# Patient Record
Sex: Male | Born: 1940 | Race: White | Hispanic: No | Marital: Married | Smoking: Former smoker
Health system: Southern US, Community
[De-identification: ages and names within clinical notes are randomized; demographics above are authoritative.]

## PROBLEM LIST (undated history)

## (undated) DIAGNOSIS — E785 Hyperlipidemia, unspecified: Secondary | ICD-10-CM

## (undated) DIAGNOSIS — I10 Essential (primary) hypertension: Secondary | ICD-10-CM

## (undated) DIAGNOSIS — I2510 Atherosclerotic heart disease of native coronary artery without angina pectoris: Secondary | ICD-10-CM

## (undated) DIAGNOSIS — M549 Dorsalgia, unspecified: Secondary | ICD-10-CM

## (undated) DIAGNOSIS — Z8719 Personal history of other diseases of the digestive system: Secondary | ICD-10-CM

## (undated) DIAGNOSIS — E119 Type 2 diabetes mellitus without complications: Secondary | ICD-10-CM

## (undated) DIAGNOSIS — K219 Gastro-esophageal reflux disease without esophagitis: Secondary | ICD-10-CM

## (undated) DIAGNOSIS — M1990 Unspecified osteoarthritis, unspecified site: Secondary | ICD-10-CM

## (undated) DIAGNOSIS — G4730 Sleep apnea, unspecified: Secondary | ICD-10-CM

## (undated) HISTORY — PX: COLONOSCOPY WITH ESOPHAGOGASTRODUODENOSCOPY (EGD): SHX5779

## (undated) HISTORY — PX: TOTAL KNEE ARTHROPLASTY: SHX125

## (undated) HISTORY — PX: CARDIAC CATHETERIZATION: SHX172

## (undated) HISTORY — PX: OTHER SURGICAL HISTORY: SHX169

## (undated) HISTORY — PX: TONSILLECTOMY: SUR1361

## (undated) HISTORY — PX: EYE SURGERY: SHX253

## (undated) HISTORY — PX: APPENDECTOMY: SHX54

## (undated) HISTORY — PX: NISSEN FUNDOPLICATION: SHX2091

## (undated) HISTORY — PX: CHOLECYSTECTOMY: SHX55

## (undated) HISTORY — PX: HERNIA REPAIR: SHX51

---

## 2005-10-06 ENCOUNTER — Ambulatory Visit: Payer: Self-pay | Admitting: Gastroenterology

## 2007-01-07 ENCOUNTER — Ambulatory Visit: Payer: Self-pay | Admitting: Internal Medicine

## 2007-01-17 ENCOUNTER — Inpatient Hospital Stay: Payer: Self-pay | Admitting: Cardiology

## 2007-01-17 ENCOUNTER — Other Ambulatory Visit: Payer: Self-pay

## 2007-02-16 ENCOUNTER — Ambulatory Visit: Payer: Self-pay | Admitting: Internal Medicine

## 2007-03-02 ENCOUNTER — Encounter: Payer: Self-pay | Admitting: Cardiology

## 2007-03-10 ENCOUNTER — Encounter: Payer: Self-pay | Admitting: Cardiology

## 2007-03-18 ENCOUNTER — Ambulatory Visit: Payer: Self-pay | Admitting: Gastroenterology

## 2007-04-10 ENCOUNTER — Encounter: Payer: Self-pay | Admitting: Cardiology

## 2007-05-10 ENCOUNTER — Encounter: Payer: Self-pay | Admitting: Cardiology

## 2007-06-10 ENCOUNTER — Encounter: Payer: Self-pay | Admitting: Cardiology

## 2008-08-29 ENCOUNTER — Ambulatory Visit: Payer: Self-pay | Admitting: Cardiology

## 2009-05-09 DEATH — deceased

## 2009-07-19 ENCOUNTER — Encounter: Payer: Self-pay | Admitting: Orthopedic Surgery

## 2009-08-09 ENCOUNTER — Encounter: Payer: Self-pay | Admitting: Orthopedic Surgery

## 2009-09-09 ENCOUNTER — Encounter: Payer: Self-pay | Admitting: Orthopedic Surgery

## 2010-09-08 ENCOUNTER — Encounter: Payer: Self-pay | Admitting: Orthopedic Surgery

## 2010-09-09 ENCOUNTER — Encounter: Payer: Self-pay | Admitting: Orthopedic Surgery

## 2010-10-09 ENCOUNTER — Encounter: Payer: Self-pay | Admitting: Orthopedic Surgery

## 2011-05-11 ENCOUNTER — Ambulatory Visit: Payer: Self-pay | Admitting: Gastroenterology

## 2012-05-30 ENCOUNTER — Ambulatory Visit: Payer: Self-pay | Admitting: Internal Medicine

## 2012-06-08 ENCOUNTER — Ambulatory Visit: Payer: Self-pay | Admitting: Surgery

## 2012-06-08 LAB — BASIC METABOLIC PANEL
Anion Gap: 3 — ABNORMAL LOW (ref 7–16)
Calcium, Total: 9.3 mg/dL (ref 8.5–10.1)
Co2: 35 mmol/L — ABNORMAL HIGH (ref 21–32)
Creatinine: 1.14 mg/dL (ref 0.60–1.30)
EGFR (African American): >60
EGFR (Non-African Amer.): >60
Sodium: 137 mmol/L (ref 136–145)

## 2012-06-09 ENCOUNTER — Ambulatory Visit: Payer: Self-pay | Admitting: Surgery

## 2012-06-20 ENCOUNTER — Emergency Department: Payer: Self-pay | Admitting: Emergency Medicine

## 2012-06-20 LAB — COMPREHENSIVE METABOLIC PANEL
Anion Gap: 9 (ref 7–16)
Bilirubin,Total: 1.5 mg/dL — ABNORMAL HIGH (ref 0.2–1.0)
Calcium, Total: 8.6 mg/dL (ref 8.5–10.1)
Chloride: 105 mmol/L (ref 98–107)
Co2: 27 mmol/L (ref 21–32)
EGFR (African American): >60
EGFR (Non-African Amer.): >60
Glucose: 215 mg/dL — ABNORMAL HIGH (ref 65–99)
Osmolality: 288 (ref 275–301)
Potassium: 3.8 mmol/L (ref 3.5–5.1)
SGOT(AST): 491 U/L — ABNORMAL HIGH (ref 15–37)
SGPT (ALT): 269 U/L — ABNORMAL HIGH (ref 12–78)
Sodium: 141 mmol/L (ref 136–145)

## 2012-06-20 LAB — CBC WITH DIFFERENTIAL/PLATELET
Basophil %: 1.4 %
Eosinophil %: 0.1 %
HCT: 38.8 % — ABNORMAL LOW (ref 40.0–52.0)
HGB: 13.4 g/dL (ref 13.0–18.0)
Lymphocyte #: 0.5 10*3/uL — ABNORMAL LOW (ref 1.0–3.6)
Lymphocyte %: 6.6 %
MCH: 34.8 pg — ABNORMAL HIGH (ref 26.0–34.0)
MCV: 101 fL — ABNORMAL HIGH (ref 80–100)
Monocyte #: 0.5 x10 3/mm (ref 0.2–1.0)
Neutrophil #: 5.9 10*3/uL (ref 1.4–6.5)
Platelet: 112 10*3/uL — ABNORMAL LOW (ref 150–440)

## 2012-06-20 LAB — LIPASE, BLOOD: Lipase: 277 U/L (ref 73–393)

## 2012-06-20 LAB — CBC
HCT: 40.2 % (ref 40.0–52.0)
HGB: 13.6 g/dL (ref 13.0–18.0)
MCH: 34.2 pg — ABNORMAL HIGH (ref 26.0–34.0)
MCHC: 33.9 g/dL (ref 32.0–36.0)
MCV: 101 fL — ABNORMAL HIGH (ref 80–100)
Platelet: 137 10*3/uL — ABNORMAL LOW (ref 150–440)
RBC: 3.99 10*6/uL — ABNORMAL LOW (ref 4.40–5.90)

## 2012-06-20 LAB — URINALYSIS, COMPLETE
Bilirubin,UR: NEGATIVE
Blood: NEGATIVE
Glucose,UR: 50 mg/dL (ref 0–75)
Nitrite: NEGATIVE
Ph: 5 (ref 4.5–8.0)
Specific Gravity: 1.023 (ref 1.003–1.030)
Squamous Epithelial: NONE SEEN

## 2012-06-20 LAB — TROPONIN I: Troponin-I: <0.02 ng/mL

## 2012-06-21 LAB — CK TOTAL AND CKMB (NOT AT ARMC): CK-MB: <0.5 ng/mL — ABNORMAL LOW (ref 0.5–3.6)

## 2012-06-21 LAB — TROPONIN I: Troponin-I: <0.02 ng/mL

## 2012-06-25 LAB — CULTURE, BLOOD (SINGLE)

## 2012-08-30 DIAGNOSIS — Z961 Presence of intraocular lens: Secondary | ICD-10-CM | POA: Insufficient documentation

## 2012-08-30 DIAGNOSIS — H04129 Dry eye syndrome of unspecified lacrimal gland: Secondary | ICD-10-CM | POA: Insufficient documentation

## 2013-01-27 DIAGNOSIS — Z96659 Presence of unspecified artificial knee joint: Secondary | ICD-10-CM | POA: Insufficient documentation

## 2013-04-18 ENCOUNTER — Emergency Department: Payer: Self-pay | Admitting: Internal Medicine

## 2013-04-18 LAB — PROTIME-INR
INR: 1
Prothrombin Time: 13 secs (ref 11.5–14.7)

## 2013-04-18 LAB — COMPREHENSIVE METABOLIC PANEL
Anion Gap: 6 — ABNORMAL LOW (ref 7–16)
BUN: 8 mg/dL (ref 7–18)
Calcium, Total: 8.6 mg/dL (ref 8.5–10.1)
Co2: 30 mmol/L (ref 21–32)
EGFR (African American): >60
EGFR (Non-African Amer.): >60
Osmolality: 283 (ref 275–301)
Potassium: 3.7 mmol/L (ref 3.5–5.1)
SGOT(AST): 24 U/L (ref 15–37)
SGPT (ALT): 29 U/L (ref 12–78)
Sodium: 141 mmol/L (ref 136–145)

## 2013-04-18 LAB — TROPONIN I: Troponin-I: <0.02 ng/mL

## 2013-04-18 LAB — CBC
MCH: 34.7 pg — ABNORMAL HIGH (ref 26.0–34.0)
MCHC: 34.7 g/dL (ref 32.0–36.0)
MCV: 100 fL (ref 80–100)
Platelet: 162 10*3/uL (ref 150–440)
RBC: 3.85 10*6/uL — ABNORMAL LOW (ref 4.40–5.90)
RDW: 14.2 % (ref 11.5–14.5)
WBC: 4.2 10*3/uL (ref 3.8–10.6)

## 2013-04-18 LAB — CK TOTAL AND CKMB (NOT AT ARMC): CK, Total: 118 U/L (ref 35–232)

## 2013-06-12 ENCOUNTER — Ambulatory Visit: Payer: Self-pay | Admitting: Internal Medicine

## 2013-07-10 ENCOUNTER — Ambulatory Visit: Payer: Self-pay | Admitting: Internal Medicine

## 2013-08-09 ENCOUNTER — Ambulatory Visit: Payer: Self-pay | Admitting: Internal Medicine

## 2014-03-06 DIAGNOSIS — H40003 Preglaucoma, unspecified, bilateral: Secondary | ICD-10-CM | POA: Insufficient documentation

## 2014-04-13 DIAGNOSIS — M545 Low back pain, unspecified: Secondary | ICD-10-CM | POA: Insufficient documentation

## 2014-04-20 DIAGNOSIS — I2510 Atherosclerotic heart disease of native coronary artery without angina pectoris: Secondary | ICD-10-CM | POA: Insufficient documentation

## 2014-05-10 DIAGNOSIS — H43399 Other vitreous opacities, unspecified eye: Secondary | ICD-10-CM | POA: Insufficient documentation

## 2014-05-12 DIAGNOSIS — I10 Essential (primary) hypertension: Secondary | ICD-10-CM | POA: Insufficient documentation

## 2014-05-12 DIAGNOSIS — K219 Gastro-esophageal reflux disease without esophagitis: Secondary | ICD-10-CM | POA: Insufficient documentation

## 2014-05-12 DIAGNOSIS — E1122 Type 2 diabetes mellitus with diabetic chronic kidney disease: Secondary | ICD-10-CM | POA: Insufficient documentation

## 2014-05-23 DIAGNOSIS — D510 Vitamin B12 deficiency anemia due to intrinsic factor deficiency: Secondary | ICD-10-CM | POA: Insufficient documentation

## 2014-09-09 DIAGNOSIS — I739 Peripheral vascular disease, unspecified: Secondary | ICD-10-CM | POA: Insufficient documentation

## 2014-09-09 DIAGNOSIS — I700 Atherosclerosis of aorta: Secondary | ICD-10-CM | POA: Insufficient documentation

## 2015-09-25 ENCOUNTER — Other Ambulatory Visit: Payer: Self-pay | Admitting: Internal Medicine

## 2015-09-25 DIAGNOSIS — R0989 Other specified symptoms and signs involving the circulatory and respiratory systems: Secondary | ICD-10-CM

## 2015-09-30 ENCOUNTER — Ambulatory Visit
Admission: RE | Admit: 2015-09-30 | Discharge: 2015-09-30 | Disposition: A | Discharge status: Home / Self Care | Payer: Medicare Other | Source: Ambulatory Visit | Attending: Internal Medicine | Admitting: Internal Medicine

## 2015-09-30 DIAGNOSIS — R0989 Other specified symptoms and signs involving the circulatory and respiratory systems: Secondary | ICD-10-CM | POA: Diagnosis present

## 2015-09-30 DIAGNOSIS — I6523 Occlusion and stenosis of bilateral carotid arteries: Secondary | ICD-10-CM | POA: Insufficient documentation

## 2016-01-23 DIAGNOSIS — Z0000 Encounter for general adult medical examination without abnormal findings: Secondary | ICD-10-CM | POA: Insufficient documentation

## 2016-03-13 ENCOUNTER — Encounter: Payer: Self-pay | Admitting: *Deleted

## 2016-03-16 ENCOUNTER — Encounter: Payer: Self-pay | Admitting: *Deleted

## 2016-03-16 ENCOUNTER — Encounter
Admission: RE | Disposition: A | Discharge status: Home / Self Care | Payer: Self-pay | Source: Ambulatory Visit | Attending: Gastroenterology

## 2016-03-16 ENCOUNTER — Ambulatory Visit
Admission: RE | Admit: 2016-03-16 | Discharge: 2016-03-16 | Disposition: A | Discharge status: Home / Self Care | Payer: Medicare Other | Source: Ambulatory Visit | Attending: Gastroenterology | Admitting: Gastroenterology

## 2016-03-16 ENCOUNTER — Ambulatory Visit: Payer: Medicare Other | Admitting: Anesthesiology

## 2016-03-16 DIAGNOSIS — I2510 Atherosclerotic heart disease of native coronary artery without angina pectoris: Secondary | ICD-10-CM | POA: Insufficient documentation

## 2016-03-16 DIAGNOSIS — Z8371 Family history of colonic polyps: Secondary | ICD-10-CM | POA: Diagnosis present

## 2016-03-16 DIAGNOSIS — Z7902 Long term (current) use of antithrombotics/antiplatelets: Secondary | ICD-10-CM | POA: Insufficient documentation

## 2016-03-16 DIAGNOSIS — E119 Type 2 diabetes mellitus without complications: Secondary | ICD-10-CM | POA: Diagnosis not present

## 2016-03-16 DIAGNOSIS — Z7984 Long term (current) use of oral hypoglycemic drugs: Secondary | ICD-10-CM | POA: Diagnosis not present

## 2016-03-16 DIAGNOSIS — K621 Rectal polyp: Secondary | ICD-10-CM | POA: Insufficient documentation

## 2016-03-16 DIAGNOSIS — Z1211 Encounter for screening for malignant neoplasm of colon: Secondary | ICD-10-CM | POA: Insufficient documentation

## 2016-03-16 DIAGNOSIS — E785 Hyperlipidemia, unspecified: Secondary | ICD-10-CM | POA: Diagnosis not present

## 2016-03-16 DIAGNOSIS — G4730 Sleep apnea, unspecified: Secondary | ICD-10-CM | POA: Insufficient documentation

## 2016-03-16 DIAGNOSIS — Z7982 Long term (current) use of aspirin: Secondary | ICD-10-CM | POA: Diagnosis not present

## 2016-03-16 DIAGNOSIS — K5730 Diverticulosis of large intestine without perforation or abscess without bleeding: Secondary | ICD-10-CM | POA: Insufficient documentation

## 2016-03-16 DIAGNOSIS — M1990 Unspecified osteoarthritis, unspecified site: Secondary | ICD-10-CM | POA: Diagnosis not present

## 2016-03-16 DIAGNOSIS — Z79899 Other long term (current) drug therapy: Secondary | ICD-10-CM | POA: Diagnosis not present

## 2016-03-16 DIAGNOSIS — K640 First degree hemorrhoids: Secondary | ICD-10-CM | POA: Diagnosis not present

## 2016-03-16 DIAGNOSIS — K449 Diaphragmatic hernia without obstruction or gangrene: Secondary | ICD-10-CM | POA: Insufficient documentation

## 2016-03-16 DIAGNOSIS — Z79891 Long term (current) use of opiate analgesic: Secondary | ICD-10-CM | POA: Insufficient documentation

## 2016-03-16 DIAGNOSIS — K219 Gastro-esophageal reflux disease without esophagitis: Secondary | ICD-10-CM | POA: Diagnosis not present

## 2016-03-16 DIAGNOSIS — I10 Essential (primary) hypertension: Secondary | ICD-10-CM | POA: Diagnosis not present

## 2016-03-16 HISTORY — DX: Dorsalgia, unspecified: M54.9

## 2016-03-16 HISTORY — PX: COLONOSCOPY WITH PROPOFOL: SHX5780

## 2016-03-16 HISTORY — DX: Hyperlipidemia, unspecified: E78.5

## 2016-03-16 HISTORY — DX: Essential (primary) hypertension: I10

## 2016-03-16 HISTORY — DX: Sleep apnea, unspecified: G47.30

## 2016-03-16 HISTORY — DX: Unspecified osteoarthritis, unspecified site: M19.90

## 2016-03-16 HISTORY — DX: Personal history of other diseases of the digestive system: Z87.19

## 2016-03-16 HISTORY — DX: Gastro-esophageal reflux disease without esophagitis: K21.9

## 2016-03-16 HISTORY — DX: Type 2 diabetes mellitus without complications: E11.9

## 2016-03-16 HISTORY — DX: Atherosclerotic heart disease of native coronary artery without angina pectoris: I25.10

## 2016-03-16 LAB — GLUCOSE, CAPILLARY: Glucose-Capillary: 98 mg/dL (ref 65–99)

## 2016-03-16 SURGERY — COLONOSCOPY WITH PROPOFOL
Anesthesia: General

## 2016-03-16 MED ORDER — SODIUM CHLORIDE 0.9 % IV SOLN
INTRAVENOUS | Status: DC
  Administered 2016-03-16: 14:00:00 via INTRAVENOUS

## 2016-03-16 MED ORDER — SODIUM CHLORIDE 0.9 % IV SOLN
INTRAVENOUS | Status: DC
  Administered 2016-03-16: 13:00:00 via INTRAVENOUS

## 2016-03-16 MED ORDER — PROPOFOL 10 MG/ML IV BOLUS
INTRAVENOUS | Status: DC | PRN
  Administered 2016-03-16: 60 mg via INTRAVENOUS

## 2016-03-16 MED ORDER — PROPOFOL 500 MG/50ML IV EMUL
INTRAVENOUS | Status: DC | PRN
  Administered 2016-03-16: 140 ug/kg/min via INTRAVENOUS

## 2016-03-16 MED ORDER — EPHEDRINE SULFATE 50 MG/ML IJ SOLN
INTRAMUSCULAR | Status: DC | PRN
  Administered 2016-03-16 (×2): 10 mg via INTRAVENOUS

## 2016-03-16 MED ORDER — SODIUM CHLORIDE 0.9 % IV SOLN: INTRAVENOUS | Status: DC

## 2016-03-16 MED ORDER — ONDANSETRON HCL 4 MG/2ML IJ SOLN
INTRAMUSCULAR | Status: DC | PRN
  Administered 2016-03-16: 4 mg via INTRAVENOUS

## 2016-03-16 MED ORDER — LIDOCAINE HCL (CARDIAC) 20 MG/ML IV SOLN
INTRAVENOUS | Status: DC | PRN
  Administered 2016-03-16: 40 mg via INTRAVENOUS

## 2016-03-16 NOTE — Anesthesia Procedure Notes (Signed)
Date/Time: 03/16/2016 1:50 PM Performed by: Henrietta HooverPOPE, Lemoyne Scarpati Pre-anesthesia Checklist: Patient identified, Emergency Drugs available, Suction available, Patient being monitored and Timeout performed Oxygen Delivery Method: Nasal cannula

## 2016-03-16 NOTE — Anesthesia Preprocedure Evaluation (Signed)
Anesthesia Evaluation  Patient identified by MRN, date of birth, ID band Patient awake    Reviewed: Allergy & Precautions, H&P , NPO status , Patient's Chart, lab work & pertinent test results  History of Anesthesia Complications Negative for: history of anesthetic complications  Airway Mallampati: III  TM Distance: >3 FB Neck ROM: limited    Dental  (+) Poor Dentition, Missing, Edentulous Upper, Edentulous Lower   Pulmonary neg shortness of breath, sleep apnea , former smoker,    Pulmonary exam normal breath sounds clear to auscultation       Cardiovascular Exercise Tolerance: Good hypertension, (-) angina+ CAD and + Cardiac Stents  (-) PND Normal cardiovascular exam Rhythm:regular Rate:Normal     Neuro/Psych negative neurological ROS  negative psych ROS   GI/Hepatic Neg liver ROS, hiatal hernia, GERD  Controlled,  Endo/Other  diabetes, Type 2  Renal/GU negative Renal ROS  negative genitourinary   Musculoskeletal  (+) Arthritis ,   Abdominal   Peds  Hematology negative hematology ROS (+)   Anesthesia Other Findings Past Medical History:   Arthritis                                                    Back pain                                                    Coronary artery disease                                      Diabetes mellitus without complication (HCC)                 GERD (gastroesophageal reflux disease)                       History of hiatal hernia                                     Hyperlipidemia                                               Hypertension                                                 Sleep apnea                                                 Past Surgical History:   EYE SURGERY  TOTAL KNEE ARTHROPLASTY                                       TONSILLECTOMY                                                 APPENDECTOMY                                                   CARDIAC CATHETERIZATION                                       HERNIA REPAIR                                                 testicular vein surgery                                       CHOLECYSTECTOMY                                               NISSEN FUNDOPLICATION                                         COLONOSCOPY WITH ESOPHAGOGASTRODUODENOSCOPY (E*                 Reproductive/Obstetrics negative OB ROS                             Anesthesia Physical Anesthesia Plan  ASA: III  Anesthesia Plan: General   Post-op Pain Management:    Induction:   Airway Management Planned:   Additional Equipment:   Intra-op Plan:   Post-operative Plan:   Informed Consent: I have reviewed the patients History and Physical, chart, labs and discussed the procedure including the risks, benefits and alternatives for the proposed anesthesia with the patient or authorized representative who has indicated his/her understanding and acceptance.   Dental Advisory Given  Plan Discussed with: Anesthesiologist, CRNA and Surgeon  Anesthesia Plan Comments:         Anesthesia Quick Evaluation

## 2016-03-16 NOTE — H&P (Signed)
Outpatient short stay form Pre-procedure 03/16/2016 1:32 PM Bobby Deem MD  Primary Physician: Dr. Einar Crow  Reason for visit:  Colonoscopy  History of present illness:  Patient is a 75 year old male presenting today for colonoscopy. There is a family history of colon polyps and a primary relative. He tolerated his prep well. He does take aspirin and Plavix both of which she has held for 5 days or more.    Current facility-administered medications:  .  0.9 %  sodium chloride infusion, , Intravenous, Continuous, Bobby Deem, MD .  0.9 %  sodium chloride infusion, , Intravenous, Continuous, Bobby Deem, MD, Last Rate: 20 mL/hr at 03/16/16 1309 .  0.9 %  sodium chloride infusion, , Intravenous, Continuous, Bobby Deem, MD  Prescriptions prior to admission  Medication Sig Dispense Refill Last Dose  . acetaminophen (TYLENOL) 500 MG tablet Take 1,000 mg by mouth every 6 (six) hours as needed.   03/15/2016 at Unknown time  . aspirin 81 MG tablet Take 81 mg by mouth daily.   03/15/2016 at Unknown time  . b complex vitamins capsule Take 1 capsule by mouth daily.   03/15/2016 at Unknown time  . Calcium Carbonate-Vitamin D 500-125 MG-UNIT TABS Take by mouth.   03/15/2016 at Unknown time  . clopidogrel (PLAVIX) 75 MG tablet Take 75 mg by mouth daily.   Past Week at Unknown time  . HYDROcodone-acetaminophen (NORCO) 7.5-325 MG tablet Take 1 tablet by mouth every 6 (six) hours as needed for moderate pain.   Past Week at Unknown time  . loratadine (CLARITIN) 10 MG tablet Take 10 mg by mouth daily.   03/15/2016 at Unknown time  . metaxalone (SKELAXIN) 800 MG tablet Take 800 mg by mouth 3 (three) times daily.   03/15/2016 at Unknown time  . metFORMIN (GLUCOPHAGE XR) 500 MG 24 hr tablet Take 500 mg by mouth daily with breakfast.   03/15/2016 at Unknown time  . methocarbamol (ROBAXIN) 500 MG tablet Take 500 mg by mouth 4 (four) times daily.   Past Month at Unknown time  . metoprolol  succinate (TOPROL-XL) 100 MG 24 hr tablet Take 100 mg by mouth daily. Take with or immediately following a meal.   03/16/2016 at 0730  . omeprazole (PRILOSEC) 20 MG capsule Take 20 mg by mouth daily.   03/15/2016 at Unknown time  . rosuvastatin (CRESTOR) 10 MG tablet Take 10 mg by mouth daily.   03/15/2016 at Unknown time  . vitamin B-12 (CYANOCOBALAMIN) 1000 MCG tablet Take 1,000 mcg by mouth daily.   Past Month at Unknown time     Allergies  Allergen Reactions  . Nsaids      Past Medical History  Diagnosis Date  . Arthritis   . Back pain   . Coronary artery disease   . Diabetes mellitus without complication (HCC)   . GERD (gastroesophageal reflux disease)   . History of hiatal hernia   . Hyperlipidemia   . Hypertension   . Sleep apnea     Review of systems:      Physical Exam    Heart and lungs: Regular rate and rhythm without rub or gallop, lungs are bilaterally clear.    HEENT: Normocephalic atraumatic eyes are anicteric    Other:     Pertinant exam for procedure: Soft nontender nondistended bowel sounds positive normoactive.    Planned proceedures: Colonoscopy and indicated procedures. I have discussed the risks benefits and complications of procedures to include not limited to  bleeding, infection, perforation and the risk of sedation and the patient wishes to proceed.    Bobby DeemMartin U Donaven Criswell, MD Gastroenterology 03/16/2016  1:32 PM

## 2016-03-16 NOTE — Transfer of Care (Signed)
Immediate Anesthesia Transfer of Care Note  Patient: Bobby GuestWilliam L Penalver [name]  Procedure(s) Performed: Procedure(s): COLONOSCOPY WITH PROPOFOL (N/A)  Patient Location: PACU  Anesthesia Type:General  Level of Consciousness: sedated  Airway & Oxygen Therapy: Patient Spontanous Breathing and Patient connected to nasal cannula oxygen  Post-op Assessment: Report given to RN and Post -op Vital signs reviewed and stable  Post vital signs: Reviewed and stable  Last Vitals:  Filed Vitals:   03/16/16 1251  BP: 135/78  Pulse: 57  Temp: 36 C  Resp: 20    Last Pain: There were no vitals filed for this visit.       Complications: No apparent anesthesia complications

## 2016-03-16 NOTE — Op Note (Signed)
[facility] Patient Name: Bobby Warner Procedure Date: 03/16/2016 1:46 PM MRN: [id number] Account #: [id number] Date of Birth: 09-26-1941 Admit Type: Outpatient Age: 75 Room: [facility] ENDO ROOM 3 Gender: Male Note Status: Finalized Procedure:            Colonoscopy Indications:          Family history of colonic polyps in a first-degree                        relative Providers:            [name], MD Referring MD:         Marya Amsler. Dareen Piano, MD (Referring MD) Medicines:            Monitored Anesthesia Care Complications:        No immediate complications. Procedure:            Pre-Anesthesia Assessment:                       - ASA Grade Assessment: III - A patient with severe                        systemic disease.                       After obtaining informed consent, the colonoscope was                        passed under direct vision. Throughout the procedure,                        the patient's blood pressure, pulse, and oxygen                        saturations were monitored continuously. The                        Colonoscope was introduced through the anus and                        advanced to the the cecum, identified by appendiceal                        orifice and ileocecal valve. The colonoscopy was                        performed without difficulty. The patient tolerated the                        procedure well. The quality of the bowel preparation                        was good. Findings:      A few small-mouthed diverticula were found in the sigmoid colon and       descending colon.      A less than 1 mm polyp was found in the rectum. The polyp was sessile.       The polyp was removed with a cold biopsy forceps. Resection and       retrieval were complete.      The digital rectal exam was normal.  Non-bleeding internal hemorrhoids were found during retroflexion. The       hemorrhoids were small and Grade  I (internal hemorrhoids that do not       prolapse).      No additional abnormalities were found on retroflexion. Impression:           - Diverticulosis in the sigmoid colon and in the                        descending colon.                       - One less than 1 mm polyp in the rectum, removed with                        a cold biopsy forceps. Resected and retrieved.                       - Non-bleeding internal hemorrhoids. Recommendation:       - Discharge patient to home. Procedure Code(s):    --- Professional ---                       [phone], Colonoscopy, flexible; with biopsy, single or                        multiple Diagnosis Code(s):    --- Professional ---                       K64.0, First degree hemorrhoids                       K62.1, Rectal polyp                       Z83.71, Family history of colonic polyps                       K57.30, Diverticulosis of large intestine without                        perforation or abscess without bleeding CPT copyright 2016 [employer]. All rights reserved. The codes documented in this report are preliminary and upon coder review may  be revised to meet current compliance requirements. Christena DeemMartin U Mardel Grudzien, MD 03/16/2016 2:20:34 PM This report has been signed electronically. Number of Addenda: 0 Note Initiated On: 03/16/2016 1:46 PM Scope Withdrawal Time: 0 hours 8 minutes 2 seconds  Total Procedure Duration: 0 hours 21 minutes 41 seconds       [facility]

## 2016-03-17 ENCOUNTER — Encounter: Payer: Self-pay | Admitting: Gastroenterology

## 2016-03-17 NOTE — Anesthesia Postprocedure Evaluation (Signed)
Anesthesia Post Note  Patient: Bobby GuestWilliam L Stelzer [name]  Procedure(s) Performed: Procedure(s) (LRB): COLONOSCOPY WITH PROPOFOL (N/A)  Patient location during evaluation: Endoscopy Anesthesia Type: General Level of consciousness: awake and alert Pain management: pain level controlled Vital Signs Assessment: post-procedure vital signs reviewed and stable Respiratory status: spontaneous breathing, nonlabored ventilation, respiratory function stable and patient connected to nasal cannula oxygen Cardiovascular status: blood pressure returned to baseline and stable Postop Assessment: no signs of nausea or vomiting Anesthetic complications: no    Last Vitals:  Filed Vitals:   03/16/16 1450 03/16/16 1500  BP: 137/69 140/70  Pulse: 68 63  Temp:    Resp: 14 17    Last Pain: There were no vitals filed for this visit.               Cleda MccreedyJoseph K Piscitello

## 2016-03-18 LAB — SURGICAL PATHOLOGY

## 2016-08-09 ENCOUNTER — Encounter: Payer: Self-pay | Admitting: Emergency Medicine

## 2016-08-09 ENCOUNTER — Emergency Department
Admission: EM | Admit: 2016-08-09 | Discharge: 2016-08-09 | Disposition: A | Discharge status: Home / Self Care | Payer: Medicare Other | Attending: Student | Admitting: Student

## 2016-08-09 DIAGNOSIS — Z7984 Long term (current) use of oral hypoglycemic drugs: Secondary | ICD-10-CM | POA: Diagnosis not present

## 2016-08-09 DIAGNOSIS — X58XXXA Exposure to other specified factors, initial encounter: Secondary | ICD-10-CM | POA: Insufficient documentation

## 2016-08-09 DIAGNOSIS — S0083XA Contusion of other part of head, initial encounter: Secondary | ICD-10-CM | POA: Diagnosis not present

## 2016-08-09 DIAGNOSIS — I10 Essential (primary) hypertension: Secondary | ICD-10-CM | POA: Diagnosis not present

## 2016-08-09 DIAGNOSIS — Y929 Unspecified place or not applicable: Secondary | ICD-10-CM | POA: Diagnosis not present

## 2016-08-09 DIAGNOSIS — Y999 Unspecified external cause status: Secondary | ICD-10-CM | POA: Insufficient documentation

## 2016-08-09 DIAGNOSIS — S0011XA Contusion of right eyelid and periocular area, initial encounter: Secondary | ICD-10-CM | POA: Diagnosis present

## 2016-08-09 DIAGNOSIS — Z87891 Personal history of nicotine dependence: Secondary | ICD-10-CM | POA: Insufficient documentation

## 2016-08-09 DIAGNOSIS — Z79899 Other long term (current) drug therapy: Secondary | ICD-10-CM | POA: Diagnosis not present

## 2016-08-09 DIAGNOSIS — Y939 Activity, unspecified: Secondary | ICD-10-CM | POA: Diagnosis not present

## 2016-08-09 DIAGNOSIS — E119 Type 2 diabetes mellitus without complications: Secondary | ICD-10-CM | POA: Diagnosis not present

## 2016-08-09 DIAGNOSIS — Z7982 Long term (current) use of aspirin: Secondary | ICD-10-CM | POA: Diagnosis not present

## 2016-08-09 DIAGNOSIS — I2510 Atherosclerotic heart disease of native coronary artery without angina pectoris: Secondary | ICD-10-CM | POA: Diagnosis not present

## 2016-08-09 NOTE — ED Triage Notes (Signed)
Pt woke up this am with what looks like a vessel rupture corner of right eye. Pt takes Plavix.

## 2016-08-09 NOTE — ED Provider Notes (Signed)
[facility] Emergency Department Provider Note  ____________________________________________  Time seen: Approximately 2:04 PM  I have reviewed the triage vital signs and the nursing notes.   HISTORY  Chief Complaint Eye Problem    HPI Bobby HarpsWilliam L Wanner [name] is a 75 y.o. male , NAD, presents to the emergency department, and by his wife who assists with history. States he woke this morning with a bruise below the right eye. Denies any injury, trauma, fall. Has not had any changes in vision. Denies any bleeding, discharge or redness of the globe of the right eye. States he takes daily aspirin and Plavix as he has a cardiac stent but has no history of stroke or heart attack. Denies any chest pain, shortness breath, visual changes, loss of vision. Denies numbness, weakness, tingling. No pain about the bruised area unless he applies pressure. No recent surgeries or procedures.   Past Medical History:  Diagnosis Date  . Arthritis   . Back pain   . Coronary artery disease   . Diabetes mellitus without complication (HCC)   . GERD (gastroesophageal reflux disease)   . History of hiatal hernia   . Hyperlipidemia   . Hypertension   . Sleep apnea     There are no active problems to display for this patient.   Past Surgical History:  Procedure Laterality Date  . APPENDECTOMY    . CARDIAC CATHETERIZATION    . CHOLECYSTECTOMY    . COLONOSCOPY WITH ESOPHAGOGASTRODUODENOSCOPY (EGD)    . COLONOSCOPY WITH PROPOFOL N/A 03/16/2016   Procedure: COLONOSCOPY WITH PROPOFOL;  Surgeon: Christena DeemMartin U Skulskie, MD;  [facility] END[facility];  Service: Endoscopy;  Laterality: N/A;  . EYE SURGERY    . HERNIA REPAIR    . NISSEN FUNDOPLICATION    . testicular vein surgery    . TONSILLECTOMY    . TOTAL KNEE ARTHROPLASTY      Prior to Admission medications   Medication Sig Start Date End Date Taking? Authorizing Provider  acetaminophen (TYLENOL) 500 MG tablet Take 1,000 mg by mouth  every 6 (six) hours as needed.    Historical Provider, MD  aspirin 81 MG tablet Take 81 mg by mouth daily.    Historical Provider, MD  b complex vitamins capsule Take 1 capsule by mouth daily.    Historical Provider, MD  Calcium Carbonate-Vitamin D 500-125 MG-UNIT TABS Take by mouth.    Historical Provider, MD  clopidogrel (PLAVIX) 75 MG tablet Take 75 mg by mouth daily.    Historical Provider, MD  HYDROcodone-acetaminophen (NORCO) 7.5-325 MG tablet Take 1 tablet by mouth every 6 (six) hours as needed for moderate pain.    Historical Provider, MD  loratadine (CLARITIN) 10 MG tablet Take 10 mg by mouth daily.    Historical Provider, MD  metaxalone (SKELAXIN) 800 MG tablet Take 800 mg by mouth 3 (three) times daily.    Historical Provider, MD  metFORMIN (GLUCOPHAGE XR) 500 MG 24 hr tablet Take 500 mg by mouth daily with breakfast.    Historical Provider, MD  methocarbamol (ROBAXIN) 500 MG tablet Take 500 mg by mouth 4 (four) times daily.    Historical Provider, MD  metoprolol succinate (TOPROL-XL) 100 MG 24 hr tablet Take 100 mg by mouth daily. Take with or immediately following a meal.    Historical Provider, MD  omeprazole (PRILOSEC) 20 MG capsule Take 20 mg by mouth daily.    Historical Provider, MD  rosuvastatin (CRESTOR) 10 MG tablet Take 10 mg by mouth daily.  Historical Provider, MD  vitamin B-12 (CYANOCOBALAMIN) 1000 MCG tablet Take 1,000 mcg by mouth daily.    Historical Provider, MD    Allergies Nsaids  History reviewed. No pertinent family history.  Social History Social History  Substance Use Topics  . Smoking status: Former Games developer  . Smokeless tobacco: Never Used  . Alcohol use 0.6 oz/week    1 Cans of beer per week     Review of Systems Constitutional: No fever/chills Eyes: No visual changes.No discharge, redness or pain Cardiovascular: No chest pain. Respiratory: No shortness of breath.  Musculoskeletal: Negative for facial pain.  Skin: Positive bruising under  right eye. Negative for rash, redness, swelling, skin sores. Neurological: Negative for numbness, weakness, tingling. 10-point ROS otherwise negative.  ____________________________________________   PHYSICAL EXAM:  VITAL SIGNS: ED Triage Vitals [08/09/16 1338]  Enc Vitals Group     BP 129/67     Pulse Rate 60     Resp      Temp 98 F (36.7 C)     Temp Source Oral     SpO2 98 %     Weight 183 lb (83 kg)     Height 5\' 9"  (1.753 m)     Head Circumference      Peak Flow      Pain Score      Pain Loc      Pain Edu?      Excl. in GC?      Constitutional: Alert and oriented. Well appearing and in no acute distress. Eyes: Conjunctivae are normal without icterus, injection or hemorrhage. PERRLA. EOMI without pain.  Head: Normocephalic. Neck: Supple with full range of motion. Hematological/Lymphatic/Immunilogical: No cervical lymphadenopathy. Cardiovascular:  Good peripheral circulation. Respiratory: Normal respiratory effort without tachypnea or retractions.  Neurologic:  Normal speech and language. No gross focal neurologic deficits are appreciated.  Skin:  Purple ecchymosis noted about the medial portion of the lower eyelid and right upper cheek. No active bleeding, oozing or weeping. No skin sores or lacerations. Skin is warm, dry and intact. No rash noted. Psychiatric: Mood and affect are normal. Speech and behavior are normal. Patient exhibits appropriate insight and judgement.   ____________________________________________   LABS  None ____________________________________________  EKG  None ____________________________________________  RADIOLOGY  None ____________________________________________    PROCEDURES  Procedure(s) performed: None   Procedures   Medications - No data to display   ____________________________________________   INITIAL IMPRESSION / ASSESSMENT AND PLAN / ED COURSE  Pertinent labs & imaging results that were available  during my care of the patient were reviewed by me and considered in my medical decision making (see chart for details).  Clinical Course    Patient's diagnosis is consistent with Contusion of face. Patient will be discharged home with instructions to apply ice to the affected area 20 minutes 3-4 times daily as needed. Patient is to follow up with his primary care provider or ophthalmologist if symptoms persist past this treatment course. Patient is given ED precautions to return to the ED for any worsening or new symptoms.    ____________________________________________  FINAL CLINICAL IMPRESSION(S) / ED DIAGNOSES  Final diagnoses:  Contusion of face, initial encounter      NEW MEDICATIONS STARTED DURING THIS VISIT:  Discharge Medication List as of 08/09/2016  2:11 PM           Hope Pigeon, PA-C 08/09/16 1513    Gayla Doss, MD 08/09/16 1552

## 2017-08-31 DIAGNOSIS — N183 Chronic kidney disease, stage 3 unspecified: Secondary | ICD-10-CM | POA: Insufficient documentation

## 2017-08-31 DIAGNOSIS — N138 Other obstructive and reflux uropathy: Secondary | ICD-10-CM | POA: Insufficient documentation

## 2017-08-31 DIAGNOSIS — R339 Retention of urine, unspecified: Secondary | ICD-10-CM | POA: Insufficient documentation

## 2017-11-13 ENCOUNTER — Other Ambulatory Visit: Payer: Self-pay

## 2017-11-13 ENCOUNTER — Emergency Department
Admission: EM | Admit: 2017-11-13 | Discharge: 2017-11-13 | Disposition: A | Discharge status: Home / Self Care | Payer: Medicare Other | Attending: Emergency Medicine | Admitting: Emergency Medicine

## 2017-11-13 DIAGNOSIS — I2510 Atherosclerotic heart disease of native coronary artery without angina pectoris: Secondary | ICD-10-CM | POA: Insufficient documentation

## 2017-11-13 DIAGNOSIS — Z79899 Other long term (current) drug therapy: Secondary | ICD-10-CM | POA: Insufficient documentation

## 2017-11-13 DIAGNOSIS — Z7901 Long term (current) use of anticoagulants: Secondary | ICD-10-CM | POA: Insufficient documentation

## 2017-11-13 DIAGNOSIS — I10 Essential (primary) hypertension: Secondary | ICD-10-CM | POA: Diagnosis not present

## 2017-11-13 DIAGNOSIS — Z96659 Presence of unspecified artificial knee joint: Secondary | ICD-10-CM | POA: Insufficient documentation

## 2017-11-13 DIAGNOSIS — Z7982 Long term (current) use of aspirin: Secondary | ICD-10-CM | POA: Diagnosis not present

## 2017-11-13 DIAGNOSIS — E119 Type 2 diabetes mellitus without complications: Secondary | ICD-10-CM | POA: Diagnosis not present

## 2017-11-13 DIAGNOSIS — Z7984 Long term (current) use of oral hypoglycemic drugs: Secondary | ICD-10-CM | POA: Diagnosis not present

## 2017-11-13 DIAGNOSIS — K5900 Constipation, unspecified: Secondary | ICD-10-CM

## 2017-11-13 DIAGNOSIS — Z87891 Personal history of nicotine dependence: Secondary | ICD-10-CM | POA: Diagnosis not present

## 2017-11-13 MED ORDER — MAGNESIUM CITRATE PO SOLN
0.5000 | Freq: Once | ORAL | Status: AC
Start: 2017-11-13 — End: 2017-11-13
  Administered 2017-11-13: 0.5
  Filled 2017-11-13: qty 296

## 2017-11-13 MED ORDER — FENTANYL CITRATE (PF) 100 MCG/2ML IJ SOLN: 100.0000 ug | Freq: Once | INTRAMUSCULAR | Status: DC

## 2017-11-13 MED ORDER — LACTULOSE 10 GM/15ML PO SOLN
10.0000 g | Freq: Once | ORAL | Status: AC
  Administered 2017-11-13: 10 g via ORAL
  Filled 2017-11-13: qty 30

## 2017-11-13 MED ORDER — KETOROLAC TROMETHAMINE 30 MG/ML IJ SOLN: 30.0000 mg | Freq: Once | INTRAMUSCULAR | Status: DC

## 2017-11-13 MED ORDER — DOCUSATE SODIUM 50 MG/5ML PO LIQD
100.0000 mg | Freq: Once | ORAL | Status: AC
  Administered 2017-11-13: 100 mg via ORAL
  Filled 2017-11-13: qty 10

## 2017-11-13 NOTE — ED Notes (Signed)
This tech got pt up on bedside commode after rn administer 1500 ml enema. Pt got up with little distress.

## 2017-11-13 NOTE — ED Triage Notes (Signed)
Pt c/o a lot of rectal pressure and pain unable to have a BM for several days, states he has been taking a stool softener with no relief.

## 2017-11-13 NOTE — ED Provider Notes (Signed)
[facility] Emergency Department Provider Note  ____________________________________________   First MD Initiated Contact with Patient 11/13/17 1059     (approximate)  I have reviewed the triage vital signs and the nursing notes.   HISTORY  Chief Complaint Constipation   HPI Bobby Warner. is a 77 y.o. male with a history of coronary artery disease as well as diabetes who is presenting to the emergency department constipation.  He says he has not moved his bowels in 4 days and usually moves his bowels every day.  He says that he does not drink as much water as he would like.  Says that he has been trying a stool softener that he was using over-the-counter over the past several days without results.  Says that he is still able to pass gas.  Denies any abdominal pain but says that he is starting to feels uncomfortable.  Past Medical History:  Diagnosis Date  . Arthritis   . Back pain   . Coronary artery disease   . Diabetes mellitus without complication (HCC)   . GERD (gastroesophageal reflux disease)   . History of hiatal hernia   . Hyperlipidemia   . Hypertension   . Sleep apnea     There are no active problems to display for this patient.   Past Surgical History:  Procedure Laterality Date  . APPENDECTOMY    . CARDIAC CATHETERIZATION    . CHOLECYSTECTOMY    . COLONOSCOPY WITH ESOPHAGOGASTRODUODENOSCOPY (EGD)    . COLONOSCOPY WITH PROPOFOL N/A 03/16/2016   Procedure: COLONOSCOPY WITH PROPOFOL;  Surgeon: [name], MD;  Location: [facility] ENDOSCOPY;  Service: Endoscopy;  Laterality: N/A;  . EYE SURGERY    . HERNIA REPAIR    . NISSEN FUNDOPLICATION    . testicular vein surgery    . TONSILLECTOMY    . TOTAL KNEE ARTHROPLASTY      Prior to Admission medications   Medication Sig Start Date End Date Taking? Authorizing Provider  acetaminophen (TYLENOL) 500 MG tablet Take 1,000 mg by mouth every 6 (six) hours as needed.   Yes [provider]  aspirin 81 MG tablet Take 81 mg by mouth daily.   Yes [provider]  b complex vitamins capsule Take 1 capsule by mouth daily.   Yes [provider]  Calcium Carbonate-Vitamin D 500-125 MG-UNIT TABS Take 1 tablet by mouth daily.    Yes [provider]  clopidogrel (PLAVIX) 75 MG tablet Take 75 mg by mouth daily.   Yes [provider]  loratadine (CLARITIN) 10 MG tablet Take 10 mg by mouth daily.   Yes [provider]  metaxalone (SKELAXIN) 800 MG tablet Take 800 mg by mouth daily.    Yes [provider]  metFORMIN (GLUCOPHAGE XR) 500 MG 24 hr tablet Take 500 mg by mouth daily with breakfast.   Yes [provider]  methocarbamol (ROBAXIN) 500 MG tablet Take 500 mg by mouth daily.    Yes [provider]  metoprolol succinate (TOPROL-XL) 100 MG 24 hr tablet Take 100 mg by mouth daily. Take with or immediately following a meal.   Yes [provider]  omeprazole (PRILOSEC) 20 MG capsule Take 20 mg by mouth daily.   Yes [provider]  rosuvastatin (CRESTOR) 10 MG tablet Take 10 mg by mouth daily.   Yes [provider]  vitamin B-12 (CYANOCOBALAMIN) 1000 MCG tablet Take 1,000 mcg by mouth daily.   Yes [provider]    Allergies Nsaids  No family history on file.  Social History Social History   Tobacco Use  . Smoking status: Former Games developer  . Smokeless tobacco: Never Used  Substance Use Topics  . Alcohol use: Yes    Alcohol/week: 0.6 oz    Types: 1 Cans of beer per week  . Drug use: No    Review of Systems  Constitutional: No fever/chills Eyes: No visual changes. ENT: No sore throat. Cardiovascular: Denies chest pain. Respiratory: Denies shortness of breath. Gastrointestinal: No abdominal pain.  No nausea, no vomiting.  No diarrhea.  Genitourinary: Negative for dysuria. Musculoskeletal: Negative for back pain. Skin: Negative for rash. Neurological:  Negative for headaches, focal weakness or numbness.   ____________________________________________   PHYSICAL EXAM:  VITAL SIGNS: ED Triage Vitals  Enc Vitals Group     BP 11/13/17 0925 138/67     Pulse Rate 11/13/17 0925 (!) 59     Resp 11/13/17 0925 15     Temp 11/13/17 0925 98.2 F (36.8 C)     Temp Source 11/13/17 0925 Oral     SpO2 11/13/17 0925 98 %     Weight 11/13/17 0926 218 lb (98.9 kg)     Height 11/13/17 0926 5\' 6"  (1.676 m)     Head Circumference --      Peak Flow --      Pain Score 11/13/17 0918 7     Pain Loc --      Pain Edu? --      Excl. in GC? --     Constitutional: Alert and oriented. Well appearing and in no acute distress. Eyes: Conjunctivae are normal.  Head: Atraumatic. Nose: No congestion/rhinnorhea. Mouth/Throat: Mucous membranes are moist.  Neck: No stridor.   Cardiovascular: Normal rate, regular rhythm. Grossly normal heart sounds.   Respiratory: Normal respiratory effort.  No retractions. Lungs CTAB. Gastrointestinal: Soft and nontender. No distention.  Digital rectal exam with brown stool but without fecal impaction.  I am able to palpate stool that is high up in the rectum but there is nothing low down to disimpact.  Musculoskeletal: No lower extremity tenderness nor edema.  No joint effusions. Neurologic:  Normal speech and language. No gross focal neurologic deficits are appreciated. Skin:  Skin is warm, dry and intact. No rash noted. Psychiatric: Mood and affect are normal. Speech and behavior are normal.  ____________________________________________   LABS (all labs ordered are listed, but only abnormal results are displayed)  Labs Reviewed - No data to display ____________________________________________  EKG   ____________________________________________  RADIOLOGY   ____________________________________________   PROCEDURES  Procedure(s) performed:   Procedures  Critical Care performed:    ____________________________________________   INITIAL IMPRESSION / ASSESSMENT AND PLAN / ED COURSE  Pertinent labs & imaging results that were available during my care of the patient were reviewed by me and considered in my medical decision making (see chart for details).  DDX: Constipation, fecal impaction, bowel obstruction, abdominal pain As part of my medical decision making, I reviewed the following data within the electronic MEDICAL RECORD NUMBER Notes from prior office visits  We will proceed with enema after finding no fecal impaction on rectal exam.   ----------------------------------------- 1:24 PM on 11/13/2017 -----------------------------------------  Patient had a large bowel movement and now feels relieved of his symptoms.  We discussed continuing his laxative as needed as well as increasing water intake as well as fiber intake.  We discussed ways to increase fiber intake such as  with a fiber cereal or fiber bars.  ____________________________________________   FINAL CLINICAL IMPRESSION(S) / ED DIAGNOSES  Constipation.    NEW MEDICATIONS STARTED DURING THIS VISIT:  This SmartLink is deprecated. Use AVSMEDLIST instead to display the medication list for a patient.   Note:  This document was prepared using Dragon voice recognition software and may include unintentional dictation errors.     Myrna BlazerSchaevitz, Allysha Tryon Matthew, MD 11/13/17 1324

## 2018-04-28 ENCOUNTER — Ambulatory Visit: Payer: Medicare Other | Attending: Specialist

## 2018-04-28 DIAGNOSIS — G4733 Obstructive sleep apnea (adult) (pediatric): Secondary | ICD-10-CM | POA: Diagnosis not present

## 2018-04-28 DIAGNOSIS — R0681 Apnea, not elsewhere classified: Secondary | ICD-10-CM | POA: Diagnosis present

## 2018-05-05 ENCOUNTER — Ambulatory Visit: Payer: Medicare Other | Attending: Internal Medicine

## 2018-05-05 DIAGNOSIS — G4733 Obstructive sleep apnea (adult) (pediatric): Secondary | ICD-10-CM | POA: Diagnosis not present

## 2018-05-30 DIAGNOSIS — G4733 Obstructive sleep apnea (adult) (pediatric): Secondary | ICD-10-CM | POA: Insufficient documentation

## 2018-07-12 ENCOUNTER — Encounter: Payer: Self-pay | Admitting: Pulmonary Disease

## 2018-07-12 ENCOUNTER — Ambulatory Visit (INDEPENDENT_AMBULATORY_CARE_PROVIDER_SITE_OTHER): Payer: Medicare Other | Admitting: Pulmonary Disease

## 2018-07-12 VITALS — BP 128/82 | HR 59 | Ht 66.0 in

## 2018-07-12 DIAGNOSIS — G4733 Obstructive sleep apnea (adult) (pediatric): Secondary | ICD-10-CM

## 2018-07-12 DIAGNOSIS — J310 Chronic rhinitis: Secondary | ICD-10-CM | POA: Diagnosis not present

## 2018-07-12 MED ORDER — FLUTICASONE PROPIONATE 50 MCG/ACT NA SUSP: 2.0000 | Freq: Every day | NASAL | 10 refills | Status: DC

## 2018-07-12 NOTE — Progress Notes (Signed)
PULMONARY/SLEEP CONSULT NOTE  Requesting MD/Service: Anderson Date of initial consultation: 07/12/18  Reason for consultation: OSA  PT PROFILE: 77 y.o. male remote smoker with recent diagnosis of OSA  DATA: 04/28/18 PSG: AHI 28/hr 05/09/18 Titration study: optimal CPAP 13 cm H2O   INTERVAL:  HPI:  As above. He has suffered with daytime hypersomnolence for decades. He wife notes that he is a heavy snorer and she has witnessed apneas while he sleeps. He has frequent morning HAs and is treated for hypertension. With this history a PSG was performed with finding of moderate OSA. CPAP has been initiated (approx 1 month ago) and he is wearing compliantly. However, he is struggling to fall asleep with the CPAP (setting of 13 cm H2O) whether he uses a full facemask or nasal pillows. He also reports intermittent nasal congestion. His wife reports that he still snores, even with the BiPAP. He has yet to discern much benefit from CPAP.   Past Medical History:  Diagnosis Date  . Arthritis   . Back pain   . Coronary artery disease   . Diabetes mellitus without complication (HCC)   . GERD (gastroesophageal reflux disease)   . History of hiatal hernia   . Hyperlipidemia   . Hypertension   . Sleep apnea     Past Surgical History:  Procedure Laterality Date  . APPENDECTOMY    . CARDIAC CATHETERIZATION    . CHOLECYSTECTOMY    . COLONOSCOPY WITH ESOPHAGOGASTRODUODENOSCOPY (EGD)    . COLONOSCOPY WITH PROPOFOL N/A 03/16/2016   Procedure: COLONOSCOPY WITH PROPOFOL;  Surgeon: [name], MD;  Location: [facility] ENDOSCOPY;  Service: Endoscopy;  Laterality: N/A;  . EYE SURGERY    . HERNIA REPAIR    . NISSEN FUNDOPLICATION    . testicular vein surgery    . TONSILLECTOMY    . TOTAL KNEE ARTHROPLASTY      MEDICATIONS: I have reviewed all medications and confirmed regimen as documented  Social History   Socioeconomic History  . Marital status: Married    Spouse name: Not on file  . Number  of children: Not on file  . Years of education: Not on file  . Highest education level: Not on file  Occupational History  . Not on file  Social Needs  . Financial resource strain: Not on file  . Food insecurity:    Worry: Not on file    Inability: Not on file  . Transportation needs:    Medical: Not on file    Non-medical: Not on file  Tobacco Use  . Smoking status: Former Games developer  . Smokeless tobacco: Never Used  Substance and Sexual Activity  . Alcohol use: Yes    Alcohol/week: 1.0 standard drinks    Types: 1 Cans of beer per week  . Drug use: No  . Sexual activity: Not on file  Lifestyle  . Physical activity:    Days per week: Not on file    Minutes per session: Not on file  . Stress: Not on file  Relationships  . Social connections:    Talks on phone: Not on file    Gets together: Not on file    Attends religious service: Not on file    Active member of club or organization: Not on file    Attends meetings of clubs or organizations: Not on file    Relationship status: Not on file  . Intimate partner violence:    Fear of current or ex partner: Not on file  Emotionally abused: Not on file    Physically abused: Not on file    Forced sexual activity: Not on file  Other Topics Concern  . Not on file  Social History Narrative  . Not on file    History reviewed. No pertinent family history.  ROS: No fever, myalgias/arthralgias, unexplained weight loss or weight gain No new focal weakness or sensory deficits No otalgia, hearing loss, visual changes, nasal and sinus symptoms, mouth and throat problems No neck pain or adenopathy No abdominal pain, N/V/D, diarrhea, change in bowel pattern No dysuria, change in urinary pattern   Vitals:   07/12/18 1342 07/12/18 1349  BP:  128/82  Pulse:  (!) 59  SpO2:  98%  Height: 5\' 6"  (1.676 m)   RA  EXAM:  Gen: WDWN, No overt respiratory distress HEENT: NCAT, sclera white, edentulous, turbinates edematous  bilaterally Neck: Supple without LAN, thyromegaly, JVD Lungs: breath sounds full, percussion normal, adventitious sounds: none Cardiovascular: RRR, no murmurs noted Abdomen: Soft, nontender, normal BS Ext: without clubbing, cyanosis, edema Neuro: CNs grossly intact, motor and sensory intact Skin: Limited exam, no lesions noted  DATA:   BMP Latest Ref Rng & Units 04/18/2013 06/20/2012 06/08/2012  Glucose 65 - 99 mg/dL 767(H) 419(F) 790(W)  BUN 7 - 18 mg/dL 8 13 14   Creatinine 0.60 - 1.30 mg/dL 4.09 7.35 3.29  Sodium 136 - 145 mmol/L 141 141 137  Potassium 3.5 - 5.1 mmol/L 3.7 3.8 5.0  Chloride 98 - 107 mmol/L 105 105 99  CO2 21 - 32 mmol/L 30 27 35(H)  Calcium 8.5 - 10.1 mg/dL 8.6 8.6 9.3    CBC Latest Ref Rng & Units 04/18/2013 06/20/2012 06/20/2012  WBC 3.8 - 10.6 x10 3/mm 3 4.2 7.0 6.7  Hemoglobin 13.0 - 18.0 g/dL 92.4 26.8 34.1  Hematocrit 40.0 - 52.0 % 38.5(L) 38.8(L) 40.2  Platelets 150 - 440 x10 3/mm 3 162 112(L) 137(L)    CXR:  No recent film  I have personally reviewed all chest radiographs reported above including CXRs and CT chest unless otherwise indicated  IMPRESSION:     ICD-10-CM   1. OSA (obstructive sleep apnea) G47.33 Desensitization mask fit    Ambulatory Referral for DME  2. Chronic rhinitis J31.0      PLAN:  Flonase nasal spray, 2 sprays per nostril at bedtime Use the full facemask (over nose and mouth) with CPAP for now Referral made to mask fit clinic in [place] We will change CPAP to auto set 5-20 cm H2O Follow-up in 4 to 6 weeks   Billy Fischer, MD PCCM service Mobile [phone] Pager [phone] 07/14/2018 4:59 PM

## 2018-07-12 NOTE — Patient Instructions (Signed)
Flonase nasal spray, 2 sprays per nostril at bedtime Use the full facemask (over nose and mouth) with CPAP Referral made to mask fit clinic in [place] We will change CPAP to auto set 5-20 cm H2O Follow-up in 4 to 6 weeks

## 2018-08-01 ENCOUNTER — Other Ambulatory Visit (HOSPITAL_BASED_OUTPATIENT_CLINIC_OR_DEPARTMENT_OTHER): Payer: PRIVATE HEALTH INSURANCE

## 2018-08-01 ENCOUNTER — Ambulatory Visit (HOSPITAL_BASED_OUTPATIENT_CLINIC_OR_DEPARTMENT_OTHER): Payer: Medicare Other | Attending: Pulmonary Disease | Admitting: Radiology

## 2018-08-01 DIAGNOSIS — G4733 Obstructive sleep apnea (adult) (pediatric): Secondary | ICD-10-CM

## 2018-08-12 ENCOUNTER — Ambulatory Visit (INDEPENDENT_AMBULATORY_CARE_PROVIDER_SITE_OTHER): Payer: Medicare Other | Admitting: Pulmonary Disease

## 2018-08-12 ENCOUNTER — Encounter: Payer: Self-pay | Admitting: Pulmonary Disease

## 2018-08-12 VITALS — BP 130/70 | HR 62 | Ht 68.5 in | Wt 183.0 lb

## 2018-08-12 DIAGNOSIS — G4733 Obstructive sleep apnea (adult) (pediatric): Secondary | ICD-10-CM

## 2018-08-12 NOTE — Patient Instructions (Addendum)
Continue to work with CPAP wearing it as much of the time with sleep as possible  We will contact the company to make a change in your CPAP machine to Autoset 5-15 cm H2O  Follow-up in 6 to 8 weeks.  Call sooner if needed

## 2018-08-15 ENCOUNTER — Encounter: Payer: Self-pay | Admitting: Pulmonary Disease

## 2018-08-15 NOTE — Progress Notes (Signed)
PULMONARY/SLEEP OFFICE FOLLOW-UP NOTE  Requesting MD/Service: Anderson Date of initial consultation: 07/12/18  Reason for consultation: OSA  PT PROFILE: 77 y.o. male remote smoker with recent diagnosis of OSA  DATA: 04/28/18 PSG: AHI 28/hr 05/09/18 Titration study: optimal CPAP 13 cm H2O   INTERVAL:  SUBJ: This is a scheduled office visit.  Since last visit, he has been to the mask fit clinic in [place] and is presently wearing a full facemask.  He still feels that he has having difficulty with proper mask fit and with mask leak.  He complains of dry mouth in the morning.  Previously, I will order to change him to the AutoSet settings from CPAP of 13.  This change was never made.  He has yet to reap the benefits of CPAP therapy.  He is somewhat frustrated but still willing to continue to work to get this right.  Vitals:   08/12/18 1017  BP: 130/70  Pulse: 62  SpO2: 96%  Weight: 183 lb (83 kg)  Height: 5' 8.5" (1.74 m)  RA  EXAM:  Gen: NAD HEENT: NCAT, sclera white Neck: No JVD Lungs: breath sounds full, no wheezes or other adventitious sounds Cardiovascular: RRR, no murmurs Abdomen: Soft, nontender, normal BS Ext: without clubbing, cyanosis, edema Neuro: grossly intact Skin: Limited exam, no lesions noted   DATA:   BMP Latest Ref Rng & Units 04/18/2013 06/20/2012 06/08/2012  Glucose 65 - 99 mg/dL 409(W) 119(J) 478(G)  BUN 7 - 18 mg/dL 8 13 14   Creatinine 0.60 - 1.30 mg/dL 9.56 2.13 0.86  Sodium 136 - 145 mmol/L 141 141 137  Potassium 3.5 - 5.1 mmol/L 3.7 3.8 5.0  Chloride 98 - 107 mmol/L 105 105 99  CO2 21 - 32 mmol/L 30 27 35(H)  Calcium 8.5 - 10.1 mg/dL 8.6 8.6 9.3    CBC Latest Ref Rng & Units 04/18/2013 06/20/2012 06/20/2012  WBC 3.8 - 10.6 x10 3/mm 3 4.2 7.0 6.7  Hemoglobin 13.0 - 18.0 g/dL 57.8 46.9 62.9  Hematocrit 40.0 - 52.0 % 38.5(L) 38.8(L) 40.2  Platelets 150 - 440 x10 3/mm 3 162 112(L) 137(L)    CXR:  No recent film  I have personally reviewed  all chest radiographs reported above including CXRs and CT chest unless otherwise indicated  IMPRESSION:     ICD-10-CM   1. OSA (obstructive sleep apnea) G47.33 Ambulatory Referral for DME   He has problems with compliance due to poor tolerance of mask and constant CPAP pressure.  PLAN:  I have encouraged him to continue to work with CPAP wearing it as much of the time with sleep as possible  We will contact his provider company to make a change in his CPAP to auto set 5-15 cm H2O  Follow-up in 6 to 8 weeks.  Call sooner if needed   Billy Fischer, MD PCCM service Mobile [phone] Pager [phone] 08/15/2018 2:50 PM

## 2018-08-16 ENCOUNTER — Ambulatory Visit: Payer: PRIVATE HEALTH INSURANCE | Admitting: Pulmonary Disease

## 2018-09-05 ENCOUNTER — Telehealth: Payer: Self-pay | Admitting: *Deleted

## 2018-09-05 DIAGNOSIS — G4733 Obstructive sleep apnea (adult) (pediatric): Secondary | ICD-10-CM

## 2018-09-05 NOTE — Telephone Encounter (Signed)
Back to mask fit clinic... Ask them to try to use a nasal mask if possible, with chin strap if necessary  Thanks  Theodoro Grist

## 2018-09-05 NOTE — Telephone Encounter (Signed)
Mask fit entered.

## 2018-09-05 NOTE — Telephone Encounter (Signed)
[employer] [facility] sent over request for updated visit note on Bobby Warner in regards to cpap therapy. Patient is still not compliant. His wife states that they were told he may need to go back to mask fit again. Please advise.

## 2018-09-30 ENCOUNTER — Encounter: Payer: Self-pay | Admitting: Pulmonary Disease

## 2018-09-30 ENCOUNTER — Ambulatory Visit (INDEPENDENT_AMBULATORY_CARE_PROVIDER_SITE_OTHER): Payer: Medicare Other | Admitting: Pulmonary Disease

## 2018-09-30 VITALS — BP 124/62 | HR 64 | Ht 68.5 in | Wt 182.0 lb

## 2018-09-30 DIAGNOSIS — G4733 Obstructive sleep apnea (adult) (pediatric): Secondary | ICD-10-CM

## 2018-09-30 NOTE — Patient Instructions (Signed)
Continue CPAP with sleep (AutoSet 5-15 cm H2O) Follow-up in 6 months Call us sooner for any problems or difficulties with CPAP

## 2018-10-02 NOTE — Progress Notes (Signed)
PULMONARY/SLEEP OFFICE FOLLOW-UP NOTE  Requesting MD/Service: Anderson Date of initial consultation: 07/12/18  Reason for consultation: OSA  PT PROFILE: 77 y.o. male remote smoker with recent diagnosis of OSA  DATA: 04/28/18 PSG: AHI 28/hr 05/09/18 Titration study: optimal CPAP 13 cm H2O  INTERVAL:  SUBJ: This is a scheduled follow up. Last visit, we changed CPAP to autoset CPAP 5-15 which he is tolerating better with improved sleep quality and minimal daytime hypersomnolence. He is still concerned about mask leak but notes that it is tolerable. He has no new complaints.   Vitals:   09/30/18 0822 09/30/18 0826  BP:  124/62  Pulse:  64  SpO2:  98%  Weight: 182 lb (82.6 kg)   Height: 5' 8.5" (1.74 m)   RA  EXAM:  Gen: NAD HEENT: NCAT, sclera white Neck: No JVD Lungs: breath sounds full, no wheezes or other adventitious sounds Cardiovascular: RRR, no murmurs Abdomen: Soft, nontender, normal BS Ext: without clubbing, cyanosis, edema Neuro: grossly intact Skin: Limited exam, no lesions noted    DATA:   BMP Latest Ref Rng & Units 04/18/2013 06/20/2012 06/08/2012  Glucose 65 - 99 mg/dL 409(W159(H) 119(J215(H) 478(G158(H)  BUN 7 - 18 mg/dL 8 13 14   Creatinine 0.60 - 1.30 mg/dL 9.561.08 2.131.13 0.861.14  Sodium 136 - 145 mmol/L 141 141 137  Potassium 3.5 - 5.1 mmol/L 3.7 3.8 5.0  Chloride 98 - 107 mmol/L 105 105 99  CO2 21 - 32 mmol/L 30 27 35(H)  Calcium 8.5 - 10.1 mg/dL 8.6 8.6 9.3    CBC Latest Ref Rng & Units 04/18/2013 06/20/2012 06/20/2012  WBC 3.8 - 10.6 x10 3/mm 3 4.2 7.0 6.7  Hemoglobin 13.0 - 18.0 g/dL 57.813.4 46.913.4 62.913.6  Hematocrit 40.0 - 52.0 % 38.5(L) 38.8(L) 40.2  Platelets 150 - 440 x10 3/mm 3 162 112(L) 137(L)    CXR:  No recent film  I have personally reviewed all chest radiographs reported above including CXRs and CT chest unless otherwise indicated  IMPRESSION:     ICD-10-CM   1. OSA (obstructive sleep apnea) G47.33      PLAN: Continue CPAP with sleep (AutoSet 5-15 cm  H2O) Follow-up in 6 months Call us sooner for any problems or difficulties with CPAP   Billy Fischeravid Simonds, MD PCCM service Mobile [phone] Pager [phone] 10/02/2018 3:06 PM

## 2018-12-27 DIAGNOSIS — M79601 Pain in right arm: Secondary | ICD-10-CM | POA: Insufficient documentation

## 2019-05-09 ENCOUNTER — Encounter: Payer: Self-pay | Admitting: Physical Therapy

## 2019-05-09 ENCOUNTER — Other Ambulatory Visit: Payer: Self-pay

## 2019-05-09 ENCOUNTER — Ambulatory Visit: Payer: Medicare Other | Attending: Orthopedic Surgery | Admitting: Physical Therapy

## 2019-05-09 DIAGNOSIS — R202 Paresthesia of skin: Secondary | ICD-10-CM | POA: Insufficient documentation

## 2019-05-09 DIAGNOSIS — M542 Cervicalgia: Secondary | ICD-10-CM

## 2019-05-09 DIAGNOSIS — M5412 Radiculopathy, cervical region: Secondary | ICD-10-CM | POA: Diagnosis present

## 2019-05-09 NOTE — Therapy (Signed)
[facility] [address] [place], [place], [address] Phone: [phone]   Fax:  [phone]  Physical Therapy Evaluation  Patient Details  Name: Bobby Warner. MRN: [id number] Date of Birth: November 17, 1940 Referring Provider (PT): Novella OlivePatel, Sunny Harish, MD   Encounter Date: 05/09/2019  PT End of Session - 05/09/19 1353    Visit Number  1    Number of Visits  12    Date for PT Re-Evaluation  06/20/19    Authorization Type  Medicare reporting period from 05/09/2019    Authorization Time Period  Current cert period: 05/09/2019 - 06/20/2019 (latest PN: IE 05/09/2019);    Authorization - Visit Number  1    Authorization - Number of Visits  10    PT Start Time  1100    PT Stop Time  1155    PT Time Calculation (min)  55 min    Activity Tolerance  Patient tolerated treatment well    Behavior During Therapy  WFL for tasks assessed/performed       Past Medical History:  Diagnosis Date   Arthritis    Back pain    Coronary artery disease    Diabetes mellitus without complication (HCC)    GERD (gastroesophageal reflux disease)    History of hiatal hernia    Hyperlipidemia    Hypertension    Sleep apnea     Past Surgical History:  Procedure Laterality Date   APPENDECTOMY     CARDIAC CATHETERIZATION     CHOLECYSTECTOMY     COLONOSCOPY WITH ESOPHAGOGASTRODUODENOSCOPY (EGD)     COLONOSCOPY WITH PROPOFOL N/A 03/16/2016   Procedure: COLONOSCOPY WITH PROPOFOL;  Surgeon: Christena DeemMartin U Skulskie, MD;  [facility];  Service: Endoscopy;  Laterality: N/A;   EYE SURGERY     HERNIA REPAIR     NISSEN FUNDOPLICATION     testicular vein surgery     TONSILLECTOMY     TOTAL KNEE ARTHROPLASTY      There were no vitals filed for this visit.   Subjective Assessment - 05/09/19 1310    Subjective  Patient reports his pain has been bothering him off and on for the last 3-4 years. He saw his PCP about it and  he thought it may be coming from his neck. States his pain and tingling is intermittent. It has seemed to come on more often over time. Sometimes it wants to hurt all the time. He gets headaches once in a while but doesn't seem connected to his neck/UE pain. He is R handed. 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 He is able to work through what he is doing. Has not had any prior treatment for his arm. It has bothered him more for the last year. Denies previous neck injuries or shoulder injuries. Denies spinal surgeries.    Pertinent History  Patient is a 78 y.o. male who presents to outpatient physical therapy with a referral for medical diagnosis of radiculopathy of cervical region. This patient's chief complaints consist of intermittent R UE pain and paresthesia, onset 3-4 years ago and gradually worsening leading to the following functional deficits: difficulty resting for prolonged periods in a chair or staying in air conditioning. He reports he can do most of his usual activities, but is bothered by the pain and paresthesia at rest and is concerned about it getting worse and limiting his function for using his R  UE, which is his dominant hand. Relevant past medical history and comorbidities include stent placement 6-7 years ago (followed by cardiologist recently, things are going well, has 50# lifting limit), diabetes (does not know AIC, cannot remember last check, was staying about the same), GERD, repaired hiatial hernia, HTN, sleep apnea, cholecystecomy, CAD, arthritis, appendectomy, hx of bilateral knee arthroplasty. Denies lung problems, epilepsy, or stroke.    Limitations  Other (comment);Sitting   difficulty resting for prolonged period in a chair, being in air conditioning, is bothered by pain that seems to be getting worse and he is concerned about is future UE funciton if his condition is not addressed now.   Diagnostic tests  See note from 05/09/2019 in  subjective exam for summary of cervical and R shoulder radiographs    Patient Stated Goals  get better and be able to get rid of this pain and tingling    Currently in Pain?  Yes    Pain Score  2    currently sitting; best 0/10, at worst 5/10   Pain Location  Arm   currently right forearm over the extensor bundle;  Total span of symptoms throughout time: Neck to R shoulder and running down arm to back of hand more than front of hand and not so much in thumb or 5th digit.   Pain Orientation  Right    Pain Descriptors / Indicators  Tingling   tingling in shoulder and forearm.   Pain Type  Chronic pain    Pain Radiating Towards  Total span of symptoms throughout time: Neck to R shoulder and running down arm to back of hand more than front of hand and not so much in thumb or 5th digit.    Pain Onset  More than a month ago    Pain Frequency  Intermittent    Aggravating Factors   bothers him more when he rests his arm on a high chair arm. Resting his arm on a chair arm if he sets there long enough, air conditioning,    Pain Relieving Factors  rubbing arm, letting his arm hang down, using arm.    Effect of Pain on Daily Activities  has trouble sitting for prolonged periods. being in air conditioning. Worried his R UE function will worsen if condition is allowed to get worse.        Imaging: radiograph report C-spine 03/30/2019: "There are significant  degenerative changes most prominently at at C4-5, 5-6 with osteophyte  formation and disc space narrowing. There are no notable fractures or  dislocations." R shoulder 03/09/2019: "There  are no fractures or dislocations. There are no significant degenerative  change of the glenohumeral joint. There are mild degenerative changes of the acromioclavicular joint."  [facility] PT Assessment - 05/09/19 0001      Assessment   Medical Diagnosis  radiculopathy of cervical region    Referring Provider (PT)  Novella Olive, MD    Onset Date/Surgical Date   05/09/15    Hand Dominance  Right    Prior Therapy  none for this problem      Precautions   Precautions  Fall;Other (comment)   notes he has 50# lifting limit due to heart stent   Precaution Comments  patient admits being a bit unsteady on feet      Restrictions   Weight Bearing Restrictions  No      Home Environment   Additional Comments  lives with wife at home      Prior  Function   Level of Independence  Independent    Vocation  Full time employment;Other (comment)   full time during school year, off over summer   Vocation Requirements   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     Leisure   used to love to bowl, he is pretty active, works up at the college in the summer time and fall. Bust up wood, use chain saw.       Cognition   Overall Cognitive Status  Within Functional Limits for tasks assessed   states he did not get an education and cannot read or write     Observation/Other Assessments   Observations  see note from 05/09/2019 for latest objective data      OBJECTIVE: OBSERVATION/INSPECTION: Patient presents with mildly slumped posture, no extreme variations in spinal alignment at the cervical or thoracic pine.   Baseline pain: R forearm 2/10 with paresthesia present there  NEUROLOGICAL: Dermatomes: BUE equal and intact to light touch.  Myotomes: BUE WNL Upper Motor Neuron Screen: Hoffman's, and Clonus (ankle) negative bilaterally.  SPINE MOTION Cervical Spine AROM:  *Indicates pain - Flexion: = 60 - Extension: = 50 tight (minimal lower cervical movement). - Rotation: R= 65, L = 60. (ipsilateral tightness at base of neck both sides) - Side Flexion: R= 30 tight at base of R neck, L = 30 tight at base of L neck  PERIPHERAL JOINT MOTION (AROM/PROM in degrees):  *Indicates pain BUE within functional limits and grossly equal, mild joint stiffness.   STRENGTH:  *Indicates pain - BUE grossly 4+/5 to 5/5  except R shoulder ER which was 4/5 but not painful.  - Grip fair + bilaterally.   REPEATED MOTIONS TESTING:  Seated with back supported using lumbar roll, Repeated cervical retraction 4x10 total: during = decreasing symptoms in R forearm; after = abolished pain/paresthesia in foream following 4 sets, was better each time.    SPECIAL TESTS:  Cervical Axial Compression: B = negative  Cervical Axial Distraction: B = negative  Spurling's: B = negative  ULNT: Standing active Radial, Ulnar, and Median bias grossly equal bilaterally (negative).   ACCESSORY MOTION:  - Hypomobile to CPA along mid to lower cervical spine and upper thoracic spine in sitting. Did not reproduce any symptoms.   PALPATION: - No very tender points to palpation at posterior cervical spine, R UT, shoulder, arm or forearm. Said palpation of upper trap and posterior cervical spine musculature felt good but no effect on arm symptoms.   FUNCTIONAL MOBILITY: - Bed mobility: not tested. - Transfers: sit <> stand independent. - Gait: independent for household and community mobility - Stairs: not tested  EDUCATION/COGNITION: Patient is alert and oriented X 4. Patient cannot read or write (wife can for him).   Objective measurements completed on examination: See above findings.       TREATMENT:  Therapeutic exercise: to centralize symptoms and improve ROM, strength, muscular endurance, and activity tolerance required for successful completion of functional activities.  - Seated with back supported using lumbar roll, Repeated cervical retraction 4x10 total: during = decreasing symptoms in R forearm; after = abolished pain/paresthesia in foream following 4 sets, was better each time.  - trial of seated cervical spine retraction with self overpressure x 10, patient able to reproduce proper form and force by end of set of 10. Discussed not pushing chin into fingers but allowing fingers to guide and press chin further back.   -  cervical retraction with clinician overpressure at the chin x 5 while teaching patient how to complete retraction with and without self-overpressure.   -Education on diagnosis, prognosis, POC, anatomy and physiology of current condition.  -Education on HEP including handout     HOME EXERCISE PROGRAM  Cervical Retraction in Sitting - MDT If you have no pain or tingling: - Complete 10 times every hour If you have pain or tingling - complete 10 at a time, checking with yourself after each 10 if your symptoms are better or worse. If they keep getting better, keep doing 10 at a time until the symptoms are gone or it is no longer getting better. If it stops getting better, can add a push with your fingers on your chin to get a better stretch. This works better with back supported in a chair or against the wall.    Patient response to treatment:  Pt tolerated treatment well. Pt was able to complete all exercises with minimal to no lasting increase in pain or discomfort. He had complete abolishment of symptoms in the R UE and neck with repeated cervical retraction with back supported this visit. Was instructed on appropriate use of this at home to maintain improvement and control any recurrences. Instructed in use of self-overpressure if symptoms do not fully respond to retraction without overpressure. Pt required multimodal cuing for proper technique and to facilitate improved neuromuscular control, strength, range of motion, and functional ability resulting in improved performance and form.     PT Education - 05/09/19 1353    Education Details  Exercise purpose/form. Self management techniques. Education on diagnosis, prognosis, POC, anatomy and physiology of current condition Education on HEP including handout    Person(s) Educated  Patient    Methods  Explanation;Demonstration;Tactile cues;Verbal cues;Handout    Comprehension  Verbalized understanding;Returned demonstration;Verbal cues  required;Tactile cues required        PT Short Term Goals - 05/09/19 1829      PT SHORT TERM GOAL #1   Title  Be independent with initial home exercise program for self-management of symptoms.    Baseline  Initial HEP provided at IE (05/09/2019);    Time  2    Period  Weeks    Target Date  05/23/19        PT Long Term Goals - 05/09/19 1802      PT LONG TERM GOAL #1   Title  Be independent with a long-term home exercise program for self-management of symptoms.    Baseline  Initial HEP provided at initial eval (05/09/2019);    Time  6    Period  Weeks    Status  New    Target Date  06/20/19      PT LONG TERM GOAL #2   Title  Patient will be able to abolish symptoms at onset 4/5 times they occur in order to demonstrate strong self-management skills and independent management of current condition.    Baseline  Patient able to abolish in clinic (05/09/2019):    Time  6    Period  Weeks    Status  New    Target Date  06/20/19      PT LONG TERM GOAL #3   Title  Have at least 75% cervical spine AROM in all planes with no compensations or increase in pain in all planes except intermittent end range discomfort to allow patient to complete valued activities with less difficulty.    Baseline  see objective exam, most  limited Extension: = 50 tight (minimal lower cervical movement; 630/2020);    Time  6    Period  Weeks    Status  New    Target Date  06/20/19      PT LONG TERM GOAL #4   Title  Complete community, work and/or recreational activities without limitation due to current condition    Baseline  Patient has difficulty sitting for long periods of time, being in air conditioned environment, and resting his R arm on an arm rest (05/09/2019).    Time  6    Period  Weeks    Status  New    Target Date  06/20/19      PT LONG TERM GOAL #5   Title  Reduce pain with functional activities to equal or less than 1/10 to allow patient to complete usual activities including ADLs, IADLs, and  social engagement with less difficulty.    Baseline  5/10 (05/09/2019);    Time  6    Period  Weeks    Status  New    Target Date  06/20/19             Plan - 05/09/19 1329    Clinical Impression Statement  Patient is a 78 y.o. male referred to outpatient physical therapy with a medical diagnosis of radiculopathy of cervical region who presents with signs and symptoms consistent with R UE arm and shoulder pain and paresthesia apparently referred from cervical spine. Patient presents with significant pain, paresthesia, joint stiffness, and ROM impairments that are limiting ability to complete his usual activities including prolonged sitting, air conditioned environment, work and leisure activities without difficulty. Patient will benefit from skilled physical therapy intervention to address current body structure impairments and activity limitations to improve function and work towards goals set in current POC in order to return to prior level of function or maximal functional improvement.    Personal Factors and Comorbidities  Age;Comorbidity 3+;Past/Current Experience;Education;Time since onset of injury/illness/exacerbation    Comorbidities  past medical history and comorbidities include stent placement 6-7 years ago (followed by cardiologist recently, things are going well, has 50# lifting limit), diabetes (does not know AIC, cannot remember last check, was staying about the same), GERD, repaired hiatial hernia, HTN, sleep apnea, cholecystecomy, CAD, arthritis, appendectomy, hx of bilateral knee arthroplasty. Denies lung problems, epilepsy, or stroke.    Examination-Activity Limitations  Sit;Other   Patient report pain with sitting activities and in air conditioned enviroment. He is able to complete all of his activities but is concerned about his R UE getting worse with time to affect all activities requiring R UE if it is not addressed   Examination-Participation Restrictions  --     Patient reports he can currently participate in his usual activities but is bothered by intermittant symptoms he is concerned about getting worse and further limiting him in the future   Stability/Clinical Decision Making  Stable/Uncomplicated    Clinical Decision Making  Low    Rehab Potential  Good    PT Frequency  2x / week    PT Duration  6 weeks    PT Treatment/Interventions  ADLs/Self Care Home Management;Cryotherapy;Moist Heat;Therapeutic activities;Therapeutic exercise;Neuromuscular re-education;Patient/family education;Manual techniques;Passive range of motion;Dry needling;Spinal Manipulations;Joint Manipulations;Other (comment)   joint mobilizations grades I-IV   PT Next Visit Plan  assess response to HEP and progress if appropriate    PT Home Exercise Plan  Cervical retraction x 10 per hour or if pain is present 10 at  a time until pain abolishes or fails to continue improving. (see note from 05/09/2019 for details)    Consulted and Agree with Plan of Care  Patient       Patient will benefit from skilled therapeutic intervention in order to improve the following deficits and impairments:  Decreased mobility, Increased muscle spasms, Impaired sensation, Cardiopulmonary status limiting activity, Decreased range of motion, Decreased activity tolerance, Pain, Decreased strength, Impaired flexibility  Visit Diagnosis: 1. Cervicalgia   2. Radiculopathy, cervical region   3. Paresthesia of skin        Problem List There are no active problems to display for this patient.   Luretha MurphySara R. Ilsa IhaSnyder, PT, DPT 05/09/19, 6:29 PM  [facility] [address] [place], [place], [address] Phone: [phone]   Fax:  [phone]  Name: Bobby GuestWilliam L Mccrumb Warner. MRN: [id number] Date of Birth: Jun 14, 1941

## 2019-05-11 ENCOUNTER — Encounter: Payer: Self-pay | Admitting: Physical Therapy

## 2019-05-11 ENCOUNTER — Other Ambulatory Visit: Payer: Self-pay

## 2019-05-11 ENCOUNTER — Ambulatory Visit: Payer: Medicare Other | Attending: Orthopedic Surgery

## 2019-05-11 DIAGNOSIS — M5412 Radiculopathy, cervical region: Secondary | ICD-10-CM | POA: Diagnosis present

## 2019-05-11 DIAGNOSIS — M542 Cervicalgia: Secondary | ICD-10-CM | POA: Insufficient documentation

## 2019-05-11 DIAGNOSIS — R202 Paresthesia of skin: Secondary | ICD-10-CM | POA: Diagnosis present

## 2019-05-11 NOTE — Therapy (Signed)
[facility] [address], [place], [address] Phone: [phone]   Fax:  [phone]  Physical Therapy Treatment  Patient Details  Name: Bobby WRAGE [name] MRN: [id number] Date of Birth: September 26, 1941 Referring Provider (PT): Langston Reusing, MD   Encounter Date: 05/11/2019  PT End of Session - 05/11/19 1152    Visit Number  2    Number of Visits  12    Date for PT Re-Evaluation  06/20/19    Authorization Type  Medicare reporting period from 05/09/2019    Authorization Time Period  Current cert period: 3/97/6734 - 06/20/2019 (latest PN: IE 05/09/2019);    Authorization - Visit Number  2    Authorization - Number of Visits  10    PT Start Time  1100    PT Stop Time  1142    PT Time Calculation (min)  42 min    Activity Tolerance  Patient tolerated treatment well    Behavior During Therapy  WFL for tasks assessed/performed       Past Medical History:  Diagnosis Date  . Arthritis   . Back pain   . Coronary artery disease   . Diabetes mellitus without complication ([place])   . GERD (gastroesophageal reflux disease)   . History of hiatal hernia   . Hyperlipidemia   . Hypertension   . Sleep apnea     Past Surgical History:  Procedure Laterality Date  . APPENDECTOMY    . CARDIAC CATHETERIZATION    . CHOLECYSTECTOMY    . COLONOSCOPY WITH ESOPHAGOGASTRODUODENOSCOPY (EGD)    . COLONOSCOPY WITH PROPOFOL N/A 03/16/2016   Procedure: COLONOSCOPY WITH PROPOFOL;  Surgeon: Lollie Sails, MD;  Location: [facility] ENDOSCOPY;  Service: Endoscopy;  Laterality: N/A;  . EYE SURGERY    . HERNIA REPAIR    . NISSEN FUNDOPLICATION    . testicular vein surgery    . TONSILLECTOMY    . TOTAL KNEE ARTHROPLASTY      There were no vitals filed for this visit.  Subjective Assessment - 05/11/19 1113    Subjective  Patient stated that since his evaluation his symptoms have been improved, Very complaint with HEP.    Pertinent  History  Patient is a 78 y.o. male who presents to outpatient physical therapy with a referral for medical diagnosis of radiculopathy of cervical region. This patient's chief complaints consist of intermittent R UE pain and paresthesia, onset 3-4 years ago and gradually worsening leading to the following functional deficits: difficulty resting for prolonged periods in a chair or staying in air conditioning. He reports he can do most of his usual activities, but is bothered by the pain and paresthesia at rest and is concerned about it getting worse and limiting his function for using his R UE, which is his dominant hand. Relevant past medical history and comorbidities include stent placement 6-7 years ago (followed by cardiologist recently, things are going well, has 50# lifting limit), diabetes (does not know AIC, cannot remember last check, was staying about the same), GERD, repaired hiatial hernia, HTN, sleep apnea, cholecystecomy, CAD, arthritis, appendectomy, hx of bilateral knee arthroplasty. Denies lung problems, epilepsy, or stroke.    Limitations  Other (comment);Sitting    Diagnostic tests  See note from 05/09/2019 in subjective exam for summary of cervical and R shoulder radiographs    Patient Stated Goals  get better and be able to get rid of this pain and tingling  Currently in Pain?  Yes    Pain Score  1     Pain Location  Arm   extensor bundle, reported that it travels down his R arm   Pain Orientation  Right    Pain Descriptors / Indicators  Tingling    Pain Type  Chronic pain    Pain Onset  More than a month ago    Pain Frequency  Intermittent       TREATMENT:  Therapeutic exercise: to centralize symptoms and improve ROM, strength, muscular endurance, and activity tolerance required for successful completion of functional activities.   - Seated with back supported using lumbar roll, Repeated cervical retraction 2x10 total: no changes in symptoms 2 sets of seated cervical spine  retraction with self overpressure x 10 no change in symptoms 2 sets of 10 of cervical retraction and extension; no changes in symptoms Finger overpressure with cervical extension 2x10; no changes in symptoms  Seated manual traction performed, 10x10 second holds 3x10sec holds with bias of opening of R side (head slightly laterally flexed to L) -pt reported abolishment of symptoms after traction  Standing scapular retraction with gentle chin tuck x10 Standing scap retraction and chin tuck pillow behind head for tactile cue to maintain chin tuck x10 Standing scap retraction and bilateral ER with pillow behind head to maintain x12     HOME EXERCISE PROGRAM  Cervical Retraction in Sitting - MDT   If you have no pain or tingling: - Complete 10 times every hour If you have pain or tingling - complete 10 at a time, checking with yourself after each 10 if your symptoms are better or worse. If they keep getting better, keep doing 10 at a time until the symptoms are gone or it is no longer getting better. If it stops getting better, can add a push with your fingers on your chin to get a better stretch. This works better with back supported in a chair or against the wall.   Patient response/clinical impression: Pt reported completely resolved symptoms this session with cervical traction by PT. Tolerated session well no complaints and able to complete exercises as directed.  PT provided visual/verbal/tactile cues to promote proper exercise technique/form as well as provided explanation of exercise purpose throughout session. The patient would benefit from further skilled PT to continue to progress towards goals.   PT Education - 05/11/19 1150    Education Details  exercise technique/form. Education on condition, purpose of traction    Person(s) Educated  Patient    Methods  Explanation;Demonstration;Verbal cues;Tactile cues    Comprehension  Verbalized understanding;Returned demonstration;Need  further instruction;Verbal cues required;Tactile cues required       PT Short Term Goals - 05/09/19 1829      PT SHORT TERM GOAL #1   Title  Be independent with initial home exercise program for self-management of symptoms.    Baseline  Initial HEP provided at IE (05/09/2019);    Time  2    Period  Weeks    Target Date  05/23/19        PT Long Term Goals - 05/09/19 1802      PT LONG TERM GOAL #1   Title  Be independent with a long-term home exercise program for self-management of symptoms.    Baseline  Initial HEP provided at initial eval (05/09/2019);    Time  6    Period  Weeks    Status  New    Target Date  06/20/19  PT LONG TERM GOAL #2   Title  Patient will be able to abolish symptoms at onset 4/5 times they occur in order to demonstrate strong self-management skills and independent management of current condition.    Baseline  Patient able to abolish in clinic (05/09/2019):    Time  6    Period  Weeks    Status  New    Target Date  06/20/19      PT LONG TERM GOAL #3   Title  Have at least 75% cervical spine AROM in all planes with no compensations or increase in pain in all planes except intermittent end range discomfort to allow patient to complete valued activities with less difficulty.    Baseline  see objective exam, most limited Extension: = 50 tight (minimal lower cervical movement; 630/2020);    Time  6    Period  Weeks    Status  New    Target Date  06/20/19      PT LONG TERM GOAL #4   Title  Complete community, work and/or recreational activities without limitation due to current condition    Baseline  Patient has difficulty sitting for long periods of time, being in air conditioned environment, and resting his R arm on an arm rest (05/09/2019).    Time  6    Period  Weeks    Status  New    Target Date  06/20/19      PT LONG TERM GOAL #5   Title  Reduce pain with functional activities to equal or less than 1/10 to allow patient to complete usual  activities including ADLs, IADLs, and social engagement with less difficulty.    Baseline  5/10 (05/09/2019);    Time  6    Period  Weeks    Status  New    Target Date  06/20/19            Plan - 05/11/19 1151    Clinical Impression Statement  Pt reported completely resolved symptoms this session with cervical traction by PT. Tolerated session well no complaints and able to complete exercises as directed.  PT provided visual/verbal/tactile cues to promote proper exercise technique/form as well as provided explanation of exercise purpose throughout session. The patient would benefit from further skilled PT to continue to progress towards goals.    Comorbidities  past medical history and comorbidities include stent placement 6-7 years ago (followed by cardiologist recently, things are going well, has 50# lifting limit), diabetes (does not know AIC, cannot remember last check, was staying about the same), GERD, repaired hiatial hernia, HTN, sleep apnea, cholecystecomy, CAD, arthritis, appendectomy, hx of bilateral knee arthroplasty. Denies lung problems, epilepsy, or stroke.    Rehab Potential  Good    PT Frequency  2x / week    PT Duration  6 weeks    PT Treatment/Interventions  ADLs/Self Care Home Management;Cryotherapy;Moist Heat;Therapeutic activities;Therapeutic exercise;Neuromuscular re-education;Patient/family education;Manual techniques;Passive range of motion;Dry needling;Spinal Manipulations;Joint Manipulations;Other (comment)    PT Next Visit Plan  assess response to HEP and progress if appropriate    PT Home Exercise Plan  Cervical retraction x 10 per hour or if pain is present 10 at a time until pain abolishes or fails to continue improving. (see note from 05/09/2019 for details)    Consulted and Agree with Plan of Care  Patient       Patient will benefit from skilled therapeutic intervention in order to improve the following deficits and impairments:  Decreased mobility,  Increased  muscle spasms, Impaired sensation, Cardiopulmonary status limiting activity, Decreased range of motion, Decreased activity tolerance, Pain, Decreased strength, Impaired flexibility  Visit Diagnosis: 1. Cervicalgia   2. Radiculopathy, cervical region   3. Paresthesia of skin        Problem List There are no active problems to display for this patient.   Olga Coasteriana Jazmaine Fuelling PT, DPT 12:00 PM,05/11/19 [phone]  [facility] [address] [place] , [place], [address] Phone: [phone]   Fax:  [phone]  Name: Bobby GuestWilliam L Lentsch [name] MRN: [id number] Date of Birth: 30-Aug-1941

## 2019-05-16 ENCOUNTER — Ambulatory Visit: Payer: Medicare Other

## 2019-05-16 ENCOUNTER — Other Ambulatory Visit: Payer: Self-pay

## 2019-05-16 ENCOUNTER — Encounter: Payer: Self-pay | Admitting: Physical Therapy

## 2019-05-16 DIAGNOSIS — M542 Cervicalgia: Secondary | ICD-10-CM

## 2019-05-16 DIAGNOSIS — M5412 Radiculopathy, cervical region: Secondary | ICD-10-CM

## 2019-05-16 DIAGNOSIS — R202 Paresthesia of skin: Secondary | ICD-10-CM

## 2019-05-16 NOTE — Therapy (Addendum)
[facility] [address] [place], [place], [address] Phone: [phone]   Fax:  [phone]  Physical Therapy Treatment  Patient Details  Name: Bobby GuestWilliam L Tamburo [name] MRN: [id number] Date of Birth: 1941-03-05 Referring Provider (PT): Novella OlivePatel, Sunny Harish, MD   Encounter Date: 05/16/2019  PT End of Session - 05/16/19 1122    Visit Number  3    Number of Visits  12    Date for PT Re-Evaluation  06/20/19    Authorization Type  Medicare reporting period from 05/09/2019    Authorization Time Period  Current cert period: 05/09/2019 - 06/20/2019 (latest PN: IE 05/09/2019);    Authorization - Visit Number  3    Authorization - Number of Visits  10    PT Start Time  1035    PT Stop Time  1117    PT Time Calculation (min)  42 min    Activity Tolerance  Patient tolerated treatment well    Behavior During Therapy  WFL for tasks assessed/performed       Past Medical History:  Diagnosis Date  . Arthritis   . Back pain   . Coronary artery disease   . Diabetes mellitus without complication ([facility])   . GERD (gastroesophageal reflux disease)   . History of hiatal hernia   . Hyperlipidemia   . Hypertension   . Sleep apnea     Past Surgical History:  Procedure Laterality Date  . APPENDECTOMY    . CARDIAC CATHETERIZATION    . CHOLECYSTECTOMY    . COLONOSCOPY WITH ESOPHAGOGASTRODUODENOSCOPY (EGD)    . COLONOSCOPY WITH PROPOFOL N/A 03/16/2016   Procedure: COLONOSCOPY WITH PROPOFOL;  Surgeon: Christena DeemMartin U Skulskie, MD;  [facility];  Service: Endoscopy;  Laterality: N/A;  . EYE SURGERY    . HERNIA REPAIR    . NISSEN FUNDOPLICATION    . testicular vein surgery    . TONSILLECTOMY    . TOTAL KNEE ARTHROPLASTY      There were no vitals filed for this visit.  Subjective Assessment - 05/16/19 1037    Subjective  Patient reported no pain or tingling or numbness at start of session. Stated he is tbeing very compliant with HEP.  Has trouble sitting with R elbow propped and shoulder IR (to place hands on stomach.) Reported increased tingling in this positiong. Pt instructed to avoid compression around elbow (weight bearing in that position) and to keep forearms in neutral to see how it responds. Also reported that at home his relief is lasting around 4hrs when he performs his HEP.    Pertinent History  Patient is a 78 y.o. male who presents to outpatient physical therapy with a referral for medical diagnosis of radiculopathy of cervical region. This patient's chief complaints consist of intermittent R UE pain and paresthesia, onset 3-4 years ago and gradually worsening leading to the following functional deficits: difficulty resting for prolonged periods in a chair or staying in air conditioning. He reports he can do most of his usual activities, but is bothered by the pain and paresthesia at rest and is concerned about it getting worse and limiting his function for using his R UE, which is his dominant hand. Relevant past medical history and comorbidities include stent placement 6-7 years ago (followed by cardiologist recently, things are going well, has 50# lifting limit), diabetes (does not know AIC, cannot remember last check, was staying about the same), GERD, repaired hiatial hernia, HTN, sleep apnea, cholecystecomy,  CAD, arthritis, appendectomy, hx of bilateral knee arthroplasty. Denies lung problems, epilepsy, or stroke.    Limitations  Other (comment);Sitting    Diagnostic tests  See note from 05/09/2019 in subjective exam for summary of cervical and R shoulder radiographs    Patient Stated Goals  get better and be able to get rid of this pain and tingling    Currently in Pain?  No/denies    Pain Onset  --       TREATMENT:  Therapeutic exercise: to centralize symptoms and improve ROM, strength, muscular endurance, and activity tolerance required for successful completion of functional activities.    -supine chin tucks  x20 with 5sec holds, cues to bring chin to chest versus head back into pillow -Supine chin tucks and lifts with verbal and tactile cues x10, 2sec hold - seated chin tucks x10 for form visual cues     Seated scapular retraction x10 for form x15 with RTB visual/verbal cues tends to lift shoulders Standing scap retraction and chin tuck pillow behind head for tactile cue to maintain chin tuck x15 Standing scap retraction and bilateral ER with pillow behind head to maintain x15 Doorway stretch unilaterally 2x30secs  Manual therapy: Supine manual traction performed, with x655mins of bias of opening of R side (head slightly laterally flexed to L) (PT utilized towel to aide traction, may benefit from trial of mechanical traction next session)      HOME EXERCISE PROGRAM   Cervical Retraction in Sitting - MDT    If you have no pain or tingling: - Complete 10 times every hour If you have pain or tingling - complete 10 at a time, checking with yourself after each 10 if your symptoms are better or worse. If they keep getting better, keep doing 10 at a time until the symptoms are gone or it is no longer getting better. If it stops getting better, can add a push with your fingers on your chin to get a better stretch. This works better with back supported in a chair or against the wall.     Patient response/clinical impression: Pt without symptoms throughout session. Improved ability to perform postural strengthening/achieve neutral positioning noted this session. Pt continued to need verbal/visual cues to maintain chin retraction form in seated, as well as new task of scapular retraction to avoid shoulder hiking. The patient would benefit from further skilled PT to continue to progress towards goals and continued education in preparation for self management of condition.       PT Education - 05/16/19 1119    Education Details  exercise form/technique, self management of condition, seated  position at home    Person(s) Educated  Patient    Methods  Explanation;Demonstration;Tactile cues;Verbal cues    Comprehension  Verbalized understanding;Returned demonstration;Tactile cues required;Verbal cues required;Need further instruction       PT Short Term Goals - 05/09/19 1829      PT SHORT TERM GOAL #1   Title  Be independent with initial home exercise program for self-management of symptoms.    Baseline  Initial HEP provided at IE (05/09/2019);    Time  2    Period  Weeks    Target Date  05/23/19        PT Long Term Goals - 05/09/19 1802      PT LONG TERM GOAL #1   Title  Be independent with a long-term home exercise program for self-management of symptoms.    Baseline  Initial HEP provided at initial  eval (05/09/2019);    Time  6    Period  Weeks    Status  New    Target Date  06/20/19      PT LONG TERM GOAL #2   Title  Patient will be able to abolish symptoms at onset 4/5 times they occur in order to demonstrate strong self-management skills and independent management of current condition.    Baseline  Patient able to abolish in clinic (05/09/2019):    Time  6    Period  Weeks    Status  New    Target Date  06/20/19      PT LONG TERM GOAL #3   Title  Have at least 75% cervical spine AROM in all planes with no compensations or increase in pain in all planes except intermittent end range discomfort to allow patient to complete valued activities with less difficulty.    Baseline  see objective exam, most limited Extension: = 50 tight (minimal lower cervical movement; 630/2020);    Time  6    Period  Weeks    Status  New    Target Date  06/20/19      PT LONG TERM GOAL #4   Title  Complete community, work and/or recreational activities without limitation due to current condition    Baseline  Patient has difficulty sitting for long periods of time, being in air conditioned environment, and resting his R arm on an arm rest (05/09/2019).    Time  6    Period  Weeks     Status  New    Target Date  06/20/19      PT LONG TERM GOAL #5   Title  Reduce pain with functional activities to equal or less than 1/10 to allow patient to complete usual activities including ADLs, IADLs, and social engagement with less difficulty.    Baseline  5/10 (05/09/2019);    Time  6    Period  Weeks    Status  New    Target Date  06/20/19            Plan - 05/16/19 1119    Clinical Impression Statement  Pt without symptoms throughout session. Improved ability to perform postural strengthening/achieve neutral positioning noted this session. Pt continued to need verbal/visual cues to maintain chin retraction form in seated, as well as new task of scapular retraction to avoid shoulder hiking. The patient would benefit from further skilled PT to continue to progress towards goals and continued education in preparation for self management of condition.    Comorbidities  past medical history and comorbidities include stent placement 6-7 years ago (followed by cardiologist recently, things are going well, has 50# lifting limit), diabetes (does not know AIC, cannot remember last check, was staying about the same), GERD, repaired hiatial hernia, HTN, sleep apnea, cholecystecomy, CAD, arthritis, appendectomy, hx of bilateral knee arthroplasty. Denies lung problems, epilepsy, or stroke.    Rehab Potential  Good    PT Frequency  2x / week    PT Duration  6 weeks    PT Treatment/Interventions  ADLs/Self Care Home Management;Cryotherapy;Moist Heat;Therapeutic activities;Therapeutic exercise;Neuromuscular re-education;Patient/family education;Manual techniques;Passive range of motion;Dry needling;Spinal Manipulations;Joint Manipulations;Other (comment)    PT Next Visit Plan  assess response to HEP and progress if appropriate    PT Home Exercise Plan  Cervical retraction x 10 per hour or if pain is present 10 at a time until pain abolishes or fails to continue improving. (see note from 05/09/2019  for details)    Consulted and Agree with Plan of Care  Patient       Patient will benefit from skilled therapeutic intervention in order to improve the following deficits and impairments:  Decreased mobility, Increased muscle spasms, Impaired sensation, Cardiopulmonary status limiting activity, Decreased range of motion, Decreased activity tolerance, Pain, Decreased strength, Impaired flexibility  Visit Diagnosis: 1. Cervicalgia   2. Radiculopathy, cervical region   3. Paresthesia of skin        Problem List There are no active problems to display for this patient.   Olga Coasteriana Shonnie Poudrier PT, DPT 11:23 AM,05/16/19 [phone]  [facility] [address] [place], [place], [address] Phone: [phone]   Fax:  [phone]  Name: Bobby GuestWilliam L Billard [name] MRN: [id number] Date of Birth: November 29, 1940

## 2019-05-18 ENCOUNTER — Other Ambulatory Visit: Payer: Self-pay

## 2019-05-18 ENCOUNTER — Encounter: Payer: Self-pay | Admitting: Physical Therapy

## 2019-05-18 ENCOUNTER — Ambulatory Visit: Payer: Medicare Other

## 2019-05-18 DIAGNOSIS — M5412 Radiculopathy, cervical region: Secondary | ICD-10-CM

## 2019-05-18 DIAGNOSIS — M542 Cervicalgia: Secondary | ICD-10-CM

## 2019-05-18 DIAGNOSIS — R202 Paresthesia of skin: Secondary | ICD-10-CM

## 2019-05-18 NOTE — Therapy (Signed)
[place] [facility] [address] [place], [place], [address] Phone: [phone]   Fax:  [phone]  Physical Therapy Treatment  Patient Details  Name: Bobby GuestWilliam L Egloff [name] MRN: [id number] Date of Birth: 05-Dec-1940 Referring Provider (PT): Novella OlivePatel, Sunny Harish, MD   Encounter Date: 05/18/2019  PT End of Session - 05/18/19 1102    Visit Number  4    Number of Visits  12    Date for PT Re-Evaluation  06/20/19    Authorization Type  Medicare reporting period from 05/09/2019    Authorization Time Period  Current cert period: 05/09/2019 - 06/20/2019 (latest PN: IE 05/09/2019);    Authorization - Visit Number  4    Authorization - Number of Visits  10    PT Start Time  1035    PT Stop Time  1119    PT Time Calculation (min)  44 min    Activity Tolerance  Patient tolerated treatment well    Behavior During Therapy  WFL for tasks assessed/performed       Past Medical History:  Diagnosis Date  . Arthritis   . Back pain   . Coronary artery disease   . Diabetes mellitus without complication (HCC)   . GERD (gastroesophageal reflux disease)   . History of hiatal hernia   . Hyperlipidemia   . Hypertension   . Sleep apnea     Past Surgical History:  Procedure Laterality Date  . APPENDECTOMY    . CARDIAC CATHETERIZATION    . CHOLECYSTECTOMY    . COLONOSCOPY WITH ESOPHAGOGASTRODUODENOSCOPY (EGD)    . COLONOSCOPY WITH PROPOFOL N/A 03/16/2016   Procedure: COLONOSCOPY WITH PROPOFOL;  Surgeon: Christena DeemMartin U Skulskie, MD;  [facility];  Service: Endoscopy;  Laterality: N/A;  . EYE SURGERY    . HERNIA REPAIR    . NISSEN FUNDOPLICATION    . testicular vein surgery    . TONSILLECTOMY    . TOTAL KNEE ARTHROPLASTY      There were no vitals filed for this visit.  Subjective Assessment - 05/18/19 1101    Subjective  Patient presented to clinic today with L sided neck pain that started about two days ago. Reported it feels  different compared to his R concordant pain, more of a tight muscle or cramp. He reported he was trying to massage it himself and is having pain with turning his head to the R. Pt without complaint of concordant R sided neck/arm pain or numbness and tingling this session. Stated the last time he had symptoms was last night. Pt reported he perfoemd 20 cervical retractions and his symptoms resolve.    Pertinent History  Patient is a 78 y.o. male who presents to outpatient physical therapy with a referral for medical diagnosis of radiculopathy of cervical region. This patient's chief complaints consist of intermittent R UE pain and paresthesia, onset 3-4 years ago and gradually worsening leading to the following functional deficits: difficulty resting for prolonged periods in a chair or staying in air conditioning. He reports he can do most of his usual activities, but is bothered by the pain and paresthesia at rest and is concerned about it getting worse and limiting his function for using his R UE, which is his dominant hand. Relevant past medical history and comorbidities include stent placement 6-7 years ago (followed by cardiologist recently, things are going well, has 50# lifting limit), diabetes (does not know AIC, cannot remember last check, was staying about the  same), GERD, repaired hiatial hernia, HTN, sleep apnea, cholecystecomy, CAD, arthritis, appendectomy, hx of bilateral knee arthroplasty. Denies lung problems, epilepsy, or stroke.    Limitations  Other (comment);Sitting    Diagnostic tests  See note from 05/09/2019 in subjective exam for summary of cervical and R shoulder radiographs    Patient Stated Goals  get better and be able to get rid of this pain and tingling    Currently in Pain?  Yes    Pain Score  4     Pain Location  Neck    Pain Orientation  Left    Pain Descriptors / Indicators  Tightness;Sore;Spasm;Cramping    Pain Type  Acute pain         TREATMENT:  Therapeutic  exercise: to centralize symptoms and improve ROM, strength, muscular endurance, and activity tolerance required for successful completion of functional activities. Extensive tactile cues throughout exercises to maintain proper form this session  - seated chin tucks 2x10 with 5 sec holds, visual cues -seated scapular retraction with tactile cues throughout for form to avoid UT involvement x10 -seated scapular retraction with facilitation of movement 2x10 with mirror for pt visual feedback Seated scapular retraction x10 for form x15 with RTB and mirror to avoid UT involvement/shoulder hike -UT stretch with PT stabilization of R shoulder 3x20sec holds -levator scapulae stretch with PT stabilization of R shoulder 3x30secs   (these stretches performed intermingled with manual therapy, cues to avoid painful ROM)  Manual therapy: STM/trigger point release to L UT, levator scap, posterior cervical paraspinals, suboccipital muscles. 10x10sec holds of manual cervical traction performed as well.      Patient response/clinical impression: Pt without symptoms of R sided radiculopathy/concordant pain this session. After manual techniques to L cervical musculature pt reported his L sided acute pain decreased from 4/10 to 2/10. Instructed in use of heating pad, stretches, and massage to address any remaining pain over the weekend. Remainder of session focused on promoting scapular strengthening/motor control to avoid overuse of bilateral UT muscles. Pt needed extensive tactile and visual cues with fair carry over. HEP updated this session as well. The patient would benefit from further skilled PT intervention for continued education, preparation for self management of condition and to address remaining limitations in functional abilities.   HOME EXERCISE PROGRAM; updated to include stretches and scapular retractions this session.   Cervical Retraction in Sitting - MDT   If you have no pain or tingling: -  Complete 10 times every hour If you have pain or tingling - complete 10 at a time, checking with yourself after each 10 if your symptoms are better or worse. If they keep getting better, keep doing 10 at a time until the symptoms are gone or it is no longer getting better. If it stops getting better, can add a push with your fingers on your chin to get a better stretch. This works better with back supported in a chair or against the wall.    Access Code: [email or web address]  URL: [email or web address]  Date: 05/18/2019  Prepared by: Olga Coasteriana Kylin Genna  Exercises Gentle Levator Scapulae Stretch - 3 reps - 1 sets - 1x daily - 7x weekly 30sec hold Seated Upper Trapezius Stretch - 3 reps - 1 sets - 1x daily - 7x weekly 30sec hold Seated Scapular Retraction - 10 reps - 1 sets - 2x daily - 7x weekly       PT Education - 05/18/19 1101    Education Details  condition, stretches, HEP  Person(s) Educated  Patient    Methods  Explanation;Demonstration;Verbal cues;Tactile cues;Handout    Comprehension  Verbalized understanding;Returned demonstration;Need further instruction       PT Short Term Goals - 05/09/19 1829      PT SHORT TERM GOAL #1   Title  Be independent with initial home exercise program for self-management of symptoms.    Baseline  Initial HEP provided at IE (05/09/2019);    Time  2    Period  Weeks    Target Date  05/23/19        PT Long Term Goals - 05/09/19 1802      PT LONG TERM GOAL #1   Title  Be independent with a long-term home exercise program for self-management of symptoms.    Baseline  Initial HEP provided at initial eval (05/09/2019);    Time  6    Period  Weeks    Status  New    Target Date  06/20/19      PT LONG TERM GOAL #2   Title  Patient will be able to abolish symptoms at onset 4/5 times they occur in order to demonstrate strong self-management skills and independent management of current condition.    Baseline  Patient able to abolish in  clinic (05/09/2019):    Time  6    Period  Weeks    Status  New    Target Date  06/20/19      PT LONG TERM GOAL #3   Title  Have at least 75% cervical spine AROM in all planes with no compensations or increase in pain in all planes except intermittent end range discomfort to allow patient to complete valued activities with less difficulty.    Baseline  see objective exam, most limited Extension: = 50 tight (minimal lower cervical movement; 630/2020);    Time  6    Period  Weeks    Status  New    Target Date  06/20/19      PT LONG TERM GOAL #4   Title  Complete community, work and/or recreational activities without limitation due to current condition    Baseline  Patient has difficulty sitting for long periods of time, being in air conditioned environment, and resting his R arm on an arm rest (05/09/2019).    Time  6    Period  Weeks    Status  New    Target Date  06/20/19      PT LONG TERM GOAL #5   Title  Reduce pain with functional activities to equal or less than 1/10 to allow patient to complete usual activities including ADLs, IADLs, and social engagement with less difficulty.    Baseline  5/10 (05/09/2019);    Time  6    Period  Weeks    Status  New    Target Date  06/20/19            Plan - 05/18/19 1139    Clinical Impression Statement  Pt without symptoms of R sided radiculopathy/concordant pain this session. After manual techniques to L cervical musculature pt reported his L sided acute pain decreased from 4/10 to 2/10. Instructed in use of heating pad, stretches, and massage to address any remaining pain over the weekend. Remainder of session focused on promoting scapular strengthening/motor control to avoid overuse of bilateral UT muscles. Pt needed extensive tactile and visual cues with fair carry over. HEP updated this session as well. The patient would benefit from further skilled PT  intervention for continued education, preparation for self management of condition  and to address remaining limitations in functional abilities.    Personal Factors and Comorbidities  Age;Comorbidity 3+;Past/Current Experience;Education;Time since onset of injury/illness/exacerbation    Comorbidities  past medical history and comorbidities include stent placement 6-7 years ago (followed by cardiologist recently, things are going well, has 50# lifting limit), diabetes (does not know AIC, cannot remember last check, was staying about the same), GERD, repaired hiatial hernia, HTN, sleep apnea, cholecystecomy, CAD, arthritis, appendectomy, hx of bilateral knee arthroplasty. Denies lung problems, epilepsy, or stroke.    Examination-Activity Limitations  Sit;Other    Stability/Clinical Decision Making  Stable/Uncomplicated    Rehab Potential  Good    PT Frequency  2x / week    PT Duration  6 weeks    PT Treatment/Interventions  ADLs/Self Care Home Management;Cryotherapy;Moist Heat;Therapeutic activities;Therapeutic exercise;Neuromuscular re-education;Patient/family education;Manual techniques;Passive range of motion;Dry needling;Spinal Manipulations;Joint Manipulations;Other (comment)    PT Next Visit Plan  assess response to HEP and progress if appropriate    PT Home Exercise Plan  Cervical retraction x 10 per hour or if pain is present 10 at a time until pain abolishes or fails to continue improving. (see note from 05/09/2019 for details)    Consulted and Agree with Plan of Care  Patient       Patient will benefit from skilled therapeutic intervention in order to improve the following deficits and impairments:  Decreased mobility, Increased muscle spasms, Impaired sensation, Cardiopulmonary status limiting activity, Decreased range of motion, Decreased activity tolerance, Pain, Decreased strength, Impaired flexibility  Visit Diagnosis: 1. Radiculopathy, cervical region   2. Cervicalgia   3. Paresthesia of skin        Problem List There are no active problems to display for  this patient.   Olga Coasteriana Aarron Wierzbicki PT, DPT 11:43 AM,05/18/19 [phone]  [facility] [facility] [address] [place], [place], [address] Phone: [phone]   Fax:  [phone]  Name: Bobby GuestWilliam L Netter [name] MRN: [id number] Date of Birth: [birth year]

## 2019-05-23 ENCOUNTER — Encounter: Payer: Self-pay | Admitting: Physical Therapy

## 2019-05-23 ENCOUNTER — Other Ambulatory Visit: Payer: Self-pay

## 2019-05-23 ENCOUNTER — Ambulatory Visit: Payer: Medicare Other | Admitting: Physical Therapy

## 2019-05-23 DIAGNOSIS — M542 Cervicalgia: Secondary | ICD-10-CM

## 2019-05-23 DIAGNOSIS — M5412 Radiculopathy, cervical region: Secondary | ICD-10-CM

## 2019-05-23 DIAGNOSIS — R202 Paresthesia of skin: Secondary | ICD-10-CM

## 2019-05-23 NOTE — Therapy (Signed)
[place] PHYSICAL AND SPORTS MEDICINE [address], [place], [address] Phone: [phone]   Fax:  [phone]  Physical Therapy Treatment  Patient Details  Name: RAD GRAMLING [name] MRN: [id number] Date of Birth: 20-Jan-1941 Referring Provider (PT): Langston Reusing, MD   Encounter Date: 05/23/2019  PT End of Session - 05/23/19 1252    Visit Number  5    Number of Visits  12    Date for PT Re-Evaluation  06/20/19    Authorization Type  Medicare reporting period from 05/09/2019    Authorization Time Period  Current cert period: 05/08/1600 - 06/20/2019 (latest PN: IE 05/09/2019);    Authorization - Visit Number  5    Authorization - Number of Visits  10    PT Start Time  1120    PT Stop Time  1200    PT Time Calculation (min)  40 min    Activity Tolerance  Patient tolerated treatment well    Behavior During Therapy  WFL for tasks assessed/performed       Past Medical History:  Diagnosis Date   Arthritis    Back pain    Coronary artery disease    Diabetes mellitus without complication (HCC)    GERD (gastroesophageal reflux disease)    History of hiatal hernia    Hyperlipidemia    Hypertension    Sleep apnea     Past Surgical History:  Procedure Laterality Date   APPENDECTOMY     CARDIAC CATHETERIZATION     CHOLECYSTECTOMY     COLONOSCOPY WITH ESOPHAGOGASTRODUODENOSCOPY (EGD)     COLONOSCOPY WITH PROPOFOL N/A 03/16/2016   Procedure: COLONOSCOPY WITH PROPOFOL;  Surgeon: Lollie Sails, MD;  Location: [facility] ENDOSCOPY;  Service: Endoscopy;  Laterality: N/A;   EYE SURGERY     HERNIA REPAIR     NISSEN FUNDOPLICATION     testicular vein surgery     TONSILLECTOMY     TOTAL KNEE ARTHROPLASTY      There were no vitals filed for this visit.  Subjective Assessment - 05/23/19 1120    Subjective  Patient reports that he has had only one instance of R arm symptoms since last session and he was able to abolish  it with his specific exercise. His main compliant today is his left upper trap region that is 6/10 upon arrival. He reports that last night it "felt like bees stinging" over the L UT. He feels like the exercises given to address this pain has not done anything but he has been performing them. Pateint states he could not lay on his left side the last 2 nights due to pain and it is waking him up.    Pertinent History  Patient is a 78 y.o. male who presents to outpatient physical therapy with a referral for medical diagnosis of radiculopathy of cervical region. This patient's chief complaints consist of intermittent R UE pain and paresthesia, onset 3-4 years ago and gradually worsening leading to the following functional deficits: difficulty resting for prolonged periods in a chair or staying in air conditioning. He reports he can do most of his usual activities, but is bothered by the pain and paresthesia at rest and is concerned about it getting worse and limiting his function for using his R UE, which is his dominant hand. Relevant past medical history and comorbidities include stent placement 6-7 years ago (followed by cardiologist recently, things are going well, has 50# lifting limit), diabetes (does not  know AIC, cannot remember last check, was staying about the same), GERD, repaired hiatial hernia, HTN, sleep apnea, cholecystecomy, CAD, arthritis, appendectomy, hx of bilateral knee arthroplasty. Denies lung problems, epilepsy, or stroke.    Limitations  Other (comment);Sitting    Diagnostic tests  See note from 05/09/2019 in subjective exam for summary of cervical and R shoulder radiographs    Patient Stated Goals  get better and be able to get rid of this pain and tingling    Currently in Pain?  Yes    Pain Score  6     Pain Location  Neck    Pain Orientation  Left   L upper trap   Pain Descriptors / Indicators  Aching;Tightness    Pain Type  Acute pain       TREATMENT: Therapeutic  exercise:to centralize symptoms and improve ROM, strength, muscular endurance, and activity tolerance required for successful completion of functional activities.Extensive tactile cues throughout exercises to maintain proper form this session - reviewed HEP, patient demo LS stretch and rotation AROM.  - L rotation with self overpressure 2x10, no effect - L sidebending x 10, repeated with self overpressure x 10, no effect (feels a bit looser).  - L spurling's test = localized pain/discomfort - seated cervical axial distraction = negative (manual therapy - see below) - seated in chair cervical retraction with self OP to extension, x10 pain returned to 2/10 in L UT, x10 no further effect.  - updated HEP progressed to retraction with OP to extension and discontinued UT/LS stretches for now. Updated verbally, as technology malfunction prevented new printout.   Manual therapy: - supine repeated retraction with clinician overpressure and mild traction. 3x10. Reported better each time until plateau at 3/10.  - manual traction x 10 seconds x 5 decreased pain to 2/10.  - supine manual repeated retraction to extension with traction 2x6 abolished pain.  - manual traction x 10 seconds x 2, pain continues to be abolished.    HOME EXERCISE PROGRAM: verbally updated to include retraction to extension.   Cervical Retraction in Sitting - MDT  If you have no pain or tingling: - Complete 10 times every hour If you have pain or tingling - complete 10 at a time, checking with yourself after each 10 if your symptoms are better or worse. If they keep getting better, keep doing 10 at a time until the symptoms are gone or it is no longer getting better. If it stops getting better, can add a push with your fingers on your chin to get a better stretch. This works better with back supported in a chair or against the wall.   Access Code: [email or web address]        PT Education - 05/23/19 1252    Education Details   exercise purpose/form/technique. updated HEP    Person(s) Educated  Patient    Methods  Explanation;Demonstration;Tactile cues;Verbal cues    Comprehension  Verbalized understanding;Returned demonstration;Verbal cues required;Tactile cues required       PT Short Term Goals - 05/09/19 1829      PT SHORT TERM GOAL #1   Title  Be independent with initial home exercise program for self-management of symptoms.    Baseline  Initial HEP provided at IE (05/09/2019);    Time  2    Period  Weeks    Target Date  05/23/19        PT Long Term Goals - 05/09/19 1802      PT LONG TERM GOAL #  1   Title  Be independent with a long-term home exercise program for self-management of symptoms.    Baseline  Initial HEP provided at initial eval (05/09/2019);    Time  6    Period  Weeks    Status  New    Target Date  06/20/19      PT LONG TERM GOAL #2   Title  Patient will be able to abolish symptoms at onset 4/5 times they occur in order to demonstrate strong self-management skills and independent management of current condition.    Baseline  Patient able to abolish in clinic (05/09/2019):    Time  6    Period  Weeks    Status  New    Target Date  06/20/19      PT LONG TERM GOAL #3   Title  Have at least 75% cervical spine AROM in all planes with no compensations or increase in pain in all planes except intermittent end range discomfort to allow patient to complete valued activities with less difficulty.    Baseline  see objective exam, most limited Extension: = 50 tight (minimal lower cervical movement; 630/2020);    Time  6    Period  Weeks    Status  New    Target Date  06/20/19      PT LONG TERM GOAL #4   Title  Complete community, work and/or recreational activities without limitation due to current condition    Baseline  Patient has difficulty sitting for long periods of time, being in air conditioned environment, and resting his R arm on an arm rest (05/09/2019).    Time  6    Period   Weeks    Status  New    Target Date  06/20/19      PT LONG TERM GOAL #5   Title  Reduce pain with functional activities to equal or less than 1/10 to allow patient to complete usual activities including ADLs, IADLs, and social engagement with less difficulty.    Baseline  5/10 (05/09/2019);    Time  6    Period  Weeks    Status  New    Target Date  06/20/19            Plan - 05/23/19 1253    Clinical Impression Statement  Pt without symptoms of R sided radiculopathy/concordant pain this session. No response to repeated motions in sitting for possible left relevant lateral component so discontinued. Went to supine position due to retraction progressions tested previously. Retraction with clinician OP decreased L symptoms and were further abolished by progression to retraction/extension. Also seemed to decrease with traction in supine and it is unclear if traction during the other manual procedures is what was most meaningful. If symptomatic at next session, recommend starting with just traction in supine to see if symptoms abolish that way. Tested then progressed seated retraction to extension for independent management at home. Pain returned to 2/10 in left UT by end of session. Patient tolerated treatment well and is making appropriate progress towards goals at this point. Patient would benefit from continued physical therapy to address remaining impairments and functional limitations to work towards stated goals and return to PLOF or maximal functional independence.    Personal Factors and Comorbidities  Age;Comorbidity 3+;Past/Current Experience;Education;Time since onset of injury/illness/exacerbation    Comorbidities  past medical history and comorbidities include stent placement 6-7 years ago (followed by cardiologist recently, things are going well, has 50# lifting limit),  diabetes (does not know AIC, cannot remember last check, was staying about the same), GERD, repaired hiatial hernia,  HTN, sleep apnea, cholecystecomy, CAD, arthritis, appendectomy, hx of bilateral knee arthroplasty. Denies lung problems, epilepsy, or stroke.    Examination-Activity Limitations  Sit;Other    Stability/Clinical Decision Making  Stable/Uncomplicated    Rehab Potential  Good    PT Frequency  2x / week    PT Duration  6 weeks    PT Treatment/Interventions  ADLs/Self Care Home Management;Cryotherapy;Moist Heat;Therapeutic activities;Therapeutic exercise;Neuromuscular re-education;Patient/family education;Manual techniques;Passive range of motion;Dry needling;Spinal Manipulations;Joint Manipulations;Other (comment)    PT Next Visit Plan  attempt traction first if symptomatic    PT Home Exercise Plan  Cervical retraction to extension x 10 per hour or if pain is present 10 at a time until pain abolishes or fails to continue improving. (see note from 05/09/2019 for details)    Consulted and Agree with Plan of Care  Patient       Patient will benefit from skilled therapeutic intervention in order to improve the following deficits and impairments:  Decreased mobility, Increased muscle spasms, Impaired sensation, Cardiopulmonary status limiting activity, Decreased range of motion, Decreased activity tolerance, Pain, Decreased strength, Impaired flexibility  Visit Diagnosis: 1. Radiculopathy, cervical region   2. Cervicalgia   3. Paresthesia of skin        Problem List There are no active problems to display for this patient.   Luretha MurphySara R. Ilsa IhaSnyder, PT, DPT 05/23/19, 12:55 PM  [place] [facility] [address] [place], [place], [address] Phone: [phone]   Fax:  [phone]  Name: Lind GuestWilliam L Sangalang [name] MRN: [id number] Date of Birth: [birth year]

## 2019-05-25 ENCOUNTER — Other Ambulatory Visit: Payer: Self-pay

## 2019-05-25 ENCOUNTER — Ambulatory Visit: Payer: Medicare Other | Admitting: Physical Therapy

## 2019-05-25 DIAGNOSIS — R202 Paresthesia of skin: Secondary | ICD-10-CM

## 2019-05-25 DIAGNOSIS — M542 Cervicalgia: Secondary | ICD-10-CM | POA: Diagnosis not present

## 2019-05-25 DIAGNOSIS — M5412 Radiculopathy, cervical region: Secondary | ICD-10-CM

## 2019-05-25 NOTE — Therapy (Signed)
[facility] [address] [place], [place], [id number] Phone: [phone]   Fax:  [phone]  Physical Therapy Treatment  Patient Details  Name: Bobby GuestWilliam L Littlefield [name] MRN: [id number] Date of Birth: 08-08-1941 Referring Provider (PT): Novella OlivePatel, Sunny Harish, MD   Encounter Date: 05/25/2019  PT End of Session - 05/26/19 0845    Visit Number  6    Number of Visits  12    Date for PT Re-Evaluation  06/20/19    Authorization Type  Medicare reporting period from 05/09/2019    Authorization Time Period  Current cert period: 05/09/2019 - 06/20/2019 (latest PN: IE 05/09/2019);    Authorization - Visit Number  6    Authorization - Number of Visits  10    PT Start Time  1120    PT Stop Time  1200    PT Time Calculation (min)  40 min    Activity Tolerance  Patient tolerated treatment well    Behavior During Therapy  WFL for tasks assessed/performed       Past Medical History:  Diagnosis Date  . Arthritis   . Back pain   . Coronary artery disease   . Diabetes mellitus without complication (HCC)   . GERD (gastroesophageal reflux disease)   . History of hiatal hernia   . Hyperlipidemia   . Hypertension   . Sleep apnea     Past Surgical History:  Procedure Laterality Date  . APPENDECTOMY    . CARDIAC CATHETERIZATION    . CHOLECYSTECTOMY    . COLONOSCOPY WITH ESOPHAGOGASTRODUODENOSCOPY (EGD)    . COLONOSCOPY WITH PROPOFOL N/A 03/16/2016   Procedure: COLONOSCOPY WITH PROPOFOL;  Surgeon: Christena DeemMartin U Skulskie, MD;  [facility];  Service: Endoscopy;  Laterality: N/A;  . EYE SURGERY    . HERNIA REPAIR    . NISSEN FUNDOPLICATION    . testicular vein surgery    . TONSILLECTOMY    . TOTAL KNEE ARTHROPLASTY      There were no vitals filed for this visit.  Subjective Assessment - 05/25/19 1122    Subjective  Patient reports he has had no difficulty with his R arm since last treatment session. He states his left UT  region is better but still bothering him some about 3/10 today. He states he feels confident with his HEP and is continuing to complete it.    Pertinent History  Patient is a 78 y.o. male who presents to outpatient physical therapy with a referral for medical diagnosis of radiculopathy of cervical region. This patient's chief complaints consist of intermittent R UE pain and paresthesia, onset 3-4 years ago and gradually worsening leading to the following functional deficits: difficulty resting for prolonged periods in a chair or staying in air conditioning. He reports he can do most of his usual activities, but is bothered by the pain and paresthesia at rest and is concerned about it getting worse and limiting his function for using his R UE, which is his dominant hand. Relevant past medical history and comorbidities include stent placement 6-7 years ago (followed by cardiologist recently, things are going well, has 50# lifting limit), diabetes (does not know AIC, cannot remember last check, was staying about the same), GERD, repaired hiatial hernia, HTN, sleep apnea, cholecystecomy, CAD, arthritis, appendectomy, hx of bilateral knee arthroplasty. Denies lung problems, epilepsy, or stroke.    Limitations  Other (comment);Sitting    Diagnostic tests  See note from 05/09/2019 in subjective exam for  summary of cervical and R shoulder radiographs    Patient Stated Goals  get better and be able to get rid of this pain and tingling    Currently in Pain?  Yes    Pain Score  3     Pain Location  Neck    Pain Orientation  Left    Pain Descriptors / Indicators  Tightness;Aching   "bees" intermittantly   Pain Type  Acute pain          TREATMENT:  Manual therapy: - supine manual cervical spine traction x 30 seconds x 6, pain decreased to 2/10 and then plateaued.  - supine repeated retraction with clinician overpressure and mild traction. 2x10. Reported better each time until abolished.   Therapeutic  exercise:to centralize symptoms and improve ROM, strength, muscular endurance, and activity tolerance required for successful completion of functional activities.Extensive tactile cues throughout exercises to maintain proper form this session - supine with head of the table using towel for support: retraction to extension x 5. Able to complete but challenging. Fatigues quickly. No change in symptoms following.  - supine with head and shoulders supported on plinth, pillow and towel roll placed horizontally under upper thoracic spine as a counter force. Cervical retraction with self OP x 10. Good tolerance, fair technique. No change in symptoms. Added to HEP.  - updated HEP progressed to include supine retraction with self OP on the bed with a pillow and towel placed under the upper thoracic spine.  - Standing rows with scapular retraction for improved postural and shoulder girdle strengthening and mobility. Required instruction for technique and cuing to retract, posteriorly tilt, and depress scapulae. Attempted with blue theraband x 5, unable to relax upper traps so discontinued. Attempted again with red theraband x 20 with improved form. sets x reps. Weight. - Standing thoracic extension/BUE flexion and serratus anterior activation, lat stretch, with foam roller up wall, 5 second holds, cuing for technique. x 10 plus time for instruction, transitions, and appropriate rest.  -  Sidelying open book (thoracic rotation) to improve thoracic, shoulder girdle, and upper trunk mobility. Required instruction for technique and cuing to achieve end range as tolerated, hold time, and breathing technique. 5 second holds. x10 each side with 2 second hold/one breath.       HOME EXERCISE PROGRAM: verbally updated to include retraction to extension.   Cervical Retraction in Sitting - MDT  If you have no pain or tingling: - Complete 10 times every hour If you have pain or tingling - complete 10 at a time,  checking with yourself after each 10 if your symptoms are better or worse. If they keep getting better, keep doing 10 at a time until the symptoms are gone or it is no longer getting better. If it stops getting better, can add a push with your fingers on your chin to get a better stretch. This works better with back supported in a chair or against the wall.  Supine cervical retraction with self overpressure with pillow and towel roll placed behind upper thoracic spine for counter-force. X 10-20 two times daily. Provided hand-drawn picture.    PT Education - 05/26/19 0845    Education Details  exercise purpose/form/technique. updated HEP. Self management techniques    Person(s) Educated  Patient    Methods  Explanation;Demonstration;Tactile cues;Verbal cues;Handout    Comprehension  Verbalized understanding;Returned demonstration;Verbal cues required;Tactile cues required       PT Short Term Goals - 05/09/19 1829  PT SHORT TERM GOAL #1   Title  Be independent with initial home exercise program for self-management of symptoms.    Baseline  Initial HEP provided at IE (05/09/2019);    Time  2    Period  Weeks    Target Date  05/23/19        PT Long Term Goals - 05/09/19 1802      PT LONG TERM GOAL #1   Title  Be independent with a long-term home exercise program for self-management of symptoms.    Baseline  Initial HEP provided at initial eval (05/09/2019);    Time  6    Period  Weeks    Status  New    Target Date  06/20/19      PT LONG TERM GOAL #2   Title  Patient will be able to abolish symptoms at onset 4/5 times they occur in order to demonstrate strong self-management skills and independent management of current condition.    Baseline  Patient able to abolish in clinic (05/09/2019):    Time  6    Period  Weeks    Status  New    Target Date  06/20/19      PT LONG TERM GOAL #3   Title  Have at least 75% cervical spine AROM in all planes with no compensations or  increase in pain in all planes except intermittent end range discomfort to allow patient to complete valued activities with less difficulty.    Baseline  see objective exam, most limited Extension: = 50 tight (minimal lower cervical movement; 630/2020);    Time  6    Period  Weeks    Status  New    Target Date  06/20/19      PT LONG TERM GOAL #4   Title  Complete community, work and/or recreational activities without limitation due to current condition    Baseline  Patient has difficulty sitting for long periods of time, being in air conditioned environment, and resting his R arm on an arm rest (05/09/2019).    Time  6    Period  Weeks    Status  New    Target Date  06/20/19      PT LONG TERM GOAL #5   Title  Reduce pain with functional activities to equal or less than 1/10 to allow patient to complete usual activities including ADLs, IADLs, and social engagement with less difficulty.    Baseline  5/10 (05/09/2019);    Time  6    Period  Weeks    Status  New    Target Date  06/20/19            Plan - 05/26/19 [id number]    Clinical Impression Statement  Patient tolerated treatment well today and reported abolishment of symptoms with manual therapy. Completed pure manual traction first to test whether this was the cause of resolution or if retraction with OP was adding to success. Symptoms did not resolve completely with traction alone, but then readily resolved with supine retraction with clinician OP, suggesting retraction forces are also necessary. Patient reported improvement to the point that palpation did not provoke symptoms. Patient's HEP was updated to include unloaded cervical retraction to improve carry over and longer term improvement. Patient may also benefit from more education regarding posture at next session. Patient demo overactivity of BUE traps during rows that improved with cuing and decreased resistance. Patient would benefit from continued physical therapy to address  remaining  impairments and functional limitations to work towards stated goals and return to PLOF or maximal functional independence.    Personal Factors and Comorbidities  Age;Comorbidity 3+;Past/Current Experience;Education;Time since onset of injury/illness/exacerbation    Comorbidities  past medical history and comorbidities include stent placement 6-7 years ago (followed by cardiologist recently, things are going well, has 50# lifting limit), diabetes (does not know AIC, cannot remember last check, was staying about the same), GERD, repaired hiatial hernia, HTN, sleep apnea, cholecystecomy, CAD, arthritis, appendectomy, hx of bilateral knee arthroplasty. Denies lung problems, epilepsy, or stroke.    Examination-Activity Limitations  Sit;Other    Stability/Clinical Decision Making  Stable/Uncomplicated    Rehab Potential  Good    PT Frequency  2x / week    PT Duration  6 weeks    PT Treatment/Interventions  ADLs/Self Care Home Management;Cryotherapy;Moist Heat;Therapeutic activities;Therapeutic exercise;Neuromuscular re-education;Patient/family education;Manual techniques;Passive range of motion;Dry needling;Spinal Manipulations;Joint Manipulations;Other (comment)    PT Next Visit Plan  attempt traction first if symptomatic    PT Home Exercise Plan  Supine cervical retraction with OP 10-20 reps 2x daily. Seated Cervical retraction to extension x 10 per hour or if pain is present 10 at a time until pain abolishes or fails to continue improving. (see note from 05/09/2019 for details)    Consulted and Agree with Plan of Care  Patient       Patient will benefit from skilled therapeutic intervention in order to improve the following deficits and impairments:  Decreased mobility, Increased muscle spasms, Impaired sensation, Cardiopulmonary status limiting activity, Decreased range of motion, Decreased activity tolerance, Pain, Decreased strength, Impaired flexibility  Visit Diagnosis: 1. Radiculopathy,  cervical region   2. Cervicalgia   3. Paresthesia of skin        Problem List There are no active problems to display for this patient.   Everlean Alstrom. Graylon Good, PT, DPT 05/26/19, 9:05 AM   [facility] [facility] [address], [place], [address] Phone: [phone]   Fax:  [phone]  Name: Bobby BECVAR [name] MRN: [id number] Date of Birth: 05/05/1941

## 2019-05-26 ENCOUNTER — Encounter: Payer: Self-pay | Admitting: Physical Therapy

## 2019-05-30 ENCOUNTER — Ambulatory Visit: Payer: Medicare Other | Admitting: Physical Therapy

## 2019-05-30 ENCOUNTER — Encounter: Payer: Self-pay | Admitting: Physical Therapy

## 2019-05-30 ENCOUNTER — Other Ambulatory Visit: Payer: Self-pay

## 2019-05-30 DIAGNOSIS — M542 Cervicalgia: Secondary | ICD-10-CM

## 2019-05-30 DIAGNOSIS — R202 Paresthesia of skin: Secondary | ICD-10-CM

## 2019-05-30 DIAGNOSIS — M5412 Radiculopathy, cervical region: Secondary | ICD-10-CM

## 2019-05-30 NOTE — Therapy (Signed)
[place] [facility] [address] [place], [place], [address] Phone: [phone]   Fax:  [phone]  Physical Therapy Treatment  Patient Details  Name: Lind GuestWilliam L Kerkman [name] MRN: [id number] Date of Birth: 08-07-1941 Referring Provider (PT): Novella OlivePatel, Sunny Harish, MD   Encounter Date: 05/30/2019  PT End of Session - 05/30/19 1220    Visit Number  7    Number of Visits  12    Date for PT Re-Evaluation  06/20/19    Authorization Type  Medicare reporting period from 05/09/2019    Authorization Time Period  Current cert period: 05/09/2019 - 06/20/2019 (latest PN: IE 05/09/2019);    Authorization - Visit Number  7    Authorization - Number of Visits  10    PT Start Time  1120    PT Stop Time  1200    PT Time Calculation (min)  40 min    Activity Tolerance  Patient tolerated treatment well    Behavior During Therapy  WFL for tasks assessed/performed       Past Medical History:  Diagnosis Date  . Arthritis   . Back pain   . Coronary artery disease   . Diabetes mellitus without complication (HCC)   . GERD (gastroesophageal reflux disease)   . History of hiatal hernia   . Hyperlipidemia   . Hypertension   . Sleep apnea     Past Surgical History:  Procedure Laterality Date  . APPENDECTOMY    . CARDIAC CATHETERIZATION    . CHOLECYSTECTOMY    . COLONOSCOPY WITH ESOPHAGOGASTRODUODENOSCOPY (EGD)    . COLONOSCOPY WITH PROPOFOL N/A 03/16/2016   Procedure: COLONOSCOPY WITH PROPOFOL;  Surgeon: Christena DeemMartin U Skulskie, MD;  [facility];  Service: Endoscopy;  Laterality: N/A;  . EYE SURGERY    . HERNIA REPAIR    . NISSEN FUNDOPLICATION    . testicular vein surgery    . TONSILLECTOMY    . TOTAL KNEE ARTHROPLASTY      There were no vitals filed for this visit.  Subjective Assessment - 05/30/19 1125    Subjective  Patient reports he had company this weekend and didn't do any exercises at all. he states his L UT is bothering  him upon arrival at about 3/10 and R neck and arm bothered him in the truck while he was sititng there just before he came in for his appointment about 2/10.    Pertinent History  Patient is a 78 y.o. male who presents to outpatient physical therapy with a referral for medical diagnosis of radiculopathy of cervical region. This patient's chief complaints consist of intermittent R UE pain and paresthesia, onset 3-4 years ago and gradually worsening leading to the following functional deficits: difficulty resting for prolonged periods in a chair or staying in air conditioning. He reports he can do most of his usual activities, but is bothered by the pain and paresthesia at rest and is concerned about it getting worse and limiting his function for using his R UE, which is his dominant hand. Relevant past medical history and comorbidities include stent placement 6-7 years ago (followed by cardiologist recently, things are going well, has 50# lifting limit), diabetes (does not know AIC, cannot remember last check, was staying about the same), GERD, repaired hiatial hernia, HTN, sleep apnea, cholecystecomy, CAD, arthritis, appendectomy, hx of bilateral knee arthroplasty. Denies lung problems, epilepsy, or stroke.    Limitations  Other (comment);Sitting    Diagnostic tests  See  note from 05/09/2019 in subjective exam for summary of cervical and R shoulder radiographs    Patient Stated Goals  get better and be able to get rid of this pain and tingling    Currently in Pain?  Yes    Pain Score  3     Pain Location  Neck    Pain Orientation  Right;Left    Pain Radiating Towards  radiating intermittantly down R arm    Pain Onset  More than a month ago        TREATMENT:   Therapeutic exercise:to centralize symptoms and improve ROM, strength, muscular endurance, and activity tolerance required for successful completion of functional activities.Extensive tactile cues throughout exercises to maintain proper  form this session - Upper body ergometer with no added resistance encourage joint nutrition, warm tissue, induce analgesic effect of aerobic exercise, improve muscular strength and endurance,  and prepare for remainder of session. X 5 min during subjective exam. Moist heat applied over L UT for 2.5 min to promote relaxation.  - seated with lumbar roll in chair cervical retraction with self overpressure x 20.  Decreased pain/tingling in R arm.  - seated with lumbar roll in chair: cervical retraction to extension x 20 - seated with lumbar roll in chair; cervical retraction to extension with rotation x 10, x10 with self OP. Slight decrease L UT painbut plateaued.  - repeated cervical retraction with clinician OP x 10. L UT better.  - seated repeated upper thoracic spine extension over edge of chair with clinician OP x 10 (decreased L UT pain), 2x10 independently (decreased L UT pain). Hands behind head to support head. Cuing for proper form.  - Standing thoracic extension/BUE flexion and serratus anterior activation, lat stretch, with foam roller up wall, 5 second holds, cuing for technique. x 10 plus time for instruction, transitions, and appropriate rest.  - standing B shoulder extension against yellow band anchored overhead pressing down and back into handles. To improve lower trap and retraction strength. Cuing for posture and technique. X 10 - standing B pec stretch in doorway. 3x30 seconds after extra time to teach technique and achieve acceptable form. Patient is very tight across anterior shoulders and had difficulty achieving correct form. Improved with cuing.  - updated HEP with handout.    HOME EXERCISE PROGRAM:  Seated Posture with Lumbar Roll Place the lumbar roll as shown in the picture on the left. Slide your bottom to the back of the chair and sit up straight so the roll provides support in the natural curve of your lower back. Keep your shoulders back and head in a neutral position.  Maintain this position at all time when sitting. If you have no pain or tingling: - Complete 10 times every hour If you have pain or tingling - complete 10 at a time, checking with yourself after each 10 if your symptoms are better or worse. If they keep getting better, keep doing 10 at a time until the symptoms are gone or it is no longer getting better. If it stops getting better, can add a push with your fingers on your chin to get a better stretch. This works better with back supported in a chair or against the wall. Seated Thoracic Extension If you have no pain or tingling: - Complete 10 times every hour If you have pain or tingling - complete 10 at a time, checking with yourself after each 10 if your symptoms are better or worse. If they keep getting  better, keep doing 10 at a time until the symptoms are gone or it is no longer getting better    PT Education - 05/30/19 1219    Education Details  exercise purpose/form/technique. updated HEP. Self management techniques    Person(s) Educated  Patient    Methods  Explanation;Demonstration;Tactile cues;Verbal cues;Handout    Comprehension  Verbalized understanding;Returned demonstration;Verbal cues required;Tactile cues required       PT Short Term Goals - 05/09/19 1829      PT SHORT TERM GOAL #1   Title  Be independent with initial home exercise program for self-management of symptoms.    Baseline  Initial HEP provided at IE (05/09/2019);    Time  2    Period  Weeks    Target Date  05/23/19        PT Long Term Goals - 05/09/19 1802      PT LONG TERM GOAL #1   Title  Be independent with a long-term home exercise program for self-management of symptoms.    Baseline  Initial HEP provided at initial eval (05/09/2019);    Time  6    Period  Weeks    Status  New    Target Date  06/20/19      PT LONG TERM GOAL #2   Title  Patient will be able to abolish symptoms at onset 4/5 times they occur in order to  demonstrate strong self-management skills and independent management of current condition.    Baseline  Patient able to abolish in clinic (05/09/2019):    Time  6    Period  Weeks    Status  New    Target Date  06/20/19      PT LONG TERM GOAL #3   Title  Have at least 75% cervical spine AROM in all planes with no compensations or increase in pain in all planes except intermittent end range discomfort to allow patient to complete valued activities with less difficulty.    Baseline  see objective exam, most limited Extension: = 50 tight (minimal lower cervical movement; 630/2020);    Time  6    Period  Weeks    Status  New    Target Date  06/20/19      PT LONG TERM GOAL #4   Title  Complete community, work and/or recreational activities without limitation due to current condition    Baseline  Patient has difficulty sitting for long periods of time, being in air conditioned environment, and resting his R arm on an arm rest (05/09/2019).    Time  6    Period  Weeks    Status  New    Target Date  06/20/19      PT LONG TERM GOAL #5   Title  Reduce pain with functional activities to equal or less than 1/10 to allow patient to complete usual activities including ADLs, IADLs, and social engagement with less difficulty.    Baseline  5/10 (05/09/2019);    Time  6    Period  Weeks    Status  New    Target Date  06/20/19            Plan - 05/30/19 1226    Clinical Impression Statement  Patient tolerated treatment well and again was able to abolish all pain during session, this time with loaded positions but continued to require clinician overpressure to achieve abolishment of pain. He came in slightly worse than previously after not completing HEP since  last session. Added thoracic extension which also positively affected his symptoms. Patient is quite tight in the upper thoracic and shoulder girdle region that may limit his ability to maintain good posture and prevent recurrence of pain.  Patient would benefit from continued physical therapy to address remaining impairments and functional limitations to work towards stated goals and return to PLOF or maximal functional independence.    Personal Factors and Comorbidities  Age;Comorbidity 3+;Past/Current Experience;Education;Time since onset of injury/illness/exacerbation    Comorbidities  past medical history and comorbidities include stent placement 6-7 years ago (followed by cardiologist recently, things are going well, has 50# lifting limit), diabetes (does not know AIC, cannot remember last check, was staying about the same), GERD, repaired hiatial hernia, HTN, sleep apnea, cholecystecomy, CAD, arthritis, appendectomy, hx of bilateral knee arthroplasty. Denies lung problems, epilepsy, or stroke.    Examination-Activity Limitations  Sit;Other    Stability/Clinical Decision Making  Stable/Uncomplicated    Rehab Potential  Good    PT Frequency  2x / week    PT Duration  6 weeks    PT Treatment/Interventions  ADLs/Self Care Home Management;Cryotherapy;Moist Heat;Therapeutic activities;Therapeutic exercise;Neuromuscular re-education;Patient/family education;Manual techniques;Passive range of motion;Dry needling;Spinal Manipulations;Joint Manipulations;Other (comment)    PT Next Visit Plan  more exercises for upper thoracic mobility and postural strength/motor control    PT Home Exercise Plan  see note    Consulted and Agree with Plan of Care  Patient       Patient will benefit from skilled therapeutic intervention in order to improve the following deficits and impairments:  Decreased mobility, Increased muscle spasms, Impaired sensation, Cardiopulmonary status limiting activity, Decreased range of motion, Decreased activity tolerance, Pain, Decreased strength, Impaired flexibility  Visit Diagnosis: 1. Radiculopathy, cervical region   2. Cervicalgia   3. Paresthesia of skin        Problem List There are no active problems to  display for this patient.   Everlean Alstrom. Graylon Good, PT, DPT 05/30/19, 12:27 PM  [place] [facility] [address], [place], [address] Phone: [phone]   Fax:  [phone]  Name: ROOSEVELT EIMERS [name] MRN: [id number] Date of Birth: [birth year]

## 2019-06-01 ENCOUNTER — Other Ambulatory Visit: Payer: Self-pay

## 2019-06-01 ENCOUNTER — Ambulatory Visit: Payer: Medicare Other | Admitting: Physical Therapy

## 2019-06-01 ENCOUNTER — Encounter: Payer: Self-pay | Admitting: Physical Therapy

## 2019-06-01 DIAGNOSIS — M542 Cervicalgia: Secondary | ICD-10-CM | POA: Diagnosis not present

## 2019-06-01 DIAGNOSIS — M5412 Radiculopathy, cervical region: Secondary | ICD-10-CM

## 2019-06-01 DIAGNOSIS — R202 Paresthesia of skin: Secondary | ICD-10-CM

## 2019-06-01 NOTE — Therapy (Signed)
[facility] [address] [place], [place], [id number] Phone: [phone]   Fax:  [phone]  Physical Therapy Treatment  Patient Details  Name: Bobby GuestWilliam L Oglesby [name] MRN: [id number] Date of Birth: 02-19-1941 Referring Provider (PT): Novella OlivePatel, Sunny Harish, MD   Encounter Date: 06/01/2019  PT End of Session - 06/01/19 1222    Visit Number  8    Number of Visits  12    Date for PT Re-Evaluation  06/20/19    Authorization Type  Medicare reporting period from 05/09/2019    Authorization Time Period  Current cert period: 05/09/2019 - 06/20/2019 (latest PN: IE 05/09/2019);    Authorization - Visit Number  8    Authorization - Number of Visits  10    PT Start Time  1120    PT Stop Time  1200    PT Time Calculation (min)  40 min    Activity Tolerance  Patient tolerated treatment well    Behavior During Therapy  WFL for tasks assessed/performed       Past Medical History:  Diagnosis Date  . Arthritis   . Back pain   . Coronary artery disease   . Diabetes mellitus without complication (HCC)   . GERD (gastroesophageal reflux disease)   . History of hiatal hernia   . Hyperlipidemia   . Hypertension   . Sleep apnea     Past Surgical History:  Procedure Laterality Date  . APPENDECTOMY    . CARDIAC CATHETERIZATION    . CHOLECYSTECTOMY    . COLONOSCOPY WITH ESOPHAGOGASTRODUODENOSCOPY (EGD)    . COLONOSCOPY WITH PROPOFOL N/A 03/16/2016   Procedure: COLONOSCOPY WITH PROPOFOL;  Surgeon: Christena DeemMartin U Skulskie, MD;  [facility];  Service: Endoscopy;  Laterality: N/A;  . EYE SURGERY    . HERNIA REPAIR    . NISSEN FUNDOPLICATION    . testicular vein surgery    . TONSILLECTOMY    . TOTAL KNEE ARTHROPLASTY      There were no vitals filed for this visit.  Subjective Assessment - 06/01/19 1221    Subjective  Patient reports he his feeling well today. He has 1/10 pain in bilateral UTs, no arm symptoms. Felt good after  last session. Has been doing his HEP again.    Pertinent History  Patient is a 78 y.o. male who presents to outpatient physical therapy with a referral for medical diagnosis of radiculopathy of cervical region. This patient's chief complaints consist of intermittent R UE pain and paresthesia, onset 3-4 years ago and gradually worsening leading to the following functional deficits: difficulty resting for prolonged periods in a chair or staying in air conditioning. He reports he can do most of his usual activities, but is bothered by the pain and paresthesia at rest and is concerned about it getting worse and limiting his function for using his R UE, which is his dominant hand. Relevant past medical history and comorbidities include stent placement 6-7 years ago (followed by cardiologist recently, things are going well, has 50# lifting limit), diabetes (does not know AIC, cannot remember last check, was staying about the same), GERD, repaired hiatial hernia, HTN, sleep apnea, cholecystecomy, CAD, arthritis, appendectomy, hx of bilateral knee arthroplasty. Denies lung problems, epilepsy, or stroke.    Limitations  Other (comment);Sitting    Diagnostic tests  See note from 05/09/2019 in subjective exam for summary of cervical and R shoulder radiographs    Patient Stated Goals  get better and  be able to get rid of this pain and tingling    Currently in Pain?  Yes    Pain Score  1     Pain Location  Neck   B UTs   Pain Onset  More than a month ago        TREATMENT:   Therapeutic exercise:to centralize symptoms and improve ROM, strength, muscular endurance, and activity tolerance required for successful completion of functional activities.Extensive tactile cues throughout exercises to maintain proper form this session -Standing thoracic extension/BUE flexion and serratus anterior activation, lat stretch, with round rust-colored bolster up wall, 5 second holds, cuing for technique. x10plus time for  instruction, transitions, and appropriate rest.  - seated repeated upper thoracic spine extension over edge of chair with clinician OP x 10 (decreased L UT pain) Hands behind head to support head. Cuing for proper form. (manual therapy - see below) - prone elbow lifts with hands over head to strength lower traps. X 10. Able to lift slightly.  - attempted prone horizontal B shoulder abduction, but patient felt catch and sharp pain in left lateral shoulder when clinician assisted in moving arm from overhead to abducted position. Discontinued and sat up. (L UT pain resolved except to touch after sitting up).  - seated with lumbar roll in chair cervical retraction with self overpressure x 20.  Decreased pain/tingling in R arm.  - seated with lumbar roll in chair cervical retraction with self overpressure to extension x 10.  (pain in L shoulder decreased, L UT pain abolished). - standing B pec stretch in doorway. 3x30 seconds after extra time to teach technique and achieve acceptable form. Patient is very tight across anterior shoulders and had difficulty achieving correct form. Improved with cuing.  - standing B shoulder extension against yellow band anchored overhead pressing down and back into handles. To improve lower trap and retraction strength. Cuing for posture and technique. 3X 10 - standing Y lift-offs from wall alternating sides, 2x10 to strengthen B lower traps. Tactile cuing to reduce UT activation.  - standing cervical spine retraction against yellow theraband, then against ball at wall. Improved technique with stabilization against the wall with spacer between the wall and back.  Difficulty with form with yellow theraband, improved with ball. X 20 total.   Manual therapy: to reduce pain and tissue tension, improve range of motion, neuromodulation, in order to promote improved ability to complete functional activities. - prone with arms overhead CPA with wedge assist along mid thoracic spine  through cervical spine. x10 reps each segment (pt reports more discomfort L > R). L UPA at mid cervical spine (also tender here).   HOME EXERCISE PROGRAM:  Seated Posture with Lumbar Roll Place the lumbar roll as shown in the picture on the left. Slide your bottom to the back of the chair and sit up straight so the roll provides support in the natural curve of your lower back. Keep your shoulders back and head in a neutral position. Maintain this position at all time when sitting. If you have no pain or tingling: - Complete 10 times every hour If you have pain or tingling - complete 10 at a time, checking with yourself after each 10 if your symptoms are better or worse. If they keep getting better, keep doing 10 at a time until the symptoms are gone or it is no longer getting better. If it stops getting better, can add a push with your fingers on your chin to get a better  stretch. This works better with back supported in a chair or against the wall. Seated Thoracic Extension If you have no pain or tingling: - Complete 10 times every hour If you have pain or tingling - complete 10 at a time, checking with yourself after each 10 if your symptoms are better or worse. If they keep getting better, keep doing 10 at a time until the symptoms are gone or it is no longer getting better    PT Education - 06/01/19 1222    Education Details  exercise purpose/form/technique. updated HEP. Self management techniques    Person(s) Educated  Patient    Methods  Explanation;Demonstration;Tactile cues;Verbal cues    Comprehension  Verbalized understanding;Returned demonstration;Tactile cues required;Verbal cues required       PT Short Term Goals - 05/09/19 1829      PT SHORT TERM GOAL #1   Title  Be independent with initial home exercise program for self-management of symptoms.    Baseline  Initial HEP provided at IE (05/09/2019);    Time  2    Period  Weeks    Target Date  05/23/19         PT Long Term Goals - 05/09/19 1802      PT LONG TERM GOAL #1   Title  Be independent with a long-term home exercise program for self-management of symptoms.    Baseline  Initial HEP provided at initial eval (05/09/2019);    Time  6    Period  Weeks    Status  New    Target Date  06/20/19      PT LONG TERM GOAL #2   Title  Patient will be able to abolish symptoms at onset 4/5 times they occur in order to demonstrate strong self-management skills and independent management of current condition.    Baseline  Patient able to abolish in clinic (05/09/2019):    Time  6    Period  Weeks    Status  New    Target Date  06/20/19      PT LONG TERM GOAL #3   Title  Have at least 75% cervical spine AROM in all planes with no compensations or increase in pain in all planes except intermittent end range discomfort to allow patient to complete valued activities with less difficulty.    Baseline  see objective exam, most limited Extension: = 50 tight (minimal lower cervical movement; 630/2020);    Time  6    Period  Weeks    Status  New    Target Date  06/20/19      PT LONG TERM GOAL #4   Title  Complete community, work and/or recreational activities without limitation due to current condition    Baseline  Patient has difficulty sitting for long periods of time, being in air conditioned environment, and resting his R arm on an arm rest (05/09/2019).    Time  6    Period  Weeks    Status  New    Target Date  06/20/19      PT LONG TERM GOAL #5   Title  Reduce pain with functional activities to equal or less than 1/10 to allow patient to complete usual activities including ADLs, IADLs, and social engagement with less difficulty.    Baseline  5/10 (05/09/2019);    Time  6    Period  Weeks    Status  New    Target Date  06/20/19  Plan - 06/01/19 1225    Clinical Impression Statement  Patient tolerated treatment well and continues to make progress towards goals. He arrived  with less pain this session and his symptoms resolved during session. Focused more on improved posture and scapular mechanics to decrease overuse of B UT that may produce pain. Patient would benefit from continued physical therapy to address remaining impairments and functional limitations to work towards stated goals and return to PLOF or maximal functional independence.    Personal Factors and Comorbidities  Age;Comorbidity 3+;Past/Current Experience;Education;Time since onset of injury/illness/exacerbation    Comorbidities  past medical history and comorbidities include stent placement 6-7 years ago (followed by cardiologist recently, things are going well, has 50# lifting limit), diabetes (does not know AIC, cannot remember last check, was staying about the same), GERD, repaired hiatial hernia, HTN, sleep apnea, cholecystecomy, CAD, arthritis, appendectomy, hx of bilateral knee arthroplasty. Denies lung problems, epilepsy, or stroke.    Examination-Activity Limitations  Sit;Other    Stability/Clinical Decision Making  Stable/Uncomplicated    Rehab Potential  Good    PT Frequency  2x / week    PT Duration  6 weeks    PT Treatment/Interventions  ADLs/Self Care Home Management;Cryotherapy;Moist Heat;Therapeutic activities;Therapeutic exercise;Neuromuscular re-education;Patient/family education;Manual techniques;Passive range of motion;Dry needling;Spinal Manipulations;Joint Manipulations;Other (comment)    PT Next Visit Plan  more exercises for upper thoracic mobility and postural strength/motor control    PT Home Exercise Plan  see note    Consulted and Agree with Plan of Care  Patient       Patient will benefit from skilled therapeutic intervention in order to improve the following deficits and impairments:  Decreased mobility, Increased muscle spasms, Impaired sensation, Cardiopulmonary status limiting activity, Decreased range of motion, Decreased activity tolerance, Pain, Decreased strength,  Impaired flexibility  Visit Diagnosis: 1. Radiculopathy, cervical region   2. Cervicalgia   3. Paresthesia of skin        Problem List There are no active problems to display for this patient.   Everlean Alstrom. Graylon Good, PT, DPT 06/01/19, 12:27 PM  [place] [facility] [address] [place], [address] Phone: [phone]   Fax:  [phone]  Name: Bobby BEECK [name] MRN: [id number] Date of Birth: 1940-12-21

## 2019-06-06 ENCOUNTER — Other Ambulatory Visit: Payer: Self-pay

## 2019-06-06 ENCOUNTER — Encounter: Payer: Self-pay | Admitting: Physical Therapy

## 2019-06-06 ENCOUNTER — Ambulatory Visit: Payer: Medicare Other | Admitting: Physical Therapy

## 2019-06-06 DIAGNOSIS — M5412 Radiculopathy, cervical region: Secondary | ICD-10-CM

## 2019-06-06 DIAGNOSIS — M542 Cervicalgia: Secondary | ICD-10-CM

## 2019-06-06 DIAGNOSIS — R202 Paresthesia of skin: Secondary | ICD-10-CM

## 2019-06-06 NOTE — Therapy (Signed)
[facility] [address], [place], [address] Phone: [phone]   Fax:  [phone]  Physical Therapy Treatment  Patient Details  Name: Bobby Warner [name] MRN: [id number] Date of Birth: 1941/10/20 Referring Provider (PT): Bobby Reusing, MD   Encounter Date: 06/06/2019  PT End of Session - 06/06/19 1210    Visit Number  9    Number of Visits  12    Date for PT Re-Evaluation  06/20/19    Authorization Type  Medicare reporting period from 05/09/2019    Authorization Time Period  Current cert period: 01/20/9701 - 06/20/2019 (latest PN: IE 05/09/2019);    Authorization - Visit Number  9    Authorization - Number of Visits  10    PT Start Time  6378    PT Stop Time  1200    PT Time Calculation (min)  44 min    Activity Tolerance  Patient tolerated treatment well    Behavior During Therapy  WFL for tasks assessed/performed       Past Medical History:  Diagnosis Date  . Arthritis   . Back pain   . Coronary artery disease   . Diabetes mellitus without complication ([place])   . GERD (gastroesophageal reflux disease)   . History of hiatal hernia   . Hyperlipidemia   . Hypertension   . Sleep apnea     Past Surgical History:  Procedure Laterality Date  . APPENDECTOMY    . CARDIAC CATHETERIZATION    . CHOLECYSTECTOMY    . COLONOSCOPY WITH ESOPHAGOGASTRODUODENOSCOPY (EGD)    . COLONOSCOPY WITH PROPOFOL N/A 03/16/2016   Procedure: COLONOSCOPY WITH PROPOFOL;  Surgeon: Lollie Sails, MD;  Location: [facility] ENDOSCOPY;  Service: Endoscopy;  Laterality: N/A;  . EYE SURGERY    . HERNIA REPAIR    . NISSEN FUNDOPLICATION    . testicular vein surgery    . TONSILLECTOMY    . TOTAL KNEE ARTHROPLASTY      There were no vitals filed for this visit.  Subjective Assessment - 06/06/19 1120    Subjective  Patient reports no pain upon arrival and states he is feeling overall.  Reports the last time he had pain was  yesterday and a bit of tingling in the R arm while in the car prior to coming in for today's visit. He reports he felt "a whole lot better" following last treatment session and his HEP is going well.    Pertinent History  Patient is a 78 y.o. male who presents to outpatient physical therapy with a referral for medical diagnosis of radiculopathy of cervical region. This patient's chief complaints consist of intermittent R UE pain and paresthesia, onset 3-4 years ago and gradually worsening leading to the following functional deficits: difficulty resting for prolonged periods in a chair or staying in air conditioning. He reports he can do most of his usual activities, but is bothered by the pain and paresthesia at rest and is concerned about it getting worse and limiting his function for using his R UE, which is his dominant hand. Relevant past medical history and comorbidities include stent placement 6-7 years ago (followed by cardiologist recently, things are going well, has 50# lifting limit), diabetes (does not know AIC, cannot remember last check, was staying about the same), GERD, repaired hiatial hernia, HTN, sleep apnea, cholecystecomy, CAD, arthritis, appendectomy, hx of bilateral knee arthroplasty. Denies lung problems, epilepsy, or stroke.    Limitations  Other (  comment);Sitting    Diagnostic tests  See note from 05/09/2019 in subjective exam for summary of cervical and R shoulder radiographs    Patient Stated Goals  get better and be able to get rid of this pain and tingling    Currently in Pain?  No/denies    Pain Onset  More than a month ago         TREATMENT:   Therapeutic exercise:to centralize symptoms and improve ROM, strength, muscular endurance, and activity tolerance required for successful completion of functional activities.Extensive tactile cues throughout exercises to maintain proper form this session -Standing thoracic extension/BUE flexion and serratus anterior  activation, lat stretch, with round rust-colored bolster up wall, 5 second holds, cuing for technique. x10plus time for instruction, transitions, and appropriate rest. - seated repeated upper thoracic spine extension over edge of chair with clinician OP x 10 (decreased L UT pain) Hands behind head to support head. Cuing for proper form.  - seated  in chair cervical retraction with self overpressure to extension x 10. (no pain at start or after) - standing B pec stretch in doorway. 3x30 seconds after extra time to teach technique and achieve acceptable form. Patient is very tight across anterior shoulders and had difficulty achieving correct form. Improved with cuing. - standing B shoulder extension against red band anchored overhead pressing down and back into handles. To improve lower trap and retraction strength. Cuing for posture and technique. 3X 10 - standing cervical spine retraction against ball at wall x 15 with half foam roll behind body (attempted with full foam roll but unable to reach ball with head). X 5 with 1kg medicine ball behind thoracic spine (best distance). Improved technique with stabilization against the wall with spacer between the wall and back.  X 20 total.  (manual therapy - see below) - prone with head resting in blue cradle. Bilateral horizontal shoulder abduction with scapulular retraction focusing on retraction, depression, and posterior tilt. 2x10 with tactile cues to decrease UT activity and increase mid and low trap activation.  - prone elbow lifts with hands over head to strength lower traps. X 10. Able to lift slightly.   Manual therapy: to reduce pain and tissue tension, improve range of motion, neuromodulation, in order to promote improved ability to complete functional activities. - prone with arms overhead CPA  along thoracic spine through cervical spine. x10 reps each segment (pt reports more discomfort L > R). L UPA at mid cervical spine (also tender here).    HOME EXERCISE PROGRAM: Seated Posture with Lumbar Roll Place the lumbar roll as shown in the picture on the left. Slide your bottom to the back of the chair and sit up straight so the roll provides support in the natural curve of your lower back. Keep your shoulders back and head in a neutral position. Maintain this position at all time when sitting. If you have no pain or tingling: - Complete 10 times every hour If you have pain or tingling - complete 10 at a time, checking with yourself after each 10 if your symptoms are better or worse. If they keep getting better, keep doing 10 at a time until the symptoms are gone or it is no longer getting better. If it stops getting better, can add a push with your fingers on your chin to get a better stretch. This works better with back supported in a chair or against the wall. Seated Thoracic Extension If you have no pain or tingling: - Complete  10 times every hour If you have pain or tingling - complete 10 at a time, checking with yourself after each 10 if your symptoms are better or worse. If they keep getting better, keep doing 10 at a time until the symptoms are gone or it is no longer getting better    PT Education - 06/06/19 1213    Education Details  exercise purpose/form/technique. updated HEP. Self management techniques    Person(s) Educated  Patient    Methods  Explanation;Demonstration;Tactile cues;Verbal cues    Comprehension  Verbalized understanding;Returned demonstration;Verbal cues required;Tactile cues required       PT Short Term Goals - 05/09/19 1829      PT SHORT TERM GOAL #1   Title  Be independent with initial home exercise program for self-management of symptoms.    Baseline  Initial HEP provided at IE (05/09/2019);    Time  2    Period  Weeks    Target Date  05/23/19        PT Long Term Goals - 05/09/19 1802      PT LONG TERM GOAL #1   Title  Be independent with a long-term home exercise  program for self-management of symptoms.    Baseline  Initial HEP provided at initial eval (05/09/2019);    Time  6    Period  Weeks    Status  New    Target Date  06/20/19      PT LONG TERM GOAL #2   Title  Patient will be able to abolish symptoms at onset 4/5 times they occur in order to demonstrate strong self-management skills and independent management of current condition.    Baseline  Patient able to abolish in clinic (05/09/2019):    Time  6    Period  Weeks    Status  New    Target Date  06/20/19      PT LONG TERM GOAL #3   Title  Have at least 75% cervical spine AROM in all planes with no compensations or increase in pain in all planes except intermittent end range discomfort to allow patient to complete valued activities with less difficulty.    Baseline  see objective exam, most limited Extension: = 50 tight (minimal lower cervical movement; 630/2020);    Time  6    Period  Weeks    Status  New    Target Date  06/20/19      PT LONG TERM GOAL #4   Title  Complete community, work and/or recreational activities without limitation due to current condition    Baseline  Patient has difficulty sitting for long periods of time, being in air conditioned environment, and resting his R arm on an arm rest (05/09/2019).    Time  6    Period  Weeks    Status  New    Target Date  06/20/19      PT LONG TERM GOAL #5   Title  Reduce pain with functional activities to equal or less than 1/10 to allow patient to complete usual activities including ADLs, IADLs, and social engagement with less difficulty.    Baseline  5/10 (05/09/2019);    Time  6    Period  Weeks    Status  New    Target Date  06/20/19            Plan - 06/06/19 1209    Clinical Impression Statement  Patient tolerated treatment well and had no report of  pain or paresthesia throughout session. Patient continues to demo overestimation of bilateral upper traps, so treatment has included exercises designed to strengthen  and activate lower and mid traps for improved posture and endurance. Patient is now demonstrating between visit improvements. He has not yet shown long term stability in improve however. Patient would benefit from continued physical therapy to address remaining impairments and functional limitations to work towards stated goals and return to PLOF or maximal functional independence.    Personal Factors and Comorbidities  Age;Comorbidity 3+;Past/Current Experience;Education;Time since onset of injury/illness/exacerbation    Comorbidities  past medical history and comorbidities include stent placement 6-7 years ago (followed by cardiologist recently, things are going well, has 50# lifting limit), diabetes (does not know AIC, cannot remember last check, was staying about the same), GERD, repaired hiatial hernia, HTN, sleep apnea, cholecystecomy, CAD, arthritis, appendectomy, hx of bilateral knee arthroplasty. Denies lung problems, epilepsy, or stroke.    Examination-Activity Limitations  Sit;Other    Stability/Clinical Decision Making  Stable/Uncomplicated    Rehab Potential  Good    PT Frequency  2x / week    PT Duration  6 weeks    PT Treatment/Interventions  ADLs/Self Care Home Management;Cryotherapy;Moist Heat;Therapeutic activities;Therapeutic exercise;Neuromuscular re-education;Patient/family education;Manual techniques;Passive range of motion;Dry needling;Spinal Manipulations;Joint Manipulations;Other (comment)    PT Next Visit Plan  more exercises for upper thoracic mobility and postural strength/motor control    PT Home Exercise Plan  see note    Consulted and Agree with Plan of Care  Patient       Patient will benefit from skilled therapeutic intervention in order to improve the following deficits and impairments:  Decreased mobility, Increased muscle spasms, Impaired sensation, Cardiopulmonary status limiting activity, Decreased range of motion, Decreased activity tolerance, Pain, Decreased  strength, Impaired flexibility  Visit Diagnosis: 1. Radiculopathy, cervical region   2. Cervicalgia   3. Paresthesia of skin        Problem List There are no active problems to display for this patient.   Luretha MurphySara R. Ilsa IhaSnyder, PT, DPT 06/06/19, 12:16 PM  [facility] [address] [place], [place], [id number] Phone: [phone]   Fax:  [phone]  Name: Lind GuestWilliam L Quirion [name] MRN: [id number] Date of Birth: 06/18/1941

## 2019-06-08 ENCOUNTER — Ambulatory Visit: Payer: Medicare Other | Admitting: Physical Therapy

## 2019-06-08 ENCOUNTER — Encounter: Payer: Self-pay | Admitting: Physical Therapy

## 2019-06-08 ENCOUNTER — Other Ambulatory Visit: Payer: Self-pay

## 2019-06-08 VITALS — BP 103/53 | HR 62

## 2019-06-08 DIAGNOSIS — M542 Cervicalgia: Secondary | ICD-10-CM

## 2019-06-08 DIAGNOSIS — M5412 Radiculopathy, cervical region: Secondary | ICD-10-CM

## 2019-06-08 DIAGNOSIS — R202 Paresthesia of skin: Secondary | ICD-10-CM

## 2019-06-08 NOTE — Therapy (Signed)
[facility] [address], [place], [address] Phone: [phone]   Fax:  [phone]  Physical Therapy Treatment / Progress Note / Re-Certification Reporting period: 05/09/2019 - 06/08/2019   Patient Details  Name: Bobby Warner [name] MRN: [id number] Date of Birth: 02/19/1941 Referring Provider (PT): Langston Reusing, MD   Encounter Date: 06/08/2019  PT End of Session - 06/08/19 1337    Visit Number  10    Number of Visits  18    Date for PT Re-Evaluation  07/06/19    Authorization Type  Medicare reporting period from 05/09/2019 (new reporting period 06/08/2019)    Authorization Time Period  Current cert period: 9/37/1696 - 06/06/2019 (latest PN: 06/08/2019);    Authorization - Visit Number  10    Authorization - Number of Visits  10    PT Start Time  7893    PT Stop Time  1200    PT Time Calculation (min)  43 min    Activity Tolerance  Patient tolerated treatment well    Behavior During Therapy  WFL for tasks assessed/performed       Past Medical History:  Diagnosis Date  . Arthritis   . Back pain   . Coronary artery disease   . Diabetes mellitus without complication ([place])   . GERD (gastroesophageal reflux disease)   . History of hiatal hernia   . Hyperlipidemia   . Hypertension   . Sleep apnea     Past Surgical History:  Procedure Laterality Date  . APPENDECTOMY    . CARDIAC CATHETERIZATION    . CHOLECYSTECTOMY    . COLONOSCOPY WITH ESOPHAGOGASTRODUODENOSCOPY (EGD)    . COLONOSCOPY WITH PROPOFOL N/A 03/16/2016   Procedure: COLONOSCOPY WITH PROPOFOL;  Surgeon: Lollie Sails, MD;  Location: [facility] ENDOSCOPY;  Service: Endoscopy;  Laterality: N/A;  . EYE SURGERY    . HERNIA REPAIR    . NISSEN FUNDOPLICATION    . testicular vein surgery    . TONSILLECTOMY    . TOTAL KNEE ARTHROPLASTY      Vitals:   06/08/19 1119  BP: (!) 103/53  Pulse: 62  SpO2: 99%    Subjective Assessment - 06/08/19  1119    Subjective  Patient reports he is having one of those days where he just feels like he has no energy. He reports bilateral UT are painful today and this started about 7 pm on Tuesday (day of last treatment session). Reports no parestheisa in either UE. He reports the upper trap pain feels like the pain he has had in the past. He has continued to perform his HEP. Patient reports that overall he feels like his pain is getting better. He has not been paying attention to how long he can sit as much and he feels more confident his pain will continue to gte better. He feels his condition is about 30% better. He states his wife says he falls asleep in sitting position and ends up with his head hanging forward that may contribute to his pain. He doesn't even realize his head is down like this. He doesn't notice it being any more sore when this happens. Patient reports he was able to nearly abolish symptoms with his exercises when he remembers to do them when he has pain.    Pertinent History  Patient is a 78 y.o. male who presents to outpatient physical therapy with a referral for medical diagnosis of radiculopathy of cervical region. This  patient's chief complaints consist of intermittent R UE pain and paresthesia, onset 3-4 years ago and gradually worsening leading to the following functional deficits: difficulty resting for prolonged periods in a chair or staying in air conditioning. He reports he can do most of his usual activities, but is bothered by the pain and paresthesia at rest and is concerned about it getting worse and limiting his function for using his R UE, which is his dominant hand. Relevant past medical history and comorbidities include stent placement 6-7 years ago (followed by cardiologist recently, things are going well, has 50# lifting limit), diabetes (does not know AIC, cannot remember last check, was staying about the same), GERD, repaired hiatial hernia, HTN, sleep apnea,  cholecystecomy, CAD, arthritis, appendectomy, hx of bilateral knee arthroplasty. Denies lung problems, epilepsy, or stroke.    Limitations  Other (comment);Sitting    How long can you sit comfortably?  Pain only when arms raised and it takes 10-15 min to be uncomfortable in neck and shoulder when his arms are raised. Otherwise not limited.    Diagnostic tests  See note from 05/09/2019 in subjective exam for summary of cervical and R shoulder radiographs    Patient Stated Goals  get better and be able to get rid of this pain and tingling    Currently in Pain?  Yes    Pain Score  1     Pain Location  Neck    Pain Orientation  Right;Left    Pain Descriptors / Indicators  Aching    Pain Type  Acute pain    Pain Radiating Towards  radiating intermittantly down R arm (better, and less frequent).    Pain Onset  More than a month ago    Pain Frequency  Intermittent    Aggravating Factors   bothers him more when he rests his arm on a high chair arm. Resting his arm on a chair arm if he sets there long enough. No longer bothered by air conditioning    Pain Relieving Factors  rubbing arm, letting his arm hang down, using arm.    Effect of Pain on Daily Activities  has trouble sitting for prolonged periods. being in air conditioning. Worried his R UE function will worsen if condition is allowed to get worse. This seems to be improving overall, not as worried and has not been aware of trouble sititng for long periods as much.        Subjective Assessment - 06/08/19 1119    Subjective  Patient reports he is having one of those days where he just feels like he has no energy. He reports bilateral UT are painful today and this started about 7 pm on Tuesday (day of last treatment session). Reports no parestheisa in either UE. He reports the upper trap pain feels like the pain he has had in the past. He has continued to perform his HEP. Patient reports that overall he feels like his pain is getting better. He has  not been paying attention to how long he can sit as much and he feels more confident his pain will continue to gte better. He feels his condition is about 30% better. He states his wife says he falls asleep in sitting position and ends up with his head hanging forward that may contribute to his pain. He doesn't even realize his head is down like this. He doesn't notice it being any more sore when this happens. Patient reports he was able to nearly abolish symptoms  with his exercises when he remembers to do them when he has pain.    Pertinent History  Patient is a 78 y.o. male who presents to outpatient physical therapy with a referral for medical diagnosis of radiculopathy of cervical region. This patient's chief complaints consist of intermittent R UE pain and paresthesia, onset 3-4 years ago and gradually worsening leading to the following functional deficits: difficulty resting for prolonged periods in a chair or staying in air conditioning. He reports he can do most of his usual activities, but is bothered by the pain and paresthesia at rest and is concerned about it getting worse and limiting his function for using his R UE, which is his dominant hand. Relevant past medical history and comorbidities include stent placement 6-7 years ago (followed by cardiologist recently, things are going well, has 50# lifting limit), diabetes (does not know AIC, cannot remember last check, was staying about the same), GERD, repaired hiatial hernia, HTN, sleep apnea, cholecystecomy, CAD, arthritis, appendectomy, hx of bilateral knee arthroplasty. Denies lung problems, epilepsy, or stroke.    Limitations  Other (comment);Sitting    How long can you sit comfortably?  Pain only when arms raised and it takes 10-15 min to be uncomfortable in neck and shoulder when his arms are raised. Otherwise not limited.    Diagnostic tests  See note from 05/09/2019 in subjective exam for summary of cervical and R shoulder radiographs     Patient Stated Goals  get better and be able to get rid of this pain and tingling    Currently in Pain?  Yes    Pain Score  1     Pain Location  Neck    Pain Orientation  Right;Left    Pain Descriptors / Indicators  Aching    Pain Type  Acute pain    Pain Radiating Towards  radiating intermittantly down R arm (better, and less frequent).    Pain Onset  More than a month ago    Pain Frequency  Intermittent    Aggravating Factors   bothers him more when he rests his arm on a high chair arm. Resting his arm on a chair arm if he sets there long enough. No longer bothered by air conditioning    Pain Relieving Factors  rubbing arm, letting his arm hang down, using arm.    Effect of Pain on Daily Activities  has trouble sitting for prolonged periods. being in air conditioning. Worried his R UE function will worsen if condition is allowed to get worse. This seems to be improving overall, not as worried and has not been aware of trouble sititng for long periods as much.       [facility] PT Assessment - 06/08/19 0001      Assessment   Medical Diagnosis  radiculopathy of cervical region    Referring Provider (PT)  Langston Reusing, MD    Onset Date/Surgical Date  05/09/15    Hand Dominance  Right    Prior Therapy  none for this problem prior to current episode of care      Precautions   Precautions  Fall;Other (comment)   notes he has 50# lifting limit due to heart stent   Precaution Comments  patient admits being a bit unsteady on feet      Restrictions   Weight Bearing Restrictions  No      Home Environment   Additional Comments  lives with wife at home      Prior Function  Level of Independence  Independent    Vocation  Full time employment;Other (comment)   full time during school year, off over summer   Vocation Requirements   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     Leisure   used to love to bowl, he is pretty active, works up  at the college in the summer time and fall. Bust up wood, use chain saw.       Cognition   Overall Cognitive Status  Within Functional Limits for tasks assessed   states he did not get an education and cannot read or write     Observation/Other Assessments   Observations  See note from 06/08/2019 for latest objective data       OBJECTIVE: OBSERVATION/INSPECTION: Patient presents with mildly slumped posture. Stiff thoracic kyphosis with slight hump on R compared to left.   Baseline pain: B UT soreness 1/10  SPINE MOTION Cervical Spine AROM:  *Indicates pain  Flexion: = 55 tight  Extension: = 55 tight (minimal lower cervical movement).  Rotation: R= 63, L = 65. (ipsilateral tightness at base of neck both sides)  Side Flexion: R= 30 tight at base of L neck, L = 35 tight at base of R neck  PERIPHERAL JOINT MOTION (AROM/PROM in degrees):  *Indicates pain BUE within functional limits and grossly equal, mild joint stiffness.   STRENGTH:  *Indicates pain  BUE grossly 4+/5 to 5/5 except R shoulder ER which was 4/5 but not painful.   Grip fair + bilaterally.   REPEATED MOTIONS TESTING:  Seated with back supported: repeated thoracic extension with clinician OP x 10 followed by seated cervical retraction with self OP to extension x 10 abolished symptoms.   PALPATION:  TTP at bilateral UT  EDUCATION/COGNITION: Patient is alert and oriented X 4. Patient cannot read or write (wife can for him).   Objective measurements completed on examination: See above findings.       TREATMENT:   Therapeutic exercise:to centralize symptoms and improve ROM, strength, muscular endurance, and activity tolerance required for successful completion of functional activities.Extensive tactile cues throughout exercises to maintain proper form this session - blood pressure measurement due to report of feeling like he has no energy today. Mildly low but wife states this is usual  range for him. (see above).  - Upper body ergometer with no added resistance encourage joint nutrition, warm tissue, induce analgesic effect of aerobic exercise, improve muscular strength and endurance,  and prepare for remainder of session. x5 minutes during subjective. Reports feeling better following. Discussed possible DOMS and appropriate response.  -Standing thoracic extension/BUE flexion and serratus anterior activation, lat stretch, withround rust-colored bolsterup wall, 5 second holds, cuing for technique. x10plus time for instruction, transitions, and appropriate rest. - Objective exam to assess progress (see above) - seated repeated upper thoracic spine extension over edge of chair with clinician OP x 10 (decreased B UT pain) Hands behind head to support head. Cuing for proper form.  - seated  in chair cervical retraction with self overpressureto extension2x 10.(abolished B UT pain) - standing B pec stretch in doorway. 3x30 seconds after extra time to teach technique and achieve acceptable form. Patient is very tight across anterior shoulders and had difficulty achieving correct form. Improved with cuing. - sitting lat pull down with isolated scapular depression prior to pull x 10 at 15%. To improve lower trap and lat strength and decrease overactivity of B UTs.  -  Education on diagnosis, prognosis, POC, anatomy and physiology of current condition.   HOME EXERCISE PROGRAM: Seated Posture with Lumbar Roll Place the lumbar roll as shown in the picture on the left. Slide your bottom to the back of the chair and sit up straight so the roll provides support in the natural curve of your lower back. Keep your shoulders back and head in a neutral position. Maintain this position at all time when sitting. If you have no pain or tingling: - Complete 10 times every hour If you have pain or tingling - complete 10 at a time, checking with yourself after each 10 if your symptoms are  better or worse. If they keep getting better, keep doing 10 at a time until the symptoms are gone or it is no longer getting better. If it stops getting better, can add a push with your fingers on your chin to get a better stretch. This works better with back supported in a chair or against the wall. Seated Thoracic Extension If you have no pain or tingling: - Complete 10 times every hour If you have pain or tingling - complete 10 at a time, checking with yourself after each 10 if your symptoms are better or worse. If they keep getting better, keep doing 10 at a time until the symptoms are gone or it is no longer getting better      PT Education - 06/08/19 1339    Education Details  Exercise purpose/form. Self management techniques. Education on diagnosis, prognosis, POC, anatomy and physiology of current condition    Person(s) Educated  Patient    Methods  Explanation;Demonstration;Tactile cues;Verbal cues    Comprehension  Verbalized understanding;Returned demonstration;Verbal cues required;Tactile cues required       PT Short Term Goals - 06/08/19 1258      PT SHORT TERM GOAL #1   Title  Be independent with initial home exercise program for self-management of symptoms.    Baseline  Initial HEP provided at IE (05/09/2019); patient is doing his HEP but forgets to use it to reduce his pain/discomfort when it occurs. Requires further education and reinforcement (06/08/2019);    Time  2    Period  Weeks    Status  Partially Met    Target Date  05/23/19        PT Long Term Goals - 06/08/19 1259      PT LONG TERM GOAL #1   Title  Be independent with a long-term home exercise program for self-management of symptoms.    Baseline  Initial HEP provided at initial eval (05/09/2019); patient is doing his HEP but forgets to use it to reduce his pain/discomfort when it occurs. Requires further education and reinforcement (06/08/2019);    Time  4    Period  Weeks    Status  Partially  Met    Target Date  07/06/19      PT LONG TERM GOAL #2   Title  Patient will be able to abolish symptoms at onset 4/5 times they occur in order to demonstrate strong self-management skills and independent management of current condition.    Baseline  Patient able to abolish in clinic (05/09/2019): patient able to abolish in clinic and reports abolishment or near abolishment when he remembers to do his exercises when he has pain at home (06/08/2019);    Time  4    Period  Weeks    Status  Partially Met    Target Date  07/06/19  PT LONG TERM GOAL #3   Title  Have at least 75% cervical spine AROM in all planes with no compensations or increase in pain in all planes except intermittent end range discomfort to allow patient to complete valued activities with less difficulty.    Baseline  see objective exam, most limited Extension: = 50 tight (minimal lower cervical movement; 630/2020); minimal improvement in motion but discomfort is more symmetrical (06/08/2019);    Time  4    Period  Weeks    Status  Partially Met    Target Date  07/06/19      PT LONG TERM GOAL #4   Title  Complete community, work and/or recreational activities without limitation due to current condition    Baseline  Patient has difficulty sitting for long periods of time, being in air conditioned environment, and resting his R arm on an arm rest (05/09/2019). patient no longer has pain in air conditioned environement, he has not been thinking about how long he can sit, and he is able to avoid R arm paresthesia by altering his sitting posture, states he is about 30% better (06/08/2019);    Time  4    Period  Weeks    Status  Partially Met    Target Date  07/06/19      PT LONG TERM GOAL #5   Title  Reduce pain with functional activities to equal or less than 1/10 to allow patient to complete usual activities including ADLs, IADLs, and social engagement with less difficulty.    Baseline  5/10 (05/09/2019); today 1/10 but is  intermittantly than thats.    Time  4    Period  Weeks    Status  New    Target Date  07/06/19            Plan - 06/08/19 1335    Clinical Impression Statement  Patient has attended 10 skilled physical therapy treatment sessions this episode of care and is making overall progress towards stated goals. Patient continues to have within visit decrease and abolishment of pain but has had intermittent success with carry over of improvement between sessions. He reports being able to abolish or nearly abolish symptoms with his HEP when he remembers to do it when he has pain. Today he has some bilateral soreness in bilateral upper traps that seems equal bilaterally and is likely related to advancement of postural strengthening exercise last session that may have caused DOMS. Since starting physical therapy patient has consistently reported near resolution of original R sided symptoms except some occasional R arm paresthesia associated with resting it on a chair arm but has developed more bothersome L sided upper trap pain that continues to be abolished with various activities in the clinic. Patient demonstrates significant poor postural endurance and overactivity of B upper traps when using arms that likely is contributing to discomfort in this area. Patient would benefit from continued postural re-training and strengthening to reduce pain and improve mechanics. Overall, patient demonstrates improvement in pain, activity tolerance, postural awareness, neuromuscular control, and understanding of self-management techniques. Patient continues to present with with significant postural strength/endurance, ROM, pain, and neuromuscular control impairments that are limiting ability to complete his usual activities such as sitting, reaching, and sleeping without difficulty. Patient will benefit from skilled physical therapy intervention to address current body structure impairments and activity limitations to improve  function and work towards goals set in current POC in order to return to prior level of function or maximal  functional improvement.    Personal Factors and Comorbidities  Age;Comorbidity 3+;Past/Current Experience;Education;Time since onset of injury/illness/exacerbation    Comorbidities  past medical history and comorbidities include stent placement 6-7 years ago (followed by cardiologist recently, things are going well, has 50# lifting limit), diabetes (does not know AIC, cannot remember last check, was staying about the same), GERD, repaired hiatial hernia, HTN, sleep apnea, cholecystecomy, CAD, arthritis, appendectomy, hx of bilateral knee arthroplasty. Denies lung problems, epilepsy, or stroke.    Examination-Activity Limitations  Sit;Other    Stability/Clinical Decision Making  Stable/Uncomplicated    Rehab Potential  Good    PT Frequency  2x / week    PT Duration  4 weeks    PT Treatment/Interventions  ADLs/Self Care Home Management;Cryotherapy;Moist Heat;Therapeutic activities;Therapeutic exercise;Neuromuscular re-education;Patient/family education;Manual techniques;Passive range of motion;Dry needling;Spinal Manipulations;Joint Manipulations;Other (comment)    PT Next Visit Plan  more exercises for upper thoracic mobility and postural strength/motor control    PT Home Exercise Plan  see note    Consulted and Agree with Plan of Care  Patient       Patient will benefit from skilled therapeutic intervention in order to improve the following deficits and impairments:  Decreased mobility, Increased muscle spasms, Impaired sensation, Cardiopulmonary status limiting activity, Decreased range of motion, Decreased activity tolerance, Pain, Decreased strength, Impaired flexibility  Visit Diagnosis: 1. Radiculopathy, cervical region   2. Cervicalgia   3. Paresthesia of skin        Problem List There are no active problems to display for this patient.   Everlean Alstrom. Graylon Good, PT, DPT 06/08/19,  1:40 PM  [facility] [address], [place], [address] Phone: [phone]   Fax:  [phone]  Name: ANDRIK SANDT [name] MRN: [id number] Date of Birth: Aug 30, 1941

## 2019-06-13 ENCOUNTER — Ambulatory Visit: Payer: Medicare Other | Attending: Orthopedic Surgery | Admitting: Physical Therapy

## 2019-06-13 ENCOUNTER — Other Ambulatory Visit: Payer: Self-pay

## 2019-06-13 ENCOUNTER — Encounter: Payer: Self-pay | Admitting: Physical Therapy

## 2019-06-13 DIAGNOSIS — M542 Cervicalgia: Secondary | ICD-10-CM

## 2019-06-13 DIAGNOSIS — R202 Paresthesia of skin: Secondary | ICD-10-CM | POA: Diagnosis present

## 2019-06-13 DIAGNOSIS — M5412 Radiculopathy, cervical region: Secondary | ICD-10-CM | POA: Diagnosis present

## 2019-06-13 NOTE — Therapy (Signed)
[facility] [address] [place], [address] Phone: [phone]   Fax:  [phone]  Physical Therapy Treatment  Patient Details  Name: Bobby Warner. MRN: [id number] Date of Birth: 1941-06-25 Referring Provider (PT): Langston Reusing, MD   Encounter Date: 06/13/2019  PT End of Session - 06/13/19 1110    Visit Number  11    Number of Visits  18    Date for PT Re-Evaluation  07/06/19    Authorization Type  Medicare reporting period from 05/09/2019 (new reporting period 06/08/2019)    Authorization Time Period  Current cert period: 9/73/5329 - 06/06/2019 (latest PN: 06/08/2019);    Authorization - Visit Number  1    Authorization - Number of Visits  10    PT Start Time  9242    PT Stop Time  1145    PT Time Calculation (min)  40 min    Activity Tolerance  Patient tolerated treatment well    Behavior During Therapy  WFL for tasks assessed/performed       Past Medical History:  Diagnosis Date  . Arthritis   . Back pain   . Coronary artery disease   . Diabetes mellitus without complication ([place])   . GERD (gastroesophageal reflux disease)   . History of hiatal hernia   . Hyperlipidemia   . Hypertension   . Sleep apnea     Past Surgical History:  Procedure Laterality Date  . APPENDECTOMY    . CARDIAC CATHETERIZATION    . CHOLECYSTECTOMY    . COLONOSCOPY WITH ESOPHAGOGASTRODUODENOSCOPY (EGD)    . COLONOSCOPY WITH PROPOFOL N/A 03/16/2016   Procedure: COLONOSCOPY WITH PROPOFOL;  Surgeon: Lollie Sails, MD;  Location: [facility] ENDOSCOPY;  Service: Endoscopy;  Laterality: N/A;  . EYE SURGERY    . HERNIA REPAIR    . NISSEN FUNDOPLICATION    . testicular vein surgery    . TONSILLECTOMY    . TOTAL KNEE ARTHROPLASTY      There were no vitals filed for this visit.  Subjective Assessment - 06/13/19 1106    Subjective  Patient reports he has minimal discomfort today in bilateral upper traps, not even  quite a 1/10. No tingling in the R arm. States there was a day last week where he felt pretty poorly, thinks it was the day after his last treatment session. He felt generally rundown, It was not neck pain, he just didn't feel good. Reports he is feeling much better today but is going to the doctor because he continues to have intermittant days where he doesn't feel well.    Pertinent History  Patient is a 78 y.o. male who presents to outpatient physical therapy with a referral for medical diagnosis of radiculopathy of cervical region. This patient's chief complaints consist of intermittent R UE pain and paresthesia, onset 3-4 years ago and gradually worsening leading to the following functional deficits: difficulty resting for prolonged periods in a chair or staying in air conditioning. He reports he can do most of his usual activities, but is bothered by the pain and paresthesia at rest and is concerned about it getting worse and limiting his function for using his R UE, which is his dominant hand. Relevant past medical history and comorbidities include stent placement 6-7 years ago (followed by cardiologist recently, things are going well, has 50# lifting limit), diabetes (does not know AIC, cannot remember last check, was staying about the same), GERD, repaired  hiatial hernia, HTN, sleep apnea, cholecystecomy, CAD, arthritis, appendectomy, hx of bilateral knee arthroplasty. Denies lung problems, epilepsy, or stroke.    Limitations  Other (comment);Sitting    How long can you sit comfortably?  Pain only when arms raised and it takes 10-15 min to be uncomfortable in neck and shoulder when his arms are raised. Otherwise not limited.    Diagnostic tests  See note from 05/09/2019 in subjective exam for summary of cervical and R shoulder radiographs    Patient Stated Goals  get better and be able to get rid of this pain and tingling    Currently in Pain?  No/denies    Pain Onset  More than a month ago           TREATMENT:   Therapeutic exercise:to centralize symptoms and improve ROM, strength, muscular endurance, and activity tolerance required for successful completion of functional activities.Extensive tactile cues throughout exercises to maintain proper form this session -Standing thoracic extension/BUE flexion and serratus anterior activation, lat stretch, withround rust-colored bolsterup wall, 5 second holds, cuing for technique. x10plus time for instruction, transitions, and appropriate rest. - seated repeated upper thoracic spine extension over edge of chair with clinician OP x 10 (no pain before or after) Hands behind head to support head. Cuing for proper form.  - seated  in chair cervical retraction with self overpressureto extensionx 10.(no pain at start or after) - scapular depression, seated in chair pressing forearms and elbows into chair arms to lift buttocks off chair ~ 1 inch. 2x10 to improve scapular depression muscles and decrease tension B UT.  - rows seated on edge of chair on dynadisc without back support with cuing for scapular depression, posterior tilt and depression. 2x10 with red theraband and extensive cuing to improve posture. Cuing to move elbows towards chair arms.  - bilateral shoulder ER seated on edge of chair on dynadisc without back support with cuing for scapular depression, posterior tilt and depression. Forearms supinated. Paper held by upper arm at sides to improve form and cuing to keep elbows close to chair arms. 2x10 with red theraband.  - standing B pec stretch in doorway. 3x30 seconds after extra time to teach technique and achieve acceptable form. Patient is very tight across anterior shoulders and had difficulty achieving correct form. Improved with cuing. - prone with head resting in blue cradle: scapular depression/retraction/posterior tilt to shoulder extension with backs of hands to the ceiling (anatomical position). To improve lower  trap and retraction strength. Cuing for posture and technique.2X10 (manual therapy - see below) - prone with head resting in blue cradle. Bilateral horizontal shoulder abduction with scapulular retraction focusing on retraction, depression, and posterior tilt. x10 with tactile cues to decrease UT activity and increase mid and low trap activation. Continues to demonstrate over-activity of upper traps.  - bilateral horizontal shoulder abduction with red theraband in supine position with no pillow or support under LEs. Red theraband x 10 with extensive cuing to find scapular depression instead of elevation with movement and additional time to learn exercise. To improve awareness, motor control, and strength for improved scapular mechanics to reduce over-activation of B UTs with UE use.   Manual therapy:to reduce pain and tissue tension, improve range of motion, neuromodulation, in order to promote improved ability to complete functional activities. - prone with head in blue cradle CPA  along thoracic spine through cervical spine. x10 reps each segment (pt reports no discomfort today). Grades II-IV.  HOME EXERCISE PROGRAM: Seated Posture  with Lumbar Roll Place the lumbar roll as shown in the picture on the left. Slide your bottom to the back of the chair and sit up straight so the roll provides support in the natural curve of your lower back. Keep your shoulders back and head in a neutral position. Maintain this position at all time when sitting. If you have no pain or tingling: - Complete 10 times every hour If you have pain or tingling - complete 10 at a time, checking with yourself after each 10 if your symptoms are better or worse. If they keep getting better, keep doing 10 at a time until the symptoms are gone or it is no longer getting better. If it stops getting better, can add a push with your fingers on your chin to get a better stretch. This works better with back supported in  a chair or against the wall. Seated Thoracic Extension If you have no pain or tingling: - Complete 10 times every hour If you have pain or tingling - complete 10 at a time, checking with yourself after each 10 if your symptoms are better or worse. If they keep getting better, keep doing 10 at a time until the symptoms are gone or it is no longer getting better    PT Education - 06/13/19 1155    Education Details  exercise purpose/form    Person(s) Educated  Patient    Methods  Explanation;Demonstration;Tactile cues;Verbal cues    Comprehension  Verbalized understanding;Returned demonstration;Verbal cues required;Tactile cues required       PT Short Term Goals - 06/08/19 1258      PT SHORT TERM GOAL #1   Title  Be independent with initial home exercise program for self-management of symptoms.    Baseline  Initial HEP provided at IE (05/09/2019); patient is doing his HEP but forgets to use it to reduce his pain/discomfort when it occurs. Requires further education and reinforcement (06/08/2019);    Time  2    Period  Weeks    Status  Partially Met    Target Date  05/23/19        PT Long Term Goals - 06/08/19 1259      PT LONG TERM GOAL #1   Title  Be independent with a long-term home exercise program for self-management of symptoms.    Baseline  Initial HEP provided at initial eval (05/09/2019); patient is doing his HEP but forgets to use it to reduce his pain/discomfort when it occurs. Requires further education and reinforcement (06/08/2019);    Time  4    Period  Weeks    Status  Partially Met    Target Date  07/06/19      PT LONG TERM GOAL #2   Title  Patient will be able to abolish symptoms at onset 4/5 times they occur in order to demonstrate strong self-management skills and independent management of current condition.    Baseline  Patient able to abolish in clinic (05/09/2019): patient able to abolish in clinic and reports abolishment or near abolishment when he  remembers to do his exercises when he has pain at home (06/08/2019);    Time  4    Period  Weeks    Status  Partially Met    Target Date  07/06/19      PT LONG TERM GOAL #3   Title  Have at least 75% cervical spine AROM in all planes with no compensations or increase in pain in all planes except  intermittent end range discomfort to allow patient to complete valued activities with less difficulty.    Baseline  see objective exam, most limited Extension: = 50 tight (minimal lower cervical movement; 630/2020); minimal improvement in motion but discomfort is more symmetrical (06/08/2019);    Time  4    Period  Weeks    Status  Partially Met    Target Date  07/06/19      PT LONG TERM GOAL #4   Title  Complete community, work and/or recreational activities without limitation due to current condition    Baseline  Patient has difficulty sitting for long periods of time, being in air conditioned environment, and resting his R arm on an arm rest (05/09/2019). patient no longer has pain in air conditioned environement, he has not been thinking about how long he can sit, and he is able to avoid R arm paresthesia by altering his sitting posture, states he is about 30% better (06/08/2019);    Time  4    Period  Weeks    Status  Partially Met    Target Date  07/06/19      PT LONG TERM GOAL #5   Title  Reduce pain with functional activities to equal or less than 1/10 to allow patient to complete usual activities including ADLs, IADLs, and social engagement with less difficulty.    Baseline  5/10 (05/09/2019); today 1/10 but is intermittantly than thats.    Time  4    Period  Weeks    Status  New    Target Date  07/06/19            Plan - 06/13/19 1205    Clinical Impression Statement  Patient tolerated treatment well and demonstrates improved carry over in pain reduction from prior visit. Pt was able to complete all exercises with minimal to no lasting increase in pain or discomfort. Pt required  multimodal cuing for proper technique and to facilitate improved neuromuscular control, strength, range of motion, and functional ability resulting in improved performance and form. He continues to demonstrate stooped posture with most upright activities and overestimation of B UT when using B UE. Focused on interventions to improve posture and motor control for decreased use of B UT during functional activities. Patient struggled to voluntarily relax B UT but showed signs of improved awareness and motor control with extensive cuing and practice. Patient would benefit from continued physical therapy to address remaining impairments and functional limitations to work towards stated goals and return to PLOF or maximal functional independence.    Personal Factors and Comorbidities  Age;Comorbidity 3+;Past/Current Experience;Education;Time since onset of injury/illness/exacerbation    Comorbidities  past medical history and comorbidities include stent placement 6-7 years ago (followed by cardiologist recently, things are going well, has 50# lifting limit), diabetes (does not know AIC, cannot remember last check, was staying about the same), GERD, repaired hiatial hernia, HTN, sleep apnea, cholecystecomy, CAD, arthritis, appendectomy, hx of bilateral knee arthroplasty. Denies lung problems, epilepsy, or stroke.    Examination-Activity Limitations  Sit;Other    Stability/Clinical Decision Making  Stable/Uncomplicated    Rehab Potential  Good    PT Frequency  2x / week    PT Duration  4 weeks    PT Treatment/Interventions  ADLs/Self Care Home Management;Cryotherapy;Moist Heat;Therapeutic activities;Therapeutic exercise;Neuromuscular re-education;Patient/family education;Manual techniques;Passive range of motion;Dry needling;Spinal Manipulations;Joint Manipulations;Other (comment)    PT Next Visit Plan  more exercises for upper thoracic mobility and postural strength/motor control    PT  Home Exercise Plan  see  note    Consulted and Agree with Plan of Care  Patient       Patient will benefit from skilled therapeutic intervention in order to improve the following deficits and impairments:  Decreased mobility, Increased muscle spasms, Impaired sensation, Cardiopulmonary status limiting activity, Decreased range of motion, Decreased activity tolerance, Pain, Decreased strength, Impaired flexibility  Visit Diagnosis: 1. Radiculopathy, cervical region   2. Cervicalgia   3. Paresthesia of skin        Problem List There are no active problems to display for this patient.   Everlean Alstrom. Graylon Good, PT, DPT 06/13/19, 12:07 PM   [facility] [address], [place], [address] Phone: [phone]   Fax:  [phone]  Name: CHAMPION CORALES Warner. MRN: [id number] Date of Birth: 29-Nov-1940

## 2019-06-20 ENCOUNTER — Encounter: Payer: Self-pay | Admitting: Pulmonary Disease

## 2019-06-20 ENCOUNTER — Ambulatory Visit (INDEPENDENT_AMBULATORY_CARE_PROVIDER_SITE_OTHER): Payer: Medicare Other | Admitting: Pulmonary Disease

## 2019-06-20 ENCOUNTER — Encounter: Payer: Self-pay | Admitting: Physical Therapy

## 2019-06-20 ENCOUNTER — Ambulatory Visit: Payer: Medicare Other | Admitting: Physical Therapy

## 2019-06-20 ENCOUNTER — Other Ambulatory Visit: Payer: Self-pay

## 2019-06-20 VITALS — BP 122/70 | HR 65 | Temp 97.2°F | Ht 68.5 in | Wt 181.6 lb

## 2019-06-20 DIAGNOSIS — M5412 Radiculopathy, cervical region: Secondary | ICD-10-CM | POA: Diagnosis not present

## 2019-06-20 DIAGNOSIS — Z789 Other specified health status: Secondary | ICD-10-CM | POA: Diagnosis not present

## 2019-06-20 DIAGNOSIS — M542 Cervicalgia: Secondary | ICD-10-CM

## 2019-06-20 DIAGNOSIS — G4733 Obstructive sleep apnea (adult) (pediatric): Secondary | ICD-10-CM

## 2019-06-20 DIAGNOSIS — R202 Paresthesia of skin: Secondary | ICD-10-CM

## 2019-06-20 NOTE — Progress Notes (Signed)
PULMONARY/SLEEP OFFICE FOLLOW-UP NOTE  Requesting MD/Service: Anderson Date of initial consultation: 07/12/18  Reason for consultation: OSA  PT PROFILE: 78 y.o. male remote smoker with recent diagnosis of OSA  DATA: 04/28/18 PSG: AHI 28/hr 05/09/18 Titration study: optimal CPAP 13 cm H2O  INTERVAL: No major events  SUBJ: This is a scheduled follow up for further evaluation and management of obstructive sleep apnea.  He continues to have difficulty tolerating CPAP.  The main cause of difficulty is poor mask fit.  He has tenderness at the bridge of his nose and has problems with mask leak.  We previously sent him to the mask fit clinic and he is wearing a full facemask.  Nasal mask was complicated by mouth opening during sleep.  He has never been evaluated for nasal pillows.  He continues to have moderate daytime hypersomnolence and generalized fatigue.  Otherwise, he has no new complaints.  He denies CP, fever, purulent sputum, hemoptysis, LE edema and calf tenderness.  Vitals:   06/20/19 1342  BP: 122/70  Pulse: 65  Temp: (!) 97.2 F (36.2 C)  TempSrc: Temporal  SpO2: 96%  Weight: 181 lb 9.6 oz (82.4 kg)  Height: 5' 8.5" (1.74 m)  RA  EXAM:  Gen: NAD HEENT: NCAT, sclerae white Neck: No JVD Lungs: breath sounds full, no wheezes or other adventitious sounds Cardiovascular: RRR, soft systolic murmur Abdomen: Soft, nontender, normal BS Ext: without clubbing, cyanosis, edema Neuro: grossly intact Skin: Limited exam, no lesions noted     DATA:   BMP Latest Ref Rng & Units 04/18/2013 06/20/2012 06/08/2012  Glucose 65 - 99 mg/dL 159(H) 215(H) 158(H)  BUN 7 - 18 mg/dL 8 13 14   Creatinine 0.60 - 1.30 mg/dL 1.08 1.13 1.14  Sodium 136 - 145 mmol/L 141 141 137  Potassium 3.5 - 5.1 mmol/L 3.7 3.8 5.0  Chloride 98 - 107 mmol/L 105 105 99  CO2 21 - 32 mmol/L 30 27 35(H)  Calcium 8.5 - 10.1 mg/dL 8.6 8.6 9.3    CBC Latest Ref Rng & Units 04/18/2013 06/20/2012 06/20/2012  WBC 3.8  - 10.6 x10 3/mm 3 4.2 7.0 6.7  Hemoglobin 13.0 - 18.0 g/dL 13.4 13.4 13.6  Hematocrit 40.0 - 52.0 % 38.5(L) 38.8(L) 40.2  Platelets 150 - 440 x10 3/mm 3 162 112(L) 137(L)    CXR:  No recent film  I have personally reviewed all chest radiographs reported above including CXRs and CT chest unless otherwise indicated  IMPRESSION:     ICD-10-CM   1. OSA (obstructive sleep apnea)  G47.33 Desensitization mask fit  2. Intolerance of CPAP due to difficulty with mask fit  Z78.9 Desensitization mask fit     PLAN: Cotinue CPAP Autoset 5-15 cm H2O  Referral back to mask fit clinic.  Consider nasal pillows with chinstrap  Follow up in this clinic in 3-4 months with Dr Pincus Sanes, MD PCCM service Mobile [phone] Pager [phone] 06/21/2019 12:15 PM

## 2019-06-20 NOTE — Therapy (Signed)
[facility] [address] [place], [address] Phone: [phone]   Fax:  [phone]  Physical Therapy Treatment  Patient Details  Name: ARMIN YERGER [name] MRN: [id number] Date of Birth: 1941-09-26 Referring Provider (PT): Langston Reusing, MD   Encounter Date: 06/20/2019  PT End of Session - 06/20/19 1047    Visit Number  12    Number of Visits  18    Date for PT Re-Evaluation  07/06/19    Authorization Type  Medicare reporting period 06/08/2019    Authorization Time Period  Current cert period: 3/55/7322 - 06/06/2019 (latest PN: 06/08/2019);    Authorization - Visit Number  2    Authorization - Number of Visits  10    PT Start Time  [id number]    PT Stop Time  1030    PT Time Calculation (min)  40 min    Activity Tolerance  Patient tolerated treatment well    Behavior During Therapy  WFL for tasks assessed/performed       Past Medical History:  Diagnosis Date  . Arthritis   . Back pain   . Coronary artery disease   . Diabetes mellitus without complication ([place])   . GERD (gastroesophageal reflux disease)   . History of hiatal hernia   . Hyperlipidemia   . Hypertension   . Sleep apnea     Past Surgical History:  Procedure Laterality Date  . APPENDECTOMY    . CARDIAC CATHETERIZATION    . CHOLECYSTECTOMY    . COLONOSCOPY WITH ESOPHAGOGASTRODUODENOSCOPY (EGD)    . COLONOSCOPY WITH PROPOFOL N/A 03/16/2016   Procedure: COLONOSCOPY WITH PROPOFOL;  Surgeon: Lollie Sails, MD;  Location: [facility] ENDOSCOPY;  Service: Endoscopy;  Laterality: N/A;  . EYE SURGERY    . HERNIA REPAIR    . NISSEN FUNDOPLICATION    . testicular vein surgery    . TONSILLECTOMY    . TOTAL KNEE ARTHROPLASTY      There were no vitals filed for this visit.  Subjective Assessment - 06/20/19 1045    Subjective  Patient reports he is feeling well today with no pain or paresthesia for at least two days. Reports he felt fine  following last treatment sesison. States his wife said she noticed he has been sitting up with better posture since last session. States HEP is going well.    Pertinent History  Patient is a 78 y.o. male who presents to outpatient physical therapy with a referral for medical diagnosis of radiculopathy of cervical region. This patient's chief complaints consist of intermittent R UE pain and paresthesia, onset 3-4 years ago and gradually worsening leading to the following functional deficits: difficulty resting for prolonged periods in a chair or staying in air conditioning. He reports he can do most of his usual activities, but is bothered by the pain and paresthesia at rest and is concerned about it getting worse and limiting his function for using his R UE, which is his dominant hand. Relevant past medical history and comorbidities include stent placement 6-7 years ago (followed by cardiologist recently, things are going well, has 50# lifting limit), diabetes (does not know AIC, cannot remember last check, was staying about the same), GERD, repaired hiatial hernia, HTN, sleep apnea, cholecystecomy, CAD, arthritis, appendectomy, hx of bilateral knee arthroplasty. Denies lung problems, epilepsy, or stroke.    Limitations  Other (comment);Sitting    How long can you sit comfortably?  Pain only when arms  raised and it takes 10-15 min to be uncomfortable in neck and shoulder when his arms are raised. Otherwise not limited.    Diagnostic tests  See note from 05/09/2019 in subjective exam for summary of cervical and R shoulder radiographs    Patient Stated Goals  get better and be able to get rid of this pain and tingling    Currently in Pain?  No/denies    Pain Onset  More than a month ago       TREATMENT:   Therapeutic exercise:to centralize symptoms and improve ROM, strength, muscular endurance, and activity tolerance required for successful completion of functional activities.Extensive tactile cues  throughout exercises to maintain proper form this session -Standing thoracic extension/BUE flexion and serratus anterior activation, lat stretch, withround rust-colored bolsterup wall, 5 second holds, cuing for technique. x10plus time for instruction, transitions, and appropriate rest. - seated repeated upper thoracic spine extension over edge of chair with clinician OP x 10 (no pain before or after) Hands behind head to support head. Cuing for proper form. - seated in chair cervical retraction with self overpressureto extensionx 10.(no pain at start or after)  Circuit 1: - scapular depression, seated in chair pressing forearms and elbows into chair arms to lift buttocks off chair ~ 1 inch. 3x10 to improve scapular depression muscles and decrease tension B UT.  - rows seated on edge of chair on dynadisc without back support with cuing for scapular depression, posterior tilt and depression. 3x10 with red theraband and extensive cuing to improve posture. Cuing to move elbows towards chair arms.  - bilateral shoulder ER seated on edge of chair on dynadisc without back support with cuing for scapular depression, posterior tilt and depression. Forearms supinated. Paper held by upper arm at sides to improve form and cuing to keep elbows close to chair arms. 3x10 with red theraband.   Continued exercise: - standing B pec stretch in doorway. 3x30 seconds after extra time to teach technique and achieve acceptable form. Patient is very tight across anterior shoulders and had difficulty achieving correct form. Improved with cuing. - prone with head resting in blue cradle: scapular depression/retraction/posterior tilt to shoulder extension with backs of hands to the ceiling (anatomical position). To improve lower trap and retraction strength. Cuing for posture and technique.3X10 (manual therapy - see below) - bilateral horizontal shoulder abduction with red theraband in supine position with no pillow  or support under LEs. Red theraband x 10 with extensive cuing to find scapular depression instead of elevation with movement and additional time to learn exercise. To improve awareness, motor control, and strength for improved scapular mechanics to reduce over-activation of B UTs with UE use.   Manual therapy:to reduce pain and tissue tension, improve range of motion, neuromodulation, in order to promote improved ability to complete functional activities. - prone with head in blue cradle CPA along thoracic spine through cervical spine. x10 reps each segment (pt reports no discomfort today). Grades II-IV.  HOME EXERCISE PROGRAM: Seated Posture with Lumbar Roll Place the lumbar roll as shown in the picture on the left. Slide your bottom to the back of the chair and sit up straight so the roll provides support in the natural curve of your lower back. Keep your shoulders back and head in a neutral position. Maintain this position at all time when sitting. If you have no pain or tingling: - Complete 10 times every hour If you have pain or tingling - complete 10 at a time, checking with  yourself after each 10 if your symptoms are better or worse. If they keep getting better, keep doing 10 at a time until the symptoms are gone or it is no longer getting better. If it stops getting better, can add a push with your fingers on your chin to get a better stretch. This works better with back supported in a chair or against the wall. Seated Thoracic Extension If you have no pain or tingling: - Complete 10 times every hour If you have pain or tingling - complete 10 at a time, checking with yourself after each 10 if your symptoms are better or worse. If they keep getting better, keep doing 10 at a time until the symptoms are gone or it is no longer getting better    PT Education - 06/20/19 1047    Education Details  exercise purpose/form    Person(s) Educated  Patient    Methods   Explanation;Demonstration;Tactile cues    Comprehension  Verbalized understanding;Returned demonstration;Verbal cues required       PT Short Term Goals - 06/08/19 1258      PT SHORT TERM GOAL #1   Title  Be independent with initial home exercise program for self-management of symptoms.    Baseline  Initial HEP provided at IE (05/09/2019); patient is doing his HEP but forgets to use it to reduce his pain/discomfort when it occurs. Requires further education and reinforcement (06/08/2019);    Time  2    Period  Weeks    Status  Partially Met    Target Date  05/23/19        PT Long Term Goals - 06/08/19 1259      PT LONG TERM GOAL #1   Title  Be independent with a long-term home exercise program for self-management of symptoms.    Baseline  Initial HEP provided at initial eval (05/09/2019); patient is doing his HEP but forgets to use it to reduce his pain/discomfort when it occurs. Requires further education and reinforcement (06/08/2019);    Time  4    Period  Weeks    Status  Partially Met    Target Date  07/06/19      PT LONG TERM GOAL #2   Title  Patient will be able to abolish symptoms at onset 4/5 times they occur in order to demonstrate strong self-management skills and independent management of current condition.    Baseline  Patient able to abolish in clinic (05/09/2019): patient able to abolish in clinic and reports abolishment or near abolishment when he remembers to do his exercises when he has pain at home (06/08/2019);    Time  4    Period  Weeks    Status  Partially Met    Target Date  07/06/19      PT LONG TERM GOAL #3   Title  Have at least 75% cervical spine AROM in all planes with no compensations or increase in pain in all planes except intermittent end range discomfort to allow patient to complete valued activities with less difficulty.    Baseline  see objective exam, most limited Extension: = 50 tight (minimal lower cervical movement; 630/2020); minimal improvement  in motion but discomfort is more symmetrical (06/08/2019);    Time  4    Period  Weeks    Status  Partially Met    Target Date  07/06/19      PT LONG TERM GOAL #4   Title  Complete community, work and/or recreational activities  without limitation due to current condition    Baseline  Patient has difficulty sitting for long periods of time, being in air conditioned environment, and resting his R arm on an arm rest (05/09/2019). patient no longer has pain in air conditioned environement, he has not been thinking about how long he can sit, and he is able to avoid R arm paresthesia by altering his sitting posture, states he is about 30% better (06/08/2019);    Time  4    Period  Weeks    Status  Partially Met    Target Date  07/06/19      PT LONG TERM GOAL #5   Title  Reduce pain with functional activities to equal or less than 1/10 to allow patient to complete usual activities including ADLs, IADLs, and social engagement with less difficulty.    Baseline  5/10 (05/09/2019); today 1/10 but is intermittantly than thats.    Time  4    Period  Weeks    Status  New    Target Date  07/06/19            Plan - 06/20/19 1050    Clinical Impression Statement  Patient tolerated treatment well with no pain reported any time throughout the session. Noted for significantly improved postural and scapular control and awareness. Continues to struggle not to over-activate upper traps, but much improved from last session and required less cuing. Able to complete more reps for most exercises. Still unable to complete prone horizontal shoulder abduction with proper scapular mechanics without overactivity of bilateral upper traps. Pt required multimodal cuing for proper technique and to facilitate improved neuromuscular control, strength, range of motion, and functional ability resulting in improved performance and form. Patient would benefit from continued physical therapy to address remaining impairments and  functional limitations to work towards stated goals and return to PLOF or maximal functional independence.    Personal Factors and Comorbidities  Age;Comorbidity 3+;Past/Current Experience;Education;Time since onset of injury/illness/exacerbation    Comorbidities  past medical history and comorbidities include stent placement 6-7 years ago (followed by cardiologist recently, things are going well, has 50# lifting limit), diabetes (does not know AIC, cannot remember last check, was staying about the same), GERD, repaired hiatial hernia, HTN, sleep apnea, cholecystecomy, CAD, arthritis, appendectomy, hx of bilateral knee arthroplasty. Denies lung problems, epilepsy, or stroke.    Examination-Activity Limitations  Sit;Other    Stability/Clinical Decision Making  Stable/Uncomplicated    Rehab Potential  Good    PT Frequency  2x / week    PT Duration  4 weeks    PT Treatment/Interventions  ADLs/Self Care Home Management;Cryotherapy;Moist Heat;Therapeutic activities;Therapeutic exercise;Neuromuscular re-education;Patient/family education;Manual techniques;Passive range of motion;Dry needling;Spinal Manipulations;Joint Manipulations;Other (comment)    PT Next Visit Plan  more exercises for upper thoracic mobility and postural strength/motor control    PT Home Exercise Plan  see note    Consulted and Agree with Plan of Care  Patient       Patient will benefit from skilled therapeutic intervention in order to improve the following deficits and impairments:  Decreased mobility, Increased muscle spasms, Impaired sensation, Cardiopulmonary status limiting activity, Decreased range of motion, Decreased activity tolerance, Pain, Decreased strength, Impaired flexibility  Visit Diagnosis: 1. Radiculopathy, cervical region   2. Cervicalgia   3. Paresthesia of skin        Problem List There are no active problems to display for this patient.   Everlean Alstrom. Graylon Good, PT, DPT 06/20/19, 10:52 AM  [facility] [address], [place], [address] Phone: [phone]   Fax:  [phone]  Name: LOGHAN KURTZMAN [name] MRN: [id number] Date of Birth: October 28, 1941

## 2019-06-20 NOTE — Patient Instructions (Signed)
Cotinue CPAP Autoset 5-15 cm H2O  Referral back to mask fit clinic  Follow up in this clinic in 3-4 months with Dr Ashby Dawes

## 2019-06-22 ENCOUNTER — Other Ambulatory Visit: Payer: Self-pay | Admitting: Gastroenterology

## 2019-06-22 DIAGNOSIS — Z7902 Long term (current) use of antithrombotics/antiplatelets: Secondary | ICD-10-CM

## 2019-06-22 DIAGNOSIS — R1310 Dysphagia, unspecified: Secondary | ICD-10-CM

## 2019-06-22 DIAGNOSIS — K219 Gastro-esophageal reflux disease without esophagitis: Secondary | ICD-10-CM

## 2019-06-26 ENCOUNTER — Telehealth: Payer: Self-pay | Admitting: Pulmonary Disease

## 2019-06-26 NOTE — Telephone Encounter (Signed)
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 Instead of going to [facility] for a COVID test, someone told him that they could just do the test here in [place] rather than driving to [place].

## 2019-06-26 NOTE — Telephone Encounter (Signed)
Request sent to scheduling for COVID test.Will contact pt with date and time once scheduled.

## 2019-06-26 NOTE — Telephone Encounter (Signed)
Contacted pt and advised that he will be able to come to [facility] for his COVID testing as requested. Pt was advised that we will be contacting him with a date and time for his covid testing a few days prior to his testing date.Understanding verbalized.

## 2019-06-26 NOTE — Telephone Encounter (Signed)
No I don't schedule or arrange any COVID testing.  If the mask fit appointment is in [place] at the [facility] then the [facility] will schedule the COVID test, is my understanding. Rhonda J Cobb

## 2019-06-27 ENCOUNTER — Encounter: Payer: Self-pay | Admitting: Physical Therapy

## 2019-06-27 ENCOUNTER — Ambulatory Visit: Payer: Medicare Other | Admitting: Physical Therapy

## 2019-06-27 ENCOUNTER — Other Ambulatory Visit: Payer: Self-pay

## 2019-06-27 DIAGNOSIS — M5412 Radiculopathy, cervical region: Secondary | ICD-10-CM

## 2019-06-27 DIAGNOSIS — R202 Paresthesia of skin: Secondary | ICD-10-CM

## 2019-06-27 DIAGNOSIS — M542 Cervicalgia: Secondary | ICD-10-CM

## 2019-06-27 NOTE — Therapy (Signed)
[facility] [address] [place], [address] Phone: [phone]   Fax:  [phone]  Physical Therapy Treatment  Patient Details  Name: Bobby ZICK [name] MRN: [id number] Date of Birth: 05-10-1941 Referring Provider (PT): Langston Reusing, MD   Encounter Date: 06/27/2019  PT End of Session - 06/27/19 1131    Visit Number  13    Number of Visits  18    Date for PT Re-Evaluation  07/06/19    Authorization Type  Medicare reporting period from 06/08/2019    Authorization Time Period  Current cert period: 2/35/5732 - 07/06/2019 (latest PN: 06/08/2019);    Authorization - Visit Number  3    Authorization - Number of Visits  10    PT Start Time  1125    PT Stop Time  1205    PT Time Calculation (min)  40 min    Activity Tolerance  Patient tolerated treatment well    Behavior During Therapy  WFL for tasks assessed/performed       Past Medical History:  Diagnosis Date  . Arthritis   . Back pain   . Coronary artery disease   . Diabetes mellitus without complication ([place])   . GERD (gastroesophageal reflux disease)   . History of hiatal hernia   . Hyperlipidemia   . Hypertension   . Sleep apnea     Past Surgical History:  Procedure Laterality Date  . APPENDECTOMY    . CARDIAC CATHETERIZATION    . CHOLECYSTECTOMY    . COLONOSCOPY WITH ESOPHAGOGASTRODUODENOSCOPY (EGD)    . COLONOSCOPY WITH PROPOFOL N/A 03/16/2016   Procedure: COLONOSCOPY WITH PROPOFOL;  Surgeon: Bobby Sails, MD;  Location: [facility];  Service: Endoscopy;  Laterality: N/A;  . EYE SURGERY    . HERNIA REPAIR    . NISSEN FUNDOPLICATION    . testicular vein surgery    . TONSILLECTOMY    . TOTAL KNEE ARTHROPLASTY      There were no vitals filed for this visit.  Subjective Assessment - 06/27/19 1129    Subjective  Patient reports no pain today and states the last time he had pain was after prolonged sitting a couple of days  ago. He reports he felt fine following last treatment session. States HEP is going pretty good. States he feels like overall he is getting better.    Pertinent History  Patient is a 78 y.o. male who presents to outpatient physical therapy with a referral for medical diagnosis of radiculopathy of cervical region. This patient's chief complaints consist of intermittent R UE pain and paresthesia, onset 3-4 years ago and gradually worsening leading to the following functional deficits: difficulty resting for prolonged periods in a chair or staying in air conditioning. He reports he can do most of his usual activities, but is bothered by the pain and paresthesia at rest and is concerned about it getting worse and limiting his function for using his R UE, which is his dominant hand. Relevant past medical history and comorbidities include stent placement 6-7 years ago (followed by cardiologist recently, things are going well, has 50# lifting limit), diabetes (does not know AIC, cannot remember last check, was staying about the same), GERD, repaired hiatial hernia, HTN, sleep apnea, cholecystecomy, CAD, arthritis, appendectomy, hx of bilateral knee arthroplasty. Denies lung problems, epilepsy, or stroke.    Limitations  Other (comment);Sitting    How long can you sit comfortably?  Pain only when arms  raised and it takes 10-15 min to be uncomfortable in neck and shoulder when his arms are raised. Otherwise not limited.    Diagnostic tests  See note from 05/09/2019 in subjective exam for summary of cervical and R shoulder radiographs    Patient Stated Goals  get better and be able to get rid of this pain and tingling    Currently in Pain?  No/denies    Pain Onset  More than a month ago       TREATMENT:   Therapeutic exercise:to centralize symptoms and improve ROM, strength, muscular endurance, and activity tolerance required for successful completion of functional activities.Extensive tactile cues  throughout exercises to maintain proper form this session -Standing thoracic extension/BUE flexion and serratus anterior activation, lat stretch, withround rust-colored bolsterup wall, 5 second holds, cuing for technique. 2x10plus time for instruction, transitions, and appropriate rest. - seated repeated upper thoracic spine extension over edge of chair with clinician OP x 10 (no pain before or after) Hands behind head to support head. Cuing for proper form. - seated in chair cervical retraction with self overpressureto extensionx 10.(no pain at start or after)  Circuit 1: - scapular depression, seated in chair pressing forearms and elbows into chair arms to lift buttocks off chair ~ 1 inch. 3x10 to improve scapular depression muscles and decrease tension B UT.  - rows seated on edge of chair on dynadisc without back support with cuing for scapular depression, posterior tilt and depression. 3x10 with green theraband and extensive cuing to improve posture. Cuing to move elbows towards chair arms.  - bilateral shoulder ER seated on edge of chair on dynadisc without back support with cuing for scapular depression, posterior tilt and depression. Forearms supinated. Paper held by upper arm at sides to improve form and cuing to keep elbows close to chair arms. 3x10 with green theraband.  Continued exercise: - Education on HEP including handout  - standing B pec stretch in doorway. 3x30 seconds after extra time to teach technique and achieve acceptable form. Patient is very tight across anterior shoulders and had improved ability achieving correct form.3 -prone with head resting in blue cradle: scapular depression/retraction/posterior tilt toshoulder extension with backs of hands to the ceiling (anatomical position).To improve lower trap and retraction strength. Cuing for posture and technique.3X10 (manual therapy interspersed during rest breaks of prior exericse - see below)  Manual  therapy:to reduce pain and tissue tension, improve range of motion, neuromodulation, in order to promote improved ability to complete functional activities. - pronewith head in blue cradleCPA along thoracic spine through cervical spine. 2x10 reps each segment (pt reportsno discomfort today).Grades II-IV.  HOME EXERCISE PROGRAM: Seated Posture with Lumbar Roll Place the lumbar roll as shown in the picture on the left. Slide your bottom to the back of the chair and sit up straight so the roll provides support in the natural curve of your lower back. Keep your shoulders back and head in a neutral position. Maintain this position at all time when sitting. If you have no pain or tingling: - Complete 10 times every hour If you have pain or tingling - complete 10 at a time, checking with yourself after each 10 if your symptoms are better or worse. If they keep getting better, keep doing 10 at a time until the symptoms are gone or it is no longer getting better. If it stops getting better, can add a push with your fingers on your chin to get a better stretch. This works better  with back supported in a chair or against the wall. Seated Thoracic Extension If you have no pain or tingling: - Complete 10 times every hour If you have pain or tingling - complete 10 at a time, checking with yourself after each 10 if your symptoms are better or worse. If they keep getting better, keep doing 10 at a time until the symptoms are gone or it is no longer getting better  SEATED ROWS While seated on the edge of a chair, pull back on an elastic band in both arms as you bend your elbows. push chest forward Maintain erect posture the entire time. Repeat 10 Hold 1 Second Complete 3 Sets Perform 1 Times a Day YOUR HOME PROGRAM No Money with band Shoulder external rotation with scapular retraction Start with your elbows at your side and arms bent at 90 degree angle, palms up gripping the  band. Rotate your hands outward, and squeeze your shoulder blades down and together keeping your elbows at your sides while stretching the band. Repeat 10 Complete 3 Sets Perform 1 Times a Day   PT Education - 06/27/19 1129    Education Details  exercise purpose/form    Person(s) Educated  Patient    Methods  Explanation;Demonstration;Tactile cues;Verbal cues    Comprehension  Verbalized understanding;Returned demonstration;Verbal cues required;Tactile cues required       PT Short Term Goals - 06/08/19 1258      PT SHORT TERM GOAL #1   Title  Be independent with initial home exercise program for self-management of symptoms.    Baseline  Initial HEP provided at IE (05/09/2019); patient is doing his HEP but forgets to use it to reduce his pain/discomfort when it occurs. Requires further education and reinforcement (06/08/2019);    Time  2    Period  Weeks    Status  Partially Met    Target Date  05/23/19        PT Long Term Goals - 06/08/19 1259      PT LONG TERM GOAL #1   Title  Be independent with a long-term home exercise program for self-management of symptoms.    Baseline  Initial HEP provided at initial eval (05/09/2019); patient is doing his HEP but forgets to use it to reduce his pain/discomfort when it occurs. Requires further education and reinforcement (06/08/2019);    Time  4    Period  Weeks    Status  Partially Met    Target Date  07/06/19      PT LONG TERM GOAL #2   Title  Patient will be able to abolish symptoms at onset 4/5 times they occur in order to demonstrate strong self-management skills and independent management of current condition.    Baseline  Patient able to abolish in clinic (05/09/2019): patient able to abolish in clinic and reports abolishment or near abolishment when he remembers to do his exercises when he has pain at home (06/08/2019);    Time  4    Period  Weeks    Status  Partially Met    Target Date  07/06/19      PT LONG TERM GOAL #3    Title  Have at least 75% cervical spine AROM in all planes with no compensations or increase in pain in all planes except intermittent end range discomfort to allow patient to complete valued activities with less difficulty.    Baseline  see objective exam, most limited Extension: = 50 tight (minimal lower cervical movement; 630/2020);  minimal improvement in motion but discomfort is more symmetrical (06/08/2019);    Time  4    Period  Weeks    Status  Partially Met    Target Date  07/06/19      PT LONG TERM GOAL #4   Title  Complete community, work and/or recreational activities without limitation due to current condition    Baseline  Patient has difficulty sitting for long periods of time, being in air conditioned environment, and resting his R arm on an arm rest (05/09/2019). patient no longer has pain in air conditioned environement, he has not been thinking about how long he can sit, and he is able to avoid R arm paresthesia by altering his sitting posture, states he is about 30% better (06/08/2019);    Time  4    Period  Weeks    Status  Partially Met    Target Date  07/06/19      PT LONG TERM GOAL #5   Title  Reduce pain with functional activities to equal or less than 1/10 to allow patient to complete usual activities including ADLs, IADLs, and social engagement with less difficulty.    Baseline  5/10 (05/09/2019); today 1/10 but is intermittantly than thats.    Time  4    Period  Weeks    Status  New    Target Date  07/06/19            Plan - 06/27/19 2044    Clinical Impression Statement  Patient tolerated treatment well and continues to make good progress towards goals. Demonstrated improved ability to properly activate postural and scapular musculature, so his HEP was updated with scapular/postural exercises. Patient continues to be challenged in not overusing upper traps during arm motions and with maintaining good posture to prevent onset of pain. Pt required multimodal cuing  for proper technique and to facilitate improved neuromuscular control, strength, range of motion, and functional ability resulting in improved performance and form. Patient would benefit from continued physical therapy to address remaining impairments and functional limitations to work towards stated goals and return to PLOF or maximal functional independence    Personal Factors and Comorbidities  Age;Comorbidity 3+;Past/Current Experience;Education;Time since onset of injury/illness/exacerbation    Comorbidities  past medical history and comorbidities include stent placement 6-7 years ago (followed by cardiologist recently, things are going well, has 50# lifting limit), diabetes (does not know AIC, cannot remember last check, was staying about the same), GERD, repaired hiatial hernia, HTN, sleep apnea, cholecystecomy, CAD, arthritis, appendectomy, hx of bilateral knee arthroplasty. Denies lung problems, epilepsy, or stroke.    Examination-Activity Limitations  Sit;Other    Stability/Clinical Decision Making  Stable/Uncomplicated    Rehab Potential  Good    PT Frequency  2x / week    PT Duration  4 weeks    PT Treatment/Interventions  ADLs/Self Care Home Management;Cryotherapy;Moist Heat;Therapeutic activities;Therapeutic exercise;Neuromuscular re-education;Patient/family education;Manual techniques;Passive range of motion;Dry needling;Spinal Manipulations;Joint Manipulations;Other (comment)    PT Next Visit Plan  more exercises for upper thoracic mobility and postural strength/motor control    PT Home Exercise Plan  see note    Consulted and Agree with Plan of Care  Patient       Patient will benefit from skilled therapeutic intervention in order to improve the following deficits and impairments:  Decreased mobility, Increased muscle spasms, Impaired sensation, Cardiopulmonary status limiting activity, Decreased range of motion, Decreased activity tolerance, Pain, Decreased strength, Impaired  flexibility  Visit Diagnosis: 1. Radiculopathy,  cervical region   2. Cervicalgia   3. Paresthesia of skin        Problem List There are no active problems to display for this patient.   Everlean Alstrom. Graylon Good, PT, DPT 06/27/19, 8:45 PM  [facility] [address], [place], [address] Phone: [phone]   Fax:  [phone]  Name: VINSON TIETZE [name] MRN: [id number] Date of Birth: [birth year]

## 2019-06-29 ENCOUNTER — Ambulatory Visit: Payer: Medicare Other | Admitting: Physical Therapy

## 2019-06-29 ENCOUNTER — Encounter: Payer: Self-pay | Admitting: Physical Therapy

## 2019-06-29 ENCOUNTER — Other Ambulatory Visit: Payer: Self-pay

## 2019-06-29 DIAGNOSIS — M5412 Radiculopathy, cervical region: Secondary | ICD-10-CM

## 2019-06-29 DIAGNOSIS — R202 Paresthesia of skin: Secondary | ICD-10-CM

## 2019-06-29 DIAGNOSIS — M542 Cervicalgia: Secondary | ICD-10-CM

## 2019-06-29 NOTE — Therapy (Signed)
[facility] [address], [place], [address] Phone: [phone]   Fax:  [phone]  Physical Therapy Treatment  Patient Details  Name: Bobby TAGLIAFERRO [name] MRN: [id number] Date of Birth: 1941/09/01 Referring Provider (PT): Langston Reusing, MD   Encounter Date: 06/29/2019  PT End of Session - 06/29/19 1123    Visit Number  14    Number of Visits  18    Date for PT Re-Evaluation  07/06/19    Authorization Type  Medicare reporting period from 06/08/2019    Authorization Time Period  Current cert period: 06/09/6552 - 07/06/2019 (latest PN: 06/08/2019);    Authorization - Visit Number  4    Authorization - Number of Visits  10    PT Start Time  1120    PT Stop Time  1200    PT Time Calculation (min)  40 min    Activity Tolerance  Patient tolerated treatment well    Behavior During Therapy  WFL for tasks assessed/performed       Past Medical History:  Diagnosis Date  . Arthritis   . Back pain   . Coronary artery disease   . Diabetes mellitus without complication ([facility])   . GERD (gastroesophageal reflux disease)   . History of hiatal hernia   . Hyperlipidemia   . Hypertension   . Sleep apnea     Past Surgical History:  Procedure Laterality Date  . APPENDECTOMY    . CARDIAC CATHETERIZATION    . CHOLECYSTECTOMY    . COLONOSCOPY WITH ESOPHAGOGASTRODUODENOSCOPY (EGD)    . COLONOSCOPY WITH PROPOFOL N/A 03/16/2016   Procedure: COLONOSCOPY WITH PROPOFOL;  Surgeon: Lollie Sails, MD;  Location: [facility] ENDOSCOPY;  Service: Endoscopy;  Laterality: N/A;  . EYE SURGERY    . HERNIA REPAIR    . NISSEN FUNDOPLICATION    . testicular vein surgery    . TONSILLECTOMY    . TOTAL KNEE ARTHROPLASTY      There were no vitals filed for this visit.  Subjective Assessment - 06/29/19 1121    Subjective  Patient reports he is feeling "great" today. States he has not had pain since the afternoon following his last  treatment session when he was sitting in his chair. He reports he has been doing most of the exercises he performs in clinic at home with good results.    Pertinent History  Patient is a 78 y.o. male who presents to outpatient physical therapy with a referral for medical diagnosis of radiculopathy of cervical region. This patient's chief complaints consist of intermittent R UE pain and paresthesia, onset 3-4 years ago and gradually worsening leading to the following functional deficits: difficulty resting for prolonged periods in a chair or staying in air conditioning. He reports he can do most of his usual activities, but is bothered by the pain and paresthesia at rest and is concerned about it getting worse and limiting his function for using his R UE, which is his dominant hand. Relevant past medical history and comorbidities include stent placement 6-7 years ago (followed by cardiologist recently, things are going well, has 50# lifting limit), diabetes (does not know AIC, cannot remember last check, was staying about the same), GERD, repaired hiatial hernia, HTN, sleep apnea, cholecystecomy, CAD, arthritis, appendectomy, hx of bilateral knee arthroplasty. Denies lung problems, epilepsy, or stroke.    Limitations  Other (comment);Sitting    How long can you sit comfortably?  Pain only when  arms raised and it takes 10-15 min to be uncomfortable in neck and shoulder when his arms are raised. Otherwise not limited.    Diagnostic tests  See note from 05/09/2019 in subjective exam for summary of cervical and R shoulder radiographs    Patient Stated Goals  get better and be able to get rid of this pain and tingling    Currently in Pain?  No/denies    Pain Onset  More than a month ago         TREATMENT:   Therapeutic exercise:to centralize symptoms and improve ROM, strength, muscular endurance, and activity tolerance required for successful completion of functional activities.Extensive tactile cues  throughout exercises to maintain proper form this session -Standing thoracic extension/BUE flexion and serratus anterior activation, lat stretch, withround rust-colored bolsterup wall, 5 second holds, cuing for technique. x20plus time for instruction, transitions, and appropriate rest. - seated repeated upper thoracic spine extension over edge of chair with clinician OP x 10 (no pain before or after) Hands behind head to support head. Cuing for proper form. - seated in chair cervical retraction with self overpressureto extensionx 10.(no pain at start or after)  Circuit 1: - scapular depression, seated in chair pressing forearms and elbows into chair arms to lift buttocks off chair ~ 1 inch.3x10 to improve scapular depression muscles and decrease tension B UT.  - rows seated on edge of chair on dynadisc without back support with cuing for scapular depression, posterior tilt and depression.3x10 with blue theraband and extensive cuing to improve posture. Cuing to move elbows towards chair arms.  - bilateral shoulder ER seated on edge of chair on dynadisc without back support with cuing for scapular depression, posterior tilt and depression. Forearms supinated. Paper held by upper arm at sides to improve form and cuing to keep elbows close to chair arms.3x10 with blue theraband.  Continued exercise: - standing B pec stretch in doorway. 3x30 seconds. Patient able to reach position with minimal cuing now. L shoulder elevated compared to R.  - supine with no pillow and hands at sides with palms up (anatomical position). Isometric scapular depression/retraction/posterior tilt pressing into the mat to prepare for next circuit. X 5 correctly. Responded to cue to reach for heels with hands to improve scapular depression.   Circuit 2: - supine B shoulder abduction with scapular retraction, depression, and posterior tilt using red theraband. 2x10 - supine D2 shoulder flexion with scapular  depression, retraction, and upward rotation with red theraband. x10 each side.   HOME EXERCISE PROGRAM: Seated Posture with Lumbar Roll Place the lumbar roll as shown in the picture on the left. Slide your bottom to the back of the chair and sit up straight so the roll provides support in the natural curve of your lower back. Keep your shoulders back and head in a neutral position. Maintain this position at all time when sitting. If you have no pain or tingling: - Complete 10 times every hour If you have pain or tingling - complete 10 at a time, checking with yourself after each 10 if your symptoms are better or worse. If they keep getting better, keep doing 10 at a time until the symptoms are gone or it is no longer getting better. If it stops getting better, can add a push with your fingers on your chin to get a better stretch. This works better with back supported in a chair or against the wall. Seated Thoracic Extension If you have no pain or tingling: -  Complete 10 times every hour If you have pain or tingling - complete 10 at a time, checking with yourself after each 10 if your symptoms are better or worse. If they keep getting better, keep doing 10 at a time until the symptoms are gone or it is no longer getting better  SEATED ROWS While seated on the edge of a chair, pull back on an elastic band in both arms as you bend your elbows. push chest forward Maintain erect posture the entire time. Repeat 10 Hold 1 Second Complete 3 Sets Perform 1 Times a Day YOUR HOME PROGRAM No Money with band Shoulder external rotation with scapular retraction Start with your elbows at your side and arms bent at 90 degree angle, palms up gripping the band. Rotate your hands outward, and squeeze your shoulder blades down and together keeping your elbows at your sides while stretching the band. Repeat 10 Complete 3 Sets Perform 1 Times a Day    PT Education - 06/29/19 1130     Education Details  exercise purpose/form    Person(s) Educated  Patient    Methods  Explanation;Demonstration    Comprehension  Verbalized understanding;Returned demonstration       PT Short Term Goals - 06/08/19 1258      PT SHORT TERM GOAL #1   Title  Be independent with initial home exercise program for self-management of symptoms.    Baseline  Initial HEP provided at IE (05/09/2019); patient is doing his HEP but forgets to use it to reduce his pain/discomfort when it occurs. Requires further education and reinforcement (06/08/2019);    Time  2    Period  Weeks    Status  Partially Met    Target Date  05/23/19        PT Long Term Goals - 06/08/19 1259      PT LONG TERM GOAL #1   Title  Be independent with a long-term home exercise program for self-management of symptoms.    Baseline  Initial HEP provided at initial eval (05/09/2019); patient is doing his HEP but forgets to use it to reduce his pain/discomfort when it occurs. Requires further education and reinforcement (06/08/2019);    Time  4    Period  Weeks    Status  Partially Met    Target Date  07/06/19      PT LONG TERM GOAL #2   Title  Patient will be able to abolish symptoms at onset 4/5 times they occur in order to demonstrate strong self-management skills and independent management of current condition.    Baseline  Patient able to abolish in clinic (05/09/2019): patient able to abolish in clinic and reports abolishment or near abolishment when he remembers to do his exercises when he has pain at home (06/08/2019);    Time  4    Period  Weeks    Status  Partially Met    Target Date  07/06/19      PT LONG TERM GOAL #3   Title  Have at least 75% cervical spine AROM in all planes with no compensations or increase in pain in all planes except intermittent end range discomfort to allow patient to complete valued activities with less difficulty.    Baseline  see objective exam, most limited Extension: = 50 tight (minimal  lower cervical movement; 630/2020); minimal improvement in motion but discomfort is more symmetrical (06/08/2019);    Time  4    Period  Weeks    Status  Partially Met    Target Date  07/06/19      PT LONG TERM GOAL #4   Title  Complete community, work and/or recreational activities without limitation due to current condition    Baseline  Patient has difficulty sitting for long periods of time, being in air conditioned environment, and resting his R arm on an arm rest (05/09/2019). patient no longer has pain in air conditioned environement, he has not been thinking about how long he can sit, and he is able to avoid R arm paresthesia by altering his sitting posture, states he is about 30% better (06/08/2019);    Time  4    Period  Weeks    Status  Partially Met    Target Date  07/06/19      PT LONG TERM GOAL #5   Title  Reduce pain with functional activities to equal or less than 1/10 to allow patient to complete usual activities including ADLs, IADLs, and social engagement with less difficulty.    Baseline  5/10 (05/09/2019); today 1/10 but is intermittantly than thats.    Time  4    Period  Weeks    Status  New    Target Date  07/06/19            Plan - 06/30/19 1210    Clinical Impression Statement  Patient tolerated treatment well and demonstrates excellent improvement in ability to control scapulae during postural exercises including horizontal abduction, which he was unable to perform without significant overactivity of bilateral upper traps last time it was attempted. Patient also able to advance to D2 flexion pattern but completed just one set due to mild discomfort in the left shoulder and first time completing exercises. Recommend progression to two sets next session if pt feels good over the next day or so. Pt required multimodal cuing for proper technique and to facilitate improved neuromuscular control, strength, range of motion, and functional ability resulting in improved  performance and form. Patient would benefit from continued physical therapy to address remaining impairments and functional limitations to work towards stated goals and return to PLOF or maximal functional independence    Personal Factors and Comorbidities  Age;Comorbidity 3+;Past/Current Experience;Education;Time since onset of injury/illness/exacerbation    Comorbidities  past medical history and comorbidities include stent placement 6-7 years ago (followed by cardiologist recently, things are going well, has 50# lifting limit), diabetes (does not know AIC, cannot remember last check, was staying about the same), GERD, repaired hiatial hernia, HTN, sleep apnea, cholecystecomy, CAD, arthritis, appendectomy, hx of bilateral knee arthroplasty. Denies lung problems, epilepsy, or stroke.    Examination-Activity Limitations  Sit;Other    Stability/Clinical Decision Making  Stable/Uncomplicated    Rehab Potential  Good    PT Frequency  2x / week    PT Duration  4 weeks    PT Treatment/Interventions  ADLs/Self Care Home Management;Cryotherapy;Moist Heat;Therapeutic activities;Therapeutic exercise;Neuromuscular re-education;Patient/family education;Manual techniques;Passive range of motion;Dry needling;Spinal Manipulations;Joint Manipulations;Other (comment)    PT Next Visit Plan  more exercises for upper thoracic mobility and postural strength/motor control    PT Home Exercise Plan  see note    Consulted and Agree with Plan of Care  Patient       Patient will benefit from skilled therapeutic intervention in order to improve the following deficits and impairments:  Decreased mobility, Increased muscle spasms, Impaired sensation, Cardiopulmonary status limiting activity, Decreased range of motion, Decreased activity tolerance, Pain, Decreased strength, Impaired flexibility  Visit Diagnosis: Radiculopathy, cervical  region  Cervicalgia  Paresthesia of skin     Problem List There are no active  problems to display for this patient.   Everlean Alstrom. Graylon Good, PT, DPT 06/30/19, 12:10 PM  [facility] [address], [place], [address] Phone: [phone]   Fax:  [phone]  Name: Bobby PETT [name] MRN: [id number] Date of Birth: April 24, 1941

## 2019-07-03 ENCOUNTER — Ambulatory Visit: Payer: Medicare Other | Admitting: Physical Therapy

## 2019-07-04 ENCOUNTER — Encounter: Payer: Self-pay | Admitting: Physical Therapy

## 2019-07-04 ENCOUNTER — Ambulatory Visit: Payer: Medicare Other | Admitting: Physical Therapy

## 2019-07-04 ENCOUNTER — Other Ambulatory Visit: Payer: Self-pay

## 2019-07-04 DIAGNOSIS — M542 Cervicalgia: Secondary | ICD-10-CM

## 2019-07-04 DIAGNOSIS — M5412 Radiculopathy, cervical region: Secondary | ICD-10-CM | POA: Diagnosis not present

## 2019-07-04 DIAGNOSIS — R202 Paresthesia of skin: Secondary | ICD-10-CM

## 2019-07-04 NOTE — Therapy (Signed)
[facility] [address], [place], [address] Phone: [phone]   Fax:  [phone]  Physical Therapy Treatment  Patient Details  Name: Bobby MOLDOVAN [name] MRN: [id number] Date of Birth: [birth year] Referring Provider (PT): Langston Reusing, MD   Encounter Date: 07/04/2019  PT End of Session - 07/04/19 0951    Visit Number  15    Number of Visits  18    Date for PT Re-Evaluation  07/06/19    Authorization Type  Medicare reporting period from 06/08/2019    Authorization Time Period  Current cert period: 12/05/5168 - 07/06/2019 (latest PN: 06/08/2019);    Authorization - Visit Number  5    Authorization - Number of Visits  10    PT Start Time  [phone]    PT Stop Time  1030    PT Time Calculation (min)  42 min    Activity Tolerance  Patient tolerated treatment well    Behavior During Therapy  WFL for tasks assessed/performed       Past Medical History:  Diagnosis Date  . Arthritis   . Back pain   . Coronary artery disease   . Diabetes mellitus without complication ([place])   . GERD (gastroesophageal reflux disease)   . History of hiatal hernia   . Hyperlipidemia   . Hypertension   . Sleep apnea     Past Surgical History:  Procedure Laterality Date  . APPENDECTOMY    . CARDIAC CATHETERIZATION    . CHOLECYSTECTOMY    . COLONOSCOPY WITH ESOPHAGOGASTRODUODENOSCOPY (EGD)    . COLONOSCOPY WITH PROPOFOL N/A 03/16/2016   Procedure: COLONOSCOPY WITH PROPOFOL;  Surgeon: Lollie Sails, MD;  Location: [facility] ENDOSCOPY;  Service: Endoscopy;  Laterality: N/A;  . EYE SURGERY    . HERNIA REPAIR    . NISSEN FUNDOPLICATION    . testicular vein surgery    . TONSILLECTOMY    . TOTAL KNEE ARTHROPLASTY      There were no vitals filed for this visit.  Subjective Assessment - 07/04/19 0950    Subjective  Patient reports he is feeling good today. States the last time he had pain was at the eye doctor while sitting he  got some tingling in R arm breifly. States HEP is going well and he has no questions about it.    Pertinent History  Patient is a 78 y.o. male who presents to outpatient physical therapy with a referral for medical diagnosis of radiculopathy of cervical region. This patient's chief complaints consist of intermittent R UE pain and paresthesia, onset 3-4 years ago and gradually worsening leading to the following functional deficits: difficulty resting for prolonged periods in a chair or staying in air conditioning. He reports he can do most of his usual activities, but is bothered by the pain and paresthesia at rest and is concerned about it getting worse and limiting his function for using his R UE, which is his dominant hand. Relevant past medical history and comorbidities include stent placement 6-7 years ago (followed by cardiologist recently, things are going well, has 50# lifting limit), diabetes (does not know AIC, cannot remember last check, was staying about the same), GERD, repaired hiatial hernia, HTN, sleep apnea, cholecystecomy, CAD, arthritis, appendectomy, hx of bilateral knee arthroplasty. Denies lung problems, epilepsy, or stroke.    Limitations  Other (comment);Sitting    How long can you sit comfortably?  Pain only when arms raised and it takes 10-15  min to be uncomfortable in neck and shoulder when his arms are raised. Otherwise not limited.    Diagnostic tests  See note from 05/09/2019 in subjective exam for summary of cervical and R shoulder radiographs    Patient Stated Goals  get better and be able to get rid of this pain and tingling    Currently in Pain?  No/denies    Pain Onset  More than a month ago       TREATMENT:   Therapeutic exercise:to centralize symptoms and improve ROM, strength, muscular endurance, and activity tolerance required for successful completion of functional activities.Extensive tactile cues throughout exercises to maintain proper form this  session -Standing thoracic extension/BUE flexion and serratus anterior activation, lat stretch, withround rust-colored bolsterup wall, 5 second holds, cuing for technique.x20.  - seated repeated upper thoracic spine extension over edge of chair with clinician OP x 10 (no pain before or after) Hands behind head to support head. Cuing for proper form. - seated in chair cervical retraction with self overpressureto extensionx 10.(no pain at start or after)  Circuit 1: - scapular depression, seated in chair pressing forearms and elbows into chair arms to lift buttocks off chair ~ 1 inch.2x15 to improve scapular depression muscles and decrease tension B UT. - rows seated on edge of chair on dynadisc without back support with cuing for scapular depression, posterior tilt and depression.2x15 with black then blue theraband (black increased negative compensations so went back to blue second set) and cuing to improve posture. Cuing to move elbows towards chair arms.  - bilateral shoulder ER seated on edge of chair on dynadisc without back support with cuing for scapular depression, posterior tilt and depression. Forearms supinated. Paper held by upper arm at sides to improve form and cuing to keep elbows close to chair arms.2x15 withbluetheraband.Attempted black theraband but too strong for full ROM so discontinued.   Continued exercise: - standing B pec stretch in doorway. 3x30 seconds. Patient able to reach position with minimal cuing now. L shoulder elevated compared to R.  - lat pull down with isolated scapular depression/elevatoin. 2X 10 at 15# with quick improvement to learn coordinated movement.  - supine with no pillow and hands at sides with palms up (anatomical position). Isometric scapular depression/retraction/posterior tilt pressing into the mat to prepare for next circuit. 5 second hold X 10:  Circuit 2: - supine B shoulder abduction with scapular retraction, depression, and  posterior tilt using red theraband. 2x10 - supine D2 shoulder flexion with scapular depression, retraction, and upward rotation with red theraband. 2x10 each side.    HOME EXERCISE PROGRAM: Seated Posture with Lumbar Roll Place the lumbar roll as shown in the picture on the left. Slide your bottom to the back of the chair and sit up straight so the roll provides support in the natural curve of your lower back. Keep your shoulders back and head in a neutral position. Maintain this position at all time when sitting. If you have no pain or tingling: - Complete 10 times every hour If you have pain or tingling - complete 10 at a time, checking with yourself after each 10 if your symptoms are better or worse. If they keep getting better, keep doing 10 at a time until the symptoms are gone or it is no longer getting better. If it stops getting better, can add a push with your fingers on your chin to get a better stretch. This works better with back supported in a chair or  against the wall. Seated Thoracic Extension If you have no pain or tingling: - Complete 10 times every hour If you have pain or tingling - complete 10 at a time, checking with yourself after each 10 if your symptoms are better or worse. If they keep getting better, keep doing 10 at a time until the symptoms are gone or it is no longer getting better  SEATED ROWS While seated on the edge of a chair, pull back on an elastic band in both arms as you bend your elbows. push chest forward Maintain erect posture the entire time. Repeat 10 Hold 1 Second Complete 3 Sets Perform 1 Times a Day YOUR HOME PROGRAM No Money with band Shoulder external rotation with scapular retraction Start with your elbows at your side and arms bent at 90 degree angle, palms up gripping the band. Rotate your hands outward, and squeeze your shoulder blades down and together keeping your elbows at your sides while stretching  the band. Repeat 10 Complete 3 Sets Perform 1 Times a Day   PT Education - 07/04/19 0951    Education Details  exercise purpose/form    Person(s) Educated  Patient    Methods  Explanation;Demonstration    Comprehension  Verbalized understanding;Returned demonstration       PT Short Term Goals - 06/08/19 1258      PT SHORT TERM GOAL #1   Title  Be independent with initial home exercise program for self-management of symptoms.    Baseline  Initial HEP provided at IE (05/09/2019); patient is doing his HEP but forgets to use it to reduce his pain/discomfort when it occurs. Requires further education and reinforcement (06/08/2019);    Time  2    Period  Weeks    Status  Partially Met    Target Date  05/23/19        PT Long Term Goals - 06/08/19 1259      PT LONG TERM GOAL #1   Title  Be independent with a long-term home exercise program for self-management of symptoms.    Baseline  Initial HEP provided at initial eval (05/09/2019); patient is doing his HEP but forgets to use it to reduce his pain/discomfort when it occurs. Requires further education and reinforcement (06/08/2019);    Time  4    Period  Weeks    Status  Partially Met    Target Date  07/06/19      PT LONG TERM GOAL #2   Title  Patient will be able to abolish symptoms at onset 4/5 times they occur in order to demonstrate strong self-management skills and independent management of current condition.    Baseline  Patient able to abolish in clinic (05/09/2019): patient able to abolish in clinic and reports abolishment or near abolishment when he remembers to do his exercises when he has pain at home (06/08/2019);    Time  4    Period  Weeks    Status  Partially Met    Target Date  07/06/19      PT LONG TERM GOAL #3   Title  Have at least 75% cervical spine AROM in all planes with no compensations or increase in pain in all planes except intermittent end range discomfort to allow patient to complete valued activities with  less difficulty.    Baseline  see objective exam, most limited Extension: = 50 tight (minimal lower cervical movement; 630/2020); minimal improvement in motion but discomfort is more symmetrical (06/08/2019);  Time  4    Period  Weeks    Status  Partially Met    Target Date  07/06/19      PT LONG TERM GOAL #4   Title  Complete community, work and/or recreational activities without limitation due to current condition    Baseline  Patient has difficulty sitting for long periods of time, being in air conditioned environment, and resting his R arm on an arm rest (05/09/2019). patient no longer has pain in air conditioned environement, he has not been thinking about how long he can sit, and he is able to avoid R arm paresthesia by altering his sitting posture, states he is about 30% better (06/08/2019);    Time  4    Period  Weeks    Status  Partially Met    Target Date  07/06/19      PT LONG TERM GOAL #5   Title  Reduce pain with functional activities to equal or less than 1/10 to allow patient to complete usual activities including ADLs, IADLs, and social engagement with less difficulty.    Baseline  5/10 (05/09/2019); today 1/10 but is intermittantly than thats.    Time  4    Period  Weeks    Status  New    Target Date  07/06/19            Plan - 07/04/19 1022    Clinical Impression Statement  Patient tolerated treatment well and continues to make progress towards goals. Patient was able to progress to more reps, new exercises, and/or increased resistance on several exercises today. He continues to struggle with coordination of periscapular/postural exercises when resistance is too high, but overall shows good increased awareness of improved movement patterns. Pt required multimodal cuing for proper technique and to facilitate improved neuromuscular control, strength, range of motion, and functional ability resulting in improved performance and form. Patient would benefit from continued  physical therapy to address remaining impairments and functional limitations to work towards stated goals and return to PLOF or maximal functional independence    Personal Factors and Comorbidities  Age;Comorbidity 3+;Past/Current Experience;Education;Time since onset of injury/illness/exacerbation    Comorbidities  past medical history and comorbidities include stent placement 6-7 years ago (followed by cardiologist recently, things are going well, has 50# lifting limit), diabetes (does not know AIC, cannot remember last check, was staying about the same), GERD, repaired hiatial hernia, HTN, sleep apnea, cholecystecomy, CAD, arthritis, appendectomy, hx of bilateral knee arthroplasty. Denies lung problems, epilepsy, or stroke.    Examination-Activity Limitations  Sit;Other    Stability/Clinical Decision Making  Stable/Uncomplicated    Rehab Potential  Good    PT Frequency  2x / week    PT Duration  4 weeks    PT Treatment/Interventions  ADLs/Self Care Home Management;Cryotherapy;Moist Heat;Therapeutic activities;Therapeutic exercise;Neuromuscular re-education;Patient/family education;Manual techniques;Passive range of motion;Dry needling;Spinal Manipulations;Joint Manipulations;Other (comment)    PT Next Visit Plan  assess for readiness to discharge to independent HEP program next session    PT Home Exercise Plan  see note    Consulted and Agree with Plan of Care  Patient       Patient will benefit from skilled therapeutic intervention in order to improve the following deficits and impairments:  Decreased mobility, Increased muscle spasms, Impaired sensation, Cardiopulmonary status limiting activity, Decreased range of motion, Decreased activity tolerance, Pain, Decreased strength, Impaired flexibility  Visit Diagnosis: Radiculopathy, cervical region  Cervicalgia  Paresthesia of skin     Problem List There  are no active problems to display for this patient.  Everlean Alstrom. Graylon Good, PT,  DPT 07/04/19, 10:24 AM  [facility] [address] [place], [address] Phone: [phone]   Fax:  [phone]  Name: ALASDAIR KLEVE [name] MRN: [id number] Date of Birth: January 12, 1941

## 2019-07-05 ENCOUNTER — Encounter: Payer: Self-pay | Admitting: Physical Therapy

## 2019-07-05 ENCOUNTER — Ambulatory Visit: Payer: Medicare Other | Admitting: Physical Therapy

## 2019-07-05 ENCOUNTER — Other Ambulatory Visit: Payer: Self-pay

## 2019-07-05 ENCOUNTER — Telehealth: Payer: Self-pay | Admitting: Internal Medicine

## 2019-07-05 DIAGNOSIS — M5412 Radiculopathy, cervical region: Secondary | ICD-10-CM

## 2019-07-05 DIAGNOSIS — R202 Paresthesia of skin: Secondary | ICD-10-CM

## 2019-07-05 DIAGNOSIS — M542 Cervicalgia: Secondary | ICD-10-CM

## 2019-07-05 NOTE — Therapy (Signed)
[facility] [address] [place], [address] Phone: [phone]   Fax:  [phone]  Physical Therapy No-Visit Discharge Reporting period: 05/09/2019 - 07/05/2019  Patient Details  Name: Bobby Warner [name] MRN: [id number] Date of Birth: [birth year] Referring Provider (PT): Langston Reusing, MD   Encounter Date: 07/05/2019    Past Medical History:  Diagnosis Date  . Arthritis   . Back pain   . Coronary artery disease   . Diabetes mellitus without complication ([place])   . GERD (gastroesophageal reflux disease)   . History of hiatal hernia   . Hyperlipidemia   . Hypertension   . Sleep apnea     Past Surgical History:  Procedure Laterality Date  . APPENDECTOMY    . CARDIAC CATHETERIZATION    . CHOLECYSTECTOMY    . COLONOSCOPY WITH ESOPHAGOGASTRODUODENOSCOPY (EGD)    . COLONOSCOPY WITH PROPOFOL N/A 03/16/2016   Procedure: COLONOSCOPY WITH PROPOFOL;  Surgeon: Lollie Sails, MD;  Location: [facility] ENDOSCOPY;  Service: Endoscopy;  Laterality: N/A;  . EYE SURGERY    . HERNIA REPAIR    . NISSEN FUNDOPLICATION    . testicular vein surgery    . TONSILLECTOMY    . TOTAL KNEE ARTHROPLASTY      There were no vitals filed for this visit.  Subjective Assessment - 07/05/19 1731    Subjective  Patient's wife called and canceled his appointment due to expecting to be ready for discharge upon assessment at next visit. States he is satisfied with his episode of care and home exercise program and feels ready to discharge without further assessment.    Pertinent History  Patient is a 78 y.o. male who presents to outpatient physical therapy with a referral for medical diagnosis of radiculopathy of cervical region. This patient's chief complaints consist of intermittent R UE pain and paresthesia, onset 3-4 years ago and gradually worsening leading to the following functional deficits: difficulty resting for prolonged  periods in a chair or staying in air conditioning. He reports he can do most of his usual activities, but is bothered by the pain and paresthesia at rest and is concerned about it getting worse and limiting his function for using his R UE, which is his dominant hand. Relevant past medical history and comorbidities include stent placement 6-7 years ago (followed by cardiologist recently, things are going well, has 50# lifting limit), diabetes (does not know AIC, cannot remember last check, was staying about the same), GERD, repaired hiatial hernia, HTN, sleep apnea, cholecystecomy, CAD, arthritis, appendectomy, hx of bilateral knee arthroplasty. Denies lung problems, epilepsy, or stroke.    Limitations  Other (comment);Sitting    How long can you sit comfortably?  Pain only when arms raised and it takes 10-15 min to be uncomfortable in neck and shoulder when his arms are raised. Otherwise not limited.    Diagnostic tests  See note from 05/09/2019 in subjective exam for summary of cervical and R shoulder radiographs    Patient Stated Goals  get better and be able to get rid of this pain and tingling    Pain Onset  More than a month ago       OBJECTIVE Patient not present for measurement. Please see prior documentation for latest objective data.     PT Short Term Goals - 07/05/19 1735      PT SHORT TERM GOAL #1   Title  Be independent with initial home exercise program for self-management  of symptoms.    Baseline  Initial HEP provided at IE (05/09/2019); patient is doing his HEP but forgets to use it to reduce his pain/discomfort when it occurs. Requires further education and reinforcement (06/08/2019);    Time  2    Period  Weeks    Status  Achieved    Target Date  05/23/19        PT Long Term Goals - 07/05/19 1735      PT LONG TERM GOAL #1   Title  Be independent with a long-term home exercise program for self-management of symptoms.    Baseline  Initial HEP provided at initial eval  (05/09/2019); patient is doing his HEP but forgets to use it to reduce his pain/discomfort when it occurs. Requires further education and reinforcement (06/08/2019);    Time  4    Period  Weeks    Status  Achieved    Target Date  07/06/19      PT LONG TERM GOAL #2   Title  Patient will be able to abolish symptoms at onset 4/5 times they occur in order to demonstrate strong self-management skills and independent management of current condition.    Baseline  Patient able to abolish in clinic (05/09/2019): patient able to abolish in clinic and reports abolishment or near abolishment when he remembers to do his exercises when he has pain at home (06/08/2019); patient reports he can change position to abolish pain    Time  4    Period  Weeks    Status  Achieved    Target Date  07/06/19      PT LONG TERM GOAL #3   Title  Have at least 75% cervical spine AROM in all planes with no compensations or increase in pain in all planes except intermittent end range discomfort to allow patient to complete valued activities with less difficulty.    Baseline  see objective exam, most limited Extension: = 50 tight (minimal lower cervical movement; 630/2020); minimal improvement in motion but discomfort is more symmetrical (06/08/2019);    Time  4    Period  Weeks    Status  Partially Met    Target Date  07/06/19      PT LONG TERM GOAL #4   Title  Complete community, work and/or recreational activities without limitation due to current condition    Baseline  Patient has difficulty sitting for long periods of time, being in air conditioned environment, and resting his R arm on an arm rest (05/09/2019). patient no longer has pain in air conditioned environement, he has not been thinking about how long he can sit, and he is able to avoid R arm paresthesia by altering his sitting posture, states he is about 30% better (06/08/2019);    Time  4    Period  Weeks    Status  Achieved    Target Date  07/06/19      PT LONG  TERM GOAL #5   Title  Reduce pain with functional activities to equal or less than 1/10 to allow patient to complete usual activities including ADLs, IADLs, and social engagement with less difficulty.    Baseline  5/10 (05/09/2019); today 1/10 but is intermittantly than thats.    Time  4    Period  Weeks    Status  Partially Met    Target Date  07/06/19        Plan - 07/05/19 1750    Clinical Impression Statement  Patient  attended 15 phyiscal therapy sessions this episode of care and made steady progress towards goals, meeting or nearly meeting all goals. Patient reports he feels confident in continuing his HEP at home for independent management of remaining minor symptoms and is satisfied with his progress. Patient is now discharged due to improvement in condition.    Personal Factors and Comorbidities  Age;Comorbidity 3+;Past/Current Experience;Education;Time since onset of injury/illness/exacerbation    Comorbidities  past medical history and comorbidities include stent placement 6-7 years ago (followed by cardiologist recently, things are going well, has 50# lifting limit), diabetes (does not know AIC, cannot remember last check, was staying about the same), GERD, repaired hiatial hernia, HTN, sleep apnea, cholecystecomy, CAD, arthritis, appendectomy, hx of bilateral knee arthroplasty. Denies lung problems, epilepsy, or stroke.    Examination-Activity Limitations  Sit;Other    Stability/Clinical Decision Making  Stable/Uncomplicated    Rehab Potential  Good    PT Frequency  2x / week    PT Duration  4 weeks    PT Treatment/Interventions  ADLs/Self Care Home Management;Cryotherapy;Moist Heat;Therapeutic activities;Therapeutic exercise;Neuromuscular re-education;Patient/family education;Manual techniques;Passive range of motion;Dry needling;Spinal Manipulations;Joint Manipulations;Other (comment)    PT Next Visit Plan  Patient is now discharged from physical therapy due to satisfactory  improvement.    PT Home Exercise Plan  see note    Consulted and Agree with Plan of Care  Patient       Patient will benefit from skilled therapeutic intervention in order to improve the following deficits and impairments:  Decreased mobility, Increased muscle spasms, Impaired sensation, Cardiopulmonary status limiting activity, Decreased range of motion, Decreased activity tolerance, Pain, Decreased strength, Impaired flexibility  Visit Diagnosis: Radiculopathy, cervical region  Cervicalgia  Paresthesia of skin     Problem List There are no active problems to display for this patient.   Everlean Alstrom. Graylon Good, PT, DPT 07/05/19, 5:51 PM   [facility] [address] [place], [address] Phone: [phone]   Fax:  [phone]  Name: MARZELL ISAKSON [name] MRN: [id number] Date of Birth: 1941-03-22

## 2019-07-05 NOTE — Telephone Encounter (Signed)
Pt aware of date/time of covid test.  Nothing further is needed.  

## 2019-07-06 ENCOUNTER — Other Ambulatory Visit: Payer: Self-pay

## 2019-07-06 ENCOUNTER — Other Ambulatory Visit
Admission: RE | Admit: 2019-07-06 | Discharge: 2019-07-06 | Disposition: A | Discharge status: Home / Self Care | Payer: Medicare Other | Source: Ambulatory Visit | Attending: Pulmonary Disease | Admitting: Pulmonary Disease

## 2019-07-06 DIAGNOSIS — Z20828 Contact with and (suspected) exposure to other viral communicable diseases: Secondary | ICD-10-CM | POA: Insufficient documentation

## 2019-07-06 DIAGNOSIS — Z01812 Encounter for preprocedural laboratory examination: Secondary | ICD-10-CM | POA: Insufficient documentation

## 2019-07-06 LAB — SARS CORONAVIRUS 2 (TAT 6-24 HRS): SARS Coronavirus 2: NEGATIVE

## 2019-07-07 ENCOUNTER — Ambulatory Visit
Admission: RE | Admit: 2019-07-07 | Discharge: 2019-07-07 | Disposition: A | Discharge status: Home / Self Care | Payer: Medicare Other | Source: Ambulatory Visit | Attending: Gastroenterology | Admitting: Gastroenterology

## 2019-07-07 ENCOUNTER — Other Ambulatory Visit: Payer: Self-pay | Admitting: Gastroenterology

## 2019-07-07 DIAGNOSIS — K219 Gastro-esophageal reflux disease without esophagitis: Secondary | ICD-10-CM | POA: Insufficient documentation

## 2019-07-07 DIAGNOSIS — R1310 Dysphagia, unspecified: Secondary | ICD-10-CM | POA: Insufficient documentation

## 2019-07-07 DIAGNOSIS — Z7902 Long term (current) use of antithrombotics/antiplatelets: Secondary | ICD-10-CM | POA: Insufficient documentation

## 2019-07-10 ENCOUNTER — Other Ambulatory Visit (HOSPITAL_BASED_OUTPATIENT_CLINIC_OR_DEPARTMENT_OTHER): Payer: PRIVATE HEALTH INSURANCE | Admitting: Internal Medicine

## 2019-07-10 ENCOUNTER — Other Ambulatory Visit: Payer: Self-pay

## 2019-07-10 ENCOUNTER — Ambulatory Visit (HOSPITAL_BASED_OUTPATIENT_CLINIC_OR_DEPARTMENT_OTHER): Payer: Medicare Other | Attending: Pulmonary Disease | Admitting: Radiology

## 2019-07-10 DIAGNOSIS — Z789 Other specified health status: Secondary | ICD-10-CM

## 2019-07-10 DIAGNOSIS — G4733 Obstructive sleep apnea (adult) (pediatric): Secondary | ICD-10-CM

## 2019-07-11 ENCOUNTER — Encounter: Payer: PRIVATE HEALTH INSURANCE | Admitting: Physical Therapy

## 2019-07-13 ENCOUNTER — Encounter: Payer: PRIVATE HEALTH INSURANCE | Admitting: Physical Therapy

## 2019-07-18 ENCOUNTER — Encounter: Payer: PRIVATE HEALTH INSURANCE | Admitting: Physical Therapy

## 2019-07-20 ENCOUNTER — Encounter: Payer: PRIVATE HEALTH INSURANCE | Admitting: Physical Therapy

## 2019-07-25 ENCOUNTER — Telehealth: Payer: Self-pay | Admitting: Pulmonary Disease

## 2019-07-25 ENCOUNTER — Other Ambulatory Visit: Payer: PRIVATE HEALTH INSURANCE | Attending: Gastroenterology

## 2019-07-25 ENCOUNTER — Encounter: Payer: PRIVATE HEALTH INSURANCE | Admitting: Physical Therapy

## 2019-07-25 NOTE — Telephone Encounter (Signed)
Advised pt to call medical records at [phone] obtain copies of his results.No further actions necessary at this time.

## 2019-07-27 ENCOUNTER — Encounter: Payer: PRIVATE HEALTH INSURANCE | Admitting: Physical Therapy

## 2019-08-15 ENCOUNTER — Ambulatory Visit (INDEPENDENT_AMBULATORY_CARE_PROVIDER_SITE_OTHER): Payer: Medicare Other | Admitting: Vascular Surgery

## 2019-08-15 ENCOUNTER — Encounter (INDEPENDENT_AMBULATORY_CARE_PROVIDER_SITE_OTHER): Payer: Self-pay | Admitting: Vascular Surgery

## 2019-08-15 ENCOUNTER — Other Ambulatory Visit: Payer: Self-pay

## 2019-08-15 ENCOUNTER — Other Ambulatory Visit (INDEPENDENT_AMBULATORY_CARE_PROVIDER_SITE_OTHER): Payer: Self-pay | Admitting: Vascular Surgery

## 2019-08-15 VITALS — BP 154/72 | HR 63 | Resp 12 | Ht 66.0 in | Wt 182.0 lb

## 2019-08-15 DIAGNOSIS — I7025 Atherosclerosis of native arteries of other extremities with ulceration: Secondary | ICD-10-CM | POA: Diagnosis not present

## 2019-08-15 DIAGNOSIS — L97929 Non-pressure chronic ulcer of unspecified part of left lower leg with unspecified severity: Secondary | ICD-10-CM

## 2019-08-15 DIAGNOSIS — N183 Chronic kidney disease, stage 3 unspecified: Secondary | ICD-10-CM | POA: Diagnosis not present

## 2019-08-15 DIAGNOSIS — I10 Essential (primary) hypertension: Secondary | ICD-10-CM

## 2019-08-15 DIAGNOSIS — L89899 Pressure ulcer of other site, unspecified stage: Secondary | ICD-10-CM

## 2019-08-15 DIAGNOSIS — E1122 Type 2 diabetes mellitus with diabetic chronic kidney disease: Secondary | ICD-10-CM

## 2019-08-15 NOTE — Progress Notes (Signed)
Patient ID: Bobby HarpsWilliam L Friedland [name], male   DOB: January 16, 1941, 78 y.o.   MRN: [id number]  Chief Complaint  Patient presents with   New Patient (Initial Visit)    HPI Bobby HarpsWilliam L Tarrant [name] is a 78 y.o. male.  I am asked to see the patient by Dr. Adrian Prince. Cline for evaluation of a nonhealing ulceration on the left first metatarsal area in a long-term diabetic with multiple other medical issues.  This has been present now for about 2 months.  It is quite painful.  Local wound care has not improved it.  There has been question as to whether or not this may be gout.  He reports no trauma, injury, or inciting event that started the wound.  Local wound care has failed to get healed up.  He apparently was checked for vascular disease about 6 or 7 years ago and his flow was okay at that time, and has not had any vascular surgery or intervention to his knowledge.  He has no fevers or chills.  No chest pain or shortness of breath.     Past Medical History:  Diagnosis Date   Arthritis    Back pain    Coronary artery disease    Diabetes mellitus without complication (HCC)    GERD (gastroesophageal reflux disease)    History of hiatal hernia    Hyperlipidemia    Hypertension    Sleep apnea     Past Surgical History:  Procedure Laterality Date   APPENDECTOMY     CARDIAC CATHETERIZATION     CHOLECYSTECTOMY     COLONOSCOPY WITH ESOPHAGOGASTRODUODENOSCOPY (EGD)     COLONOSCOPY WITH PROPOFOL N/A 03/16/2016   Procedure: COLONOSCOPY WITH PROPOFOL;  Surgeon: Christena DeemMartin U Skulskie, MD;  [facility];  Service: Endoscopy;  Laterality: N/A;   EYE SURGERY     HERNIA REPAIR     NISSEN FUNDOPLICATION     testicular vein surgery     TONSILLECTOMY     TOTAL KNEE ARTHROPLASTY      Family History No bleeding disorders, clotting disorders, autoimmune diseases, or aneurysms  Social History Social History   Tobacco Use   Smoking status: Former Smoker   Smokeless tobacco: Never Used    Substance Use Topics   Alcohol use: Yes    Alcohol/week: 1.0 standard drinks    Types: 1 Cans of beer per week   Drug use: No  Married and wife accompanies today  Allergies  Allergen Reactions   Nsaids    Tolmetin     Current Outpatient Medications  Medication Sig Dispense Refill   acetaminophen (TYLENOL) 500 MG tablet Take 1,000 mg by mouth every 6 (six) hours as needed.     aspirin 81 MG tablet Take 81 mg by mouth daily.     atorvastatin (LIPITOR) 40 MG tablet Take 40 mg by mouth daily.     b complex vitamins capsule Take 1 capsule by mouth daily.     cetirizine (ZYRTEC) 10 MG chewable tablet Chew 10 mg by mouth daily.     clopidogrel (PLAVIX) 75 MG tablet Take 75 mg by mouth daily.     colchicine 0.6 MG tablet Take 0.6 mg by mouth 2 (two) times daily as needed.     glimepiride (AMARYL) 2 MG tablet TAKE 1 TABLET BY MOUTH ONCE DAILY WITH BREAKFAST     JARDIANCE 10 MG TABS tablet TAKE 1 TABLET BY MOUTH ONCE DAILY WITH BREAKFAST     ketorolac (ACULAR) 0.4 %  SOLN 1 drop 4 (four) times daily.     losartan (COZAAR) 100 MG tablet Take 100 mg by mouth daily.     methocarbamol (ROBAXIN) 500 MG tablet Take 500 mg by mouth daily.      metoprolol succinate (TOPROL-XL) 100 MG 24 hr tablet Take 100 mg by mouth daily. Take with or immediately following a meal.     omeprazole (PRILOSEC) 20 MG capsule Take 20 mg by mouth daily.     tiZANidine (ZANAFLEX) 4 MG tablet Take 4 mg by mouth 3 (three) times daily as needed.     vitamin B-12 (CYANOCOBALAMIN) 1000 MCG tablet Take 1,000 mcg by mouth daily.     No current facility-administered medications for this visit.       REVIEW OF SYSTEMS (Negative unless checked)  Constitutional: [] Weight loss  [] Fever  [] Chills Cardiac: [] Chest pain   [] Chest pressure   [] Palpitations   [] Shortness of breath when laying flat   [] Shortness of breath at rest   [] Shortness of breath with exertion. Vascular:  [] Pain in legs with walking    [] Pain in legs at rest   [] Pain in legs when laying flat   [] Claudication   [x] Pain in feet when walking  [x] Pain in feet at rest  [] Pain in feet when laying flat   [] History of DVT   [] Phlebitis   [] Swelling in legs   [] Varicose veins   [x] Non-healing ulcers Pulmonary:   [] Uses home oxygen   [] Productive cough   [] Hemoptysis   [] Wheeze  [] COPD   [] Asthma Neurologic:  [] Dizziness  [] Blackouts   [] Seizures   [] History of stroke   [] History of TIA  [] Aphasia   [] Temporary blindness   [] Dysphagia   [] Weakness or numbness in arms   [] Weakness or numbness in legs Musculoskeletal:  [x] Arthritis   [] Joint swelling   [x] Joint pain   [x] Low back pain Hematologic:  [] Easy bruising  [] Easy bleeding   [] Hypercoagulable state   [] Anemic  [] Hepatitis Gastrointestinal:  [] Blood in stool   [] Vomiting blood  [x] Gastroesophageal reflux/heartburn   [] Abdominal pain Genitourinary:  [] Chronic kidney disease   [] Difficult urination  [] Frequent urination  [] Burning with urination   [] Hematuria Skin:  [] Rashes   [x] Ulcers   [x] Wounds Psychological:  [] History of anxiety   []  History of major depression.    Physical Exam BP (!) 154/72 (BP Location: Left Arm, Patient Position: Sitting, Cuff Size: Normal)    Pulse 63    Resp 12    Ht 5\' 6"  (1.676 m)    Wt 182 lb (82.6 kg)    BMI 29.38 kg/m  Gen:  WD/WN, NAD.  Appears younger than stated age Head: [place]/AT, No temporalis wasting. Ear/Nose/Throat: Hearing grossly intact, nares w/o erythema or drainage, oropharynx w/o Erythema/Exudate Eyes: Conjunctiva clear, sclera non-icteric  Neck: trachea midline.  No JVD.  Pulmonary:  Good air movement, respirations not labored, no use of accessory muscles  Cardiac: RRR, no JVD Vascular:  Vessel Right Left  Radial Palpable Palpable                          DP  1+  trace  PT  1+  trace   Gastrointestinal:. No masses, surgical incisions, or scars. Musculoskeletal: M/S 5/5 throughout.  Small, less than 1 cm circular wound on the  medial aspect of the left foot at the first metatarsal head.  No surrounding erythema no deformity or atrophy.  No edema. Neurologic: Sensation grossly intact in extremities.  Symmetrical.  Speech is fluent. Motor exam as listed above. Psychiatric: Judgment intact, Mood & affect appropriate for pt's clinical situation. Dermatologic: Small wound on the left first metatarsal head as above    Radiology No results found.  Labs Recent Results (from the past 2160 hour(s))  SARS CORONAVIRUS 2 (TAT 6-12 HRS) Nasal Swab Aptima Multi Swab     Status: None   Collection Time: 07/06/19  1:13 PM   Specimen: Aptima Multi Swab; Nasal Swab  Result Value Ref Range   SARS Coronavirus 2 NEGATIVE NEGATIVE    Comment: (NOTE) SARS-CoV-2 target nucleic acids are NOT DETECTED. The SARS-CoV-2 RNA is generally detectable in upper and lower respiratory specimens during the acute phase of infection. Negative results do not preclude SARS-CoV-2 infection, do not rule out co-infections with other pathogens, and should not be used as the sole basis for treatment or other patient management decisions. Negative results must be combined with clinical observations, patient history, and epidemiological information. The expected result is Negative. Fact Sheet for Patients: HairSlick.no Fact Sheet for Healthcare Providers: quierodirigir.com This test is not yet approved or cleared by the [place] FDA and  has been authorized for detection and/or diagnosis of SARS-CoV-2 by FDA under an Emergency Use Authorization (EUA). This EUA will remain  in effect (meaning this test can be used) for the duration of the COVID-19 declaration under Section 56 4(b)(1) of the Act, 21 U.S.C. section 360bbb-3(b)(1), unless the authorization is terminated or revoked sooner. Performed at [facility], [address] [place], [place] [address]     Assessment/Plan:  HTN  (hypertension), benign blood pressure control important in reducing the progression of atherosclerotic disease. On appropriate oral medications.   Diabetes mellitus with stage 3 chronic kidney disease (HCC) blood glucose control important in reducing the progression of atherosclerotic disease. Also, involved in wound healing. On appropriate medications.   Renal failure, chronic, stage 3 (moderate) In discussions with the patient and his family, particularly given his renal insufficiency, we will start with noninvasive studies to avoid contrast unless necessary.  I do think it is likely to be necessary as I have a high clinical index of suspicion for peripheral arterial disease but noninvasive studies are certainly reasonable.  Atherosclerosis of native arteries of the extremities with ulceration (HCC) Recommend: We discussed the pathophysiology and natural history of peripheral arterial disease today.  Clinically, I have high concern for peripheral arterial disease but in a diabetic with chronic kidney disease we can start with noninvasive studies before moving to an angiogram.  I did discuss an angiogram and I think this will ultimately be required. Patient should undergo arterial duplex of the lower extremity ASAP because there has been a significant deterioration in the patient's lower extremity symptoms with a nonhealing wound.  The patient states they are having increased pain.  The risks and benefits as well as the alternatives were discussed in detail with the patient.  All questions were answered.  Patient agrees to proceed and understands this could be a prelude to angiography and intervention.  The patient will follow up with me in the office to review the studies.       [name] 08/15/2019, 9:30 AM   This note was created with Dragon medical transcription system.  Any errors from dictation are unintentional.

## 2019-08-15 NOTE — Assessment & Plan Note (Signed)
blood glucose control important in reducing the progression of atherosclerotic disease. Also, involved in wound healing. On appropriate medications.  

## 2019-08-15 NOTE — Patient Instructions (Signed)
Peripheral Vascular Disease  Peripheral vascular disease (PVD) is a disease of the blood vessels that are not part of your heart and brain. A simple term for PVD is poor circulation. In most cases, PVD narrows the blood vessels that carry blood from your heart to the rest of your body. This can reduce the supply of blood to your arms, legs, and internal organs, like your stomach or kidneys. However, PVD most often affects a person's lower legs and feet. Without treatment, PVD tends to get worse. PVD can also lead to acute ischemic limb. This is when an arm or leg suddenly cannot get enough blood. This is a medical emergency. Follow these instructions at home: Lifestyle  Do not use any products that contain nicotine or tobacco, such as cigarettes and e-cigarettes. If you need help quitting, ask your doctor.  Lose weight if you are overweight. Or, stay at a healthy weight as told by your doctor.  Eat a diet that is low in fat and cholesterol. If you need help, ask your doctor.  Exercise regularly. Ask your doctor for activities that are right for you. General instructions  Take over-the-counter and prescription medicines only as told by your doctor.  Take good care of your feet: ? Wear comfortable shoes that fit well. ? Check your feet often for any cuts or sores.  Keep all follow-up visits as told by your doctor This is important. Contact a doctor if:  You have cramps in your legs when you walk.  You have leg pain when you are at rest.  You have coldness in a leg or foot.  Your skin changes.  You are unable to get or have an erection (erectile dysfunction).  You have cuts or sores on your feet that do not heal. Get help right away if:  Your arm or leg turns cold, numb, and blue.  Your arms or legs become red, warm, swollen, painful, or numb.  You have chest pain.  You have trouble breathing.  You suddenly have weakness in your face, arm, or leg.  You become very  confused or you cannot speak.  You suddenly have a very bad headache.  You suddenly cannot see. Summary  Peripheral vascular disease (PVD) is a disease of the blood vessels.  A simple term for PVD is poor circulation. Without treatment, PVD tends to get worse.  Treatment may include exercise, low fat and low cholesterol diet, and quitting smoking. This information is not intended to replace advice given to you by your health care provider. Make sure you discuss any questions you have with your health care provider. Document Released: 01/20/2010 Document Revised: 10/08/2017 Document Reviewed: 12/03/2016 Elsevier Patient Education  2020 Elsevier Inc.  

## 2019-08-15 NOTE — Assessment & Plan Note (Signed)
blood pressure control important in reducing the progression of atherosclerotic disease. On appropriate oral medications.  

## 2019-08-15 NOTE — Assessment & Plan Note (Signed)
Recommend: We discussed the pathophysiology and natural history of peripheral arterial disease today.  Clinically, I have high concern for peripheral arterial disease but in a diabetic with chronic kidney disease we can start with noninvasive studies before moving to an angiogram.  I did discuss an angiogram and I think this will ultimately be required. Patient should undergo arterial duplex of the lower extremity ASAP because there has been a significant deterioration in the patient's lower extremity symptoms with a nonhealing wound.  The patient states they are having increased pain.  The risks and benefits as well as the alternatives were discussed in detail with the patient.  All questions were answered.  Patient agrees to proceed and understands this could be a prelude to angiography and intervention.  The patient will follow up with me in the office to review the studies.

## 2019-08-15 NOTE — Assessment & Plan Note (Signed)
In discussions with the patient and his family, particularly given his renal insufficiency, we will start with noninvasive studies to avoid contrast unless necessary.  I do think it is likely to be necessary as I have a high clinical index of suspicion for peripheral arterial disease but noninvasive studies are certainly reasonable.

## 2019-08-16 ENCOUNTER — Other Ambulatory Visit: Payer: Self-pay

## 2019-08-16 ENCOUNTER — Ambulatory Visit (INDEPENDENT_AMBULATORY_CARE_PROVIDER_SITE_OTHER): Payer: Medicare Other

## 2019-08-16 ENCOUNTER — Ambulatory Visit (INDEPENDENT_AMBULATORY_CARE_PROVIDER_SITE_OTHER): Payer: Medicare Other | Admitting: Nurse Practitioner

## 2019-08-16 ENCOUNTER — Encounter (INDEPENDENT_AMBULATORY_CARE_PROVIDER_SITE_OTHER): Payer: Self-pay | Admitting: Nurse Practitioner

## 2019-08-16 VITALS — BP 151/78 | HR 58 | Resp 10 | Ht 70.0 in | Wt 182.0 lb

## 2019-08-16 DIAGNOSIS — I10 Essential (primary) hypertension: Secondary | ICD-10-CM | POA: Diagnosis not present

## 2019-08-16 DIAGNOSIS — K219 Gastro-esophageal reflux disease without esophagitis: Secondary | ICD-10-CM

## 2019-08-16 DIAGNOSIS — L97929 Non-pressure chronic ulcer of unspecified part of left lower leg with unspecified severity: Secondary | ICD-10-CM | POA: Diagnosis not present

## 2019-08-16 DIAGNOSIS — L89899 Pressure ulcer of other site, unspecified stage: Secondary | ICD-10-CM | POA: Diagnosis not present

## 2019-08-16 DIAGNOSIS — N183 Chronic kidney disease, stage 3 unspecified: Secondary | ICD-10-CM

## 2019-08-16 DIAGNOSIS — I7025 Atherosclerosis of native arteries of other extremities with ulceration: Secondary | ICD-10-CM

## 2019-08-16 DIAGNOSIS — E1122 Type 2 diabetes mellitus with diabetic chronic kidney disease: Secondary | ICD-10-CM

## 2019-08-16 NOTE — Progress Notes (Signed)
SUBJECTIVE:  Patient ID: Bobby Cirri., male    DOB: 04-13-1941, 78 y.o.   MRN: [id number] Chief Complaint  Patient presents with  . Follow-up    HPI  Bobby Vanderschaaf. is a 78 y.o. male that is referred by Dr. Cleda Mccreedy his podiatrist due to nonhealing ulceration with multiple other medical issues.  Is been present for about 2 months and its painful.  He has been getting local wound care for about 2 months now with no improvement.  He does not report any trauma, injury or inciting factor for the wound.  He underwent vascular study as ago and it did not reveal any disease at that time.  The patient is a former smoker just quitting under a year ago.  He denies any fever, chills, nausea, vomiting or diarrhea.  He denies any chest pain or shortness of breath.  Today noninvasive studies show right ABI is noncompressible with a TBI 0.67.  The left ABI is also noncompressible with a TBI of 0.45.  The patient underwent a bilateral lower extremity arterial duplex study which reveals biphasic waveforms on the right lower extremity down to the level of the tibial arteries where the anterior tibial and peroneal arteries are monophasic with a biphasic posterior tibial artery.  The left lower extremity has biphasic waveforms down to the level of the tibial arteries with an occluded anterior tibial artery, monophasic posterior tibial artery and biphasic peroneal artery.  Past Medical History:  Diagnosis Date  . Arthritis   . Back pain   . Coronary artery disease   . Diabetes mellitus without complication ([place])   . GERD (gastroesophageal reflux disease)   . History of hiatal hernia   . Hyperlipidemia   . Hypertension   . Sleep apnea     Past Surgical History:  Procedure Laterality Date  . APPENDECTOMY    . CARDIAC CATHETERIZATION    . CHOLECYSTECTOMY    . COLONOSCOPY WITH ESOPHAGOGASTRODUODENOSCOPY (EGD)    . COLONOSCOPY WITH PROPOFOL N/A 03/16/2016   Procedure: COLONOSCOPY WITH PROPOFOL;   Surgeon: Lollie Sails, MD;  Location: [facility] ENDOSCOPY;  Service: Endoscopy;  Laterality: N/A;  . EYE SURGERY    . HERNIA REPAIR    . NISSEN FUNDOPLICATION    . testicular vein surgery    . TONSILLECTOMY    . TOTAL KNEE ARTHROPLASTY      Social History   Socioeconomic History  . Marital status: Married    Spouse name: Not on file  . Number of children: Not on file  . Years of education: Not on file  . Highest education level: Not on file  Occupational History  . Not on file  Social Needs  . Financial resource strain: Not on file  . Food insecurity    Worry: Not on file    Inability: Not on file  . Transportation needs    Medical: Not on file    Non-medical: Not on file  Tobacco Use  . Smoking status: Former Research scientist (life sciences)  . Smokeless tobacco: Never Used  Substance and Sexual Activity  . Alcohol use: Yes    Alcohol/week: 1.0 standard drinks    Types: 1 Cans of beer per week  . Drug use: No  . Sexual activity: Not on file  Lifestyle  . Physical activity    Days per week: Not on file    Minutes per session: Not on file  . Stress: Not on file  Relationships  . Social connections  Talks on phone: Not on file    Gets together: Not on file    Attends religious service: Not on file    Active member of club or organization: Not on file    Attends meetings of clubs or organizations: Not on file    Relationship status: Not on file  . Intimate partner violence    Fear of current or ex partner: Not on file    Emotionally abused: Not on file    Physically abused: Not on file    Forced sexual activity: Not on file  Other Topics Concern  . Not on file  Social History Narrative  . Not on file    History reviewed. No pertinent family history.  Allergies  Allergen Reactions  . Nsaids   . Tolmetin      Review of Systems   Review of Systems: Negative Unless Checked Constitutional: [] Weight loss  [] Fever  [] Chills Cardiac: [] Chest pain   []  Atrial Fibrillation   [] Palpitations   [] Shortness of breath when laying flat   [] Shortness of breath with exertion. [] Shortness of breath at rest Vascular:  [x] Pain in legs with walking   [] Pain in legs with standing [] Pain in legs when laying flat   [] Claudication    [] Pain in feet when laying flat    [] History of DVT   [] Phlebitis   [] Swelling in legs   [] Varicose veins   [x] Non-healing ulcers Pulmonary:   [] Uses home oxygen   [] Productive cough   [] Hemoptysis   [] Wheeze  [] COPD   [] Asthma Neurologic:  [] Dizziness   [] Seizures  [] Blackouts [] History of stroke   [] History of TIA  [] Aphasia   [] Temporary Blindness   [] Weakness or numbness in arm   [] Weakness or numbness in leg Musculoskeletal:   [] Joint swelling   [x] Joint pain   [x] Low back pain  []  History of Knee Replacement [x] Arthritis [] back Surgeries  []  Spinal Stenosis    Hematologic:  [] Easy bruising  [] Easy bleeding   [] Hypercoagulable state   [] Anemic Gastrointestinal:  [] Diarrhea   [] Vomiting  [x] Gastroesophageal reflux/heartburn   [] Difficulty swallowing. [] Abdominal pain Genitourinary:  [] Chronic kidney disease   [] Difficult urination  [] Anuric   [] Blood in urine [] Frequent urination  [] Burning with urination   [] Hematuria Skin:  [] Rashes   [x] Ulcers [x] Wounds Psychological:  [] History of anxiety   []  History of major depression  []  Memory Difficulties      OBJECTIVE:   Physical Exam  BP (!) 151/78 (BP Location: Left Arm, Patient Position: Sitting, Cuff Size: Normal)   Pulse (!) 58   Resp 10   Ht 5\' 10"  (1.778 m)   Wt 182 lb (82.6 kg)   BMI 26.11 kg/m   Gen: WD/WN, NAD Head: [place]/AT, No temporalis wasting.  Ear/Nose/Throat: Hearing grossly intact, nares w/o erythema or drainage Eyes: PER, EOMI, sclera nonicteric.  Neck: Supple, no masses.  No JVD.  Pulmonary:  Good air movement, no use of accessory muscles.  Cardiac: RRR Vascular:  Vessel Right Left  Radial Palpable Palpable  Dorsalis Pedis Palpable Trace Palpable  Posterior Tibial Palpable  Trace Palpable   Gastrointestinal: soft, non-distended. No guarding/no peritoneal signs.  Musculoskeletal: M/S 5/5 throughout.  small dime sized wound on left foot medial side of first metatarsal head  Neurologic: Pain and light touch intact in extremities.  Symmetrical.  Speech is fluent. Motor exam as listed above. Psychiatric: Judgment intact, Mood & affect appropriate for pt's clinical situation. Dermatologic: No Venous rashes. No changes consistent with cellulitis. Lymph : No  Cervical lymphadenopathy, no lichenification or skin changes of chronic lymphedema.       ASSESSMENT AND PLAN:  1. Atherosclerosis of native arteries of the extremities with ulceration (HCC)  Recommend:  The patient has evidence of severe atherosclerotic changes of both lower extremities associated with ulceration and tissue loss of the foot.  This represents a limb threatening ischemia and places the patient at the risk for limb loss.  Patient should undergo angiography of the lower extremities with the hope for intervention for limb salvage.  The risks and benefits as well as the alternative therapies was discussed in detail with the patient.  All questions were answered.    The patient would like to discuss angio with wife and will contact our office to schedule.  Risk of ulceration and   2. HTN (hypertension), benign Continue antihypertensive medications as already ordered, these medications have been reviewed and there are no changes at this time.   3. Diabetes mellitus with stage 3 chronic kidney disease (HCC) Continue hypoglycemic medications as already ordered, these medications have been reviewed and there are no changes at this time.  Hgb A1C to be monitored as already arranged by primary service   4. Gastroesophageal reflux disease, unspecified whether esophagitis present Continue PPI as already ordered, this medication has been reviewed and there are no changes at this time.  Avoidence of  caffeine and alcohol  Moderate elevation of the head of the bed    Current Outpatient Medications on File Prior to Visit  Medication Sig Dispense Refill  . acetaminophen (TYLENOL) 500 MG tablet Take 1,000 mg by mouth every 6 (six) hours as needed.    Marland Kitchen. aspirin 81 MG tablet Take 81 mg by mouth daily.    Marland Kitchen. atorvastatin (LIPITOR) 40 MG tablet Take 40 mg by mouth daily.    Marland Kitchen. b complex vitamins capsule Take 1 capsule by mouth daily.    . cetirizine (ZYRTEC) 10 MG chewable tablet Chew 10 mg by mouth daily.    . clopidogrel (PLAVIX) 75 MG tablet Take 75 mg by mouth daily.    . colchicine 0.6 MG tablet Take 0.6 mg by mouth 2 (two) times daily as needed.    Marland Kitchen. glimepiride (AMARYL) 2 MG tablet TAKE 1 TABLET BY MOUTH ONCE DAILY WITH BREAKFAST    . JARDIANCE 10 MG TABS tablet TAKE 1 TABLET BY MOUTH ONCE DAILY WITH BREAKFAST    . ketorolac (ACULAR) 0.4 % SOLN 1 drop 4 (four) times daily.    Marland Kitchen. losartan (COZAAR) 100 MG tablet Take 100 mg by mouth daily.    . methocarbamol (ROBAXIN) 500 MG tablet Take 500 mg by mouth daily.     . metoprolol succinate (TOPROL-XL) 100 MG 24 hr tablet Take 100 mg by mouth daily. Take with or immediately following a meal.    . omeprazole (PRILOSEC) 20 MG capsule Take 20 mg by mouth daily.    Marland Kitchen. tiZANidine (ZANAFLEX) 4 MG tablet Take 4 mg by mouth 3 (three) times daily as needed.    . vitamin B-12 (CYANOCOBALAMIN) 1000 MCG tablet Take 1,000 mcg by mouth daily.     No current facility-administered medications on file prior to visit.     There are no Patient Instructions on file for this visit. No follow-ups on file.   Georgiana SpinnerFallon E Maicol Bowland, NP  This note was completed with Office managerDragon Dictation.  Any errors are purely unintentional.

## 2019-08-30 ENCOUNTER — Telehealth (INDEPENDENT_AMBULATORY_CARE_PROVIDER_SITE_OTHER): Payer: Self-pay

## 2019-08-30 NOTE — Telephone Encounter (Signed)
I spoke with the patient's wife on 08/29/2019 to get the patient scheduled for leg angio with Dr. Lucky Cowboy. Patient has a blockage per the wife. Patient was offered to be scheduled on 09/04/2019, per the wife she wanted the patient scheduled after 09/29/2019. The patient is now scheduled for angio on 10/09/2019 with a 6:45 am arrival time to the [facility]. Patient will do his Covid testing on 10/06/2019 between 12:30-2:30 pm at the [facility]. Pre-procedure instructions will be mailed to the patient as requested.

## 2019-09-05 ENCOUNTER — Other Ambulatory Visit: Admission: RE | Admit: 2019-09-05 | Payer: PRIVATE HEALTH INSURANCE | Source: Ambulatory Visit

## 2019-09-20 ENCOUNTER — Telehealth (INDEPENDENT_AMBULATORY_CARE_PROVIDER_SITE_OTHER): Payer: Self-pay

## 2019-09-20 NOTE — Telephone Encounter (Signed)
Patient wife has been aware with medical advice

## 2019-09-20 NOTE — Telephone Encounter (Signed)
Since after his angio he will need to be blood thinners, it would be wise to do it after his EGD.  April, can you call they patient to let him know that we will be calling to reschedule his angio.  Mickel Baas, can you get it rescheduled and contact the patient once it Is done. Thanks.

## 2019-09-21 NOTE — Telephone Encounter (Signed)
Called and spoke with the patient's wife and she stated that after talking with his doctor about the EGD they wanted him to continue with this angio instead. So the patient has been moved back to his original appt on 10/09/2019 with Dr. Lucky Cowboy.

## 2019-09-27 ENCOUNTER — Encounter (INDEPENDENT_AMBULATORY_CARE_PROVIDER_SITE_OTHER): Payer: Self-pay

## 2019-09-27 NOTE — Telephone Encounter (Signed)
PATIENT HAS HAD A ARRIVAL CHANGE FROM 6:45 AM TO 9:30 AM ARRIVAL TO THE [facility] ON 10/09/2019.

## 2019-10-03 ENCOUNTER — Telehealth: Payer: Self-pay | Admitting: Pulmonary Disease

## 2019-10-03 NOTE — Telephone Encounter (Signed)
Former DS pt- requesting another referral to mask fitting clinic. Mask that he was fitted for previously is not comfortable.   DK please advise if okay to order.

## 2019-10-03 NOTE — Telephone Encounter (Signed)
Please make referral

## 2019-10-04 ENCOUNTER — Other Ambulatory Visit: Payer: Self-pay

## 2019-10-04 DIAGNOSIS — G4733 Obstructive sleep apnea (adult) (pediatric): Secondary | ICD-10-CM

## 2019-10-04 NOTE — Telephone Encounter (Signed)
Referral has been placed for mask fitting.  Pt's spouse, Theda(dpr) is aware. Nothing further is needed.

## 2019-10-06 ENCOUNTER — Other Ambulatory Visit
Admission: RE | Admit: 2019-10-06 | Discharge: 2019-10-06 | Disposition: A | Discharge status: Home / Self Care | Payer: Medicare Other | Source: Ambulatory Visit | Attending: Vascular Surgery | Admitting: Vascular Surgery

## 2019-10-06 DIAGNOSIS — Z20828 Contact with and (suspected) exposure to other viral communicable diseases: Secondary | ICD-10-CM | POA: Diagnosis not present

## 2019-10-06 DIAGNOSIS — Z01812 Encounter for preprocedural laboratory examination: Secondary | ICD-10-CM | POA: Insufficient documentation

## 2019-10-07 LAB — SARS CORONAVIRUS 2 (TAT 6-24 HRS): SARS Coronavirus 2: NEGATIVE

## 2019-10-08 ENCOUNTER — Other Ambulatory Visit (INDEPENDENT_AMBULATORY_CARE_PROVIDER_SITE_OTHER): Payer: Self-pay | Admitting: Nurse Practitioner

## 2019-10-09 ENCOUNTER — Encounter: Payer: Self-pay | Admitting: *Deleted

## 2019-10-09 ENCOUNTER — Encounter
Admission: RE | Disposition: A | Discharge status: Home / Self Care | Payer: Self-pay | Source: Home / Self Care | Attending: Vascular Surgery

## 2019-10-09 ENCOUNTER — Ambulatory Visit
Admission: RE | Admit: 2019-10-09 | Discharge: 2019-10-09 | Disposition: A | Discharge status: Home / Self Care | Payer: Medicare Other | Attending: Vascular Surgery | Admitting: Vascular Surgery

## 2019-10-09 DIAGNOSIS — L97529 Non-pressure chronic ulcer of other part of left foot with unspecified severity: Secondary | ICD-10-CM | POA: Insufficient documentation

## 2019-10-09 DIAGNOSIS — M1990 Unspecified osteoarthritis, unspecified site: Secondary | ICD-10-CM | POA: Diagnosis not present

## 2019-10-09 DIAGNOSIS — K219 Gastro-esophageal reflux disease without esophagitis: Secondary | ICD-10-CM | POA: Insufficient documentation

## 2019-10-09 DIAGNOSIS — I70299 Other atherosclerosis of native arteries of extremities, unspecified extremity: Secondary | ICD-10-CM

## 2019-10-09 DIAGNOSIS — N183 Chronic kidney disease, stage 3 unspecified: Secondary | ICD-10-CM | POA: Diagnosis not present

## 2019-10-09 DIAGNOSIS — E1151 Type 2 diabetes mellitus with diabetic peripheral angiopathy without gangrene: Secondary | ICD-10-CM | POA: Diagnosis not present

## 2019-10-09 DIAGNOSIS — Z886 Allergy status to analgesic agent status: Secondary | ICD-10-CM | POA: Diagnosis not present

## 2019-10-09 DIAGNOSIS — L97909 Non-pressure chronic ulcer of unspecified part of unspecified lower leg with unspecified severity: Secondary | ICD-10-CM

## 2019-10-09 DIAGNOSIS — E11621 Type 2 diabetes mellitus with foot ulcer: Secondary | ICD-10-CM | POA: Insufficient documentation

## 2019-10-09 DIAGNOSIS — I70245 Atherosclerosis of native arteries of left leg with ulceration of other part of foot: Secondary | ICD-10-CM | POA: Diagnosis not present

## 2019-10-09 DIAGNOSIS — I129 Hypertensive chronic kidney disease with stage 1 through stage 4 chronic kidney disease, or unspecified chronic kidney disease: Secondary | ICD-10-CM | POA: Insufficient documentation

## 2019-10-09 DIAGNOSIS — E785 Hyperlipidemia, unspecified: Secondary | ICD-10-CM | POA: Insufficient documentation

## 2019-10-09 DIAGNOSIS — E1122 Type 2 diabetes mellitus with diabetic chronic kidney disease: Secondary | ICD-10-CM | POA: Insufficient documentation

## 2019-10-09 DIAGNOSIS — Z87891 Personal history of nicotine dependence: Secondary | ICD-10-CM | POA: Insufficient documentation

## 2019-10-09 DIAGNOSIS — I2510 Atherosclerotic heart disease of native coronary artery without angina pectoris: Secondary | ICD-10-CM | POA: Insufficient documentation

## 2019-10-09 DIAGNOSIS — G4730 Sleep apnea, unspecified: Secondary | ICD-10-CM | POA: Insufficient documentation

## 2019-10-09 HISTORY — PX: LOWER EXTREMITY ANGIOGRAPHY: CATH118251

## 2019-10-09 LAB — GLUCOSE, CAPILLARY
Glucose-Capillary: 132 mg/dL — ABNORMAL HIGH (ref 70–99)
Glucose-Capillary: 104 mg/dL — ABNORMAL HIGH (ref 70–99)

## 2019-10-09 LAB — CREATININE, SERUM
Creatinine, Ser: 1.38 mg/dL — ABNORMAL HIGH (ref 0.61–1.24)
GFR calc Af Amer: 56 mL/min — ABNORMAL LOW (ref >60)
GFR calc non Af Amer: 48 mL/min — ABNORMAL LOW (ref >60)

## 2019-10-09 LAB — BUN: BUN: 17 mg/dL (ref 8–23)

## 2019-10-09 SURGERY — LOWER EXTREMITY ANGIOGRAPHY
Anesthesia: Moderate Sedation | Laterality: Left

## 2019-10-09 MED ORDER — HYDROMORPHONE HCL 1 MG/ML IJ SOLN: 1.0000 mg | Freq: Once | INTRAMUSCULAR | Status: DC | PRN

## 2019-10-09 MED ORDER — ONDANSETRON HCL 4 MG/2ML IJ SOLN: 4.0000 mg | Freq: Four times a day (QID) | INTRAMUSCULAR | Status: DC | PRN

## 2019-10-09 MED ORDER — CEFAZOLIN SODIUM-DEXTROSE 2-4 GM/100ML-% IV SOLN
INTRAVENOUS | Status: AC
  Administered 2019-10-09: 2 g via INTRAVENOUS
  Filled 2019-10-09: qty 100

## 2019-10-09 MED ORDER — SODIUM CHLORIDE 0.9 % IV SOLN: 250.0000 mL | INTRAVENOUS | Status: DC | PRN

## 2019-10-09 MED ORDER — METHYLPREDNISOLONE SODIUM SUCC 125 MG IJ SOLR: 125.0000 mg | Freq: Once | INTRAMUSCULAR | Status: DC | PRN

## 2019-10-09 MED ORDER — MIDAZOLAM HCL 5 MG/5ML IJ SOLN
INTRAMUSCULAR | Status: AC
  Filled 2019-10-09: qty 5

## 2019-10-09 MED ORDER — FENTANYL CITRATE (PF) 100 MCG/2ML IJ SOLN
INTRAMUSCULAR | Status: DC | PRN
  Administered 2019-10-09: 25 ug via INTRAVENOUS
  Administered 2019-10-09: 50 ug via INTRAVENOUS
  Administered 2019-10-09: 25 ug via INTRAVENOUS

## 2019-10-09 MED ORDER — SODIUM CHLORIDE 0.9 % IV BOLUS
INTRAVENOUS | Status: AC | PRN
  Administered 2019-10-09: 250 mL via INTRAVENOUS

## 2019-10-09 MED ORDER — MIDAZOLAM HCL 2 MG/2ML IJ SOLN
INTRAMUSCULAR | Status: DC | PRN
  Administered 2019-10-09: 1 mg via INTRAVENOUS
  Administered 2019-10-09: 2 mg via INTRAVENOUS
  Administered 2019-10-09: 1 mg via INTRAVENOUS

## 2019-10-09 MED ORDER — SODIUM CHLORIDE 0.9% FLUSH: 3.0000 mL | INTRAVENOUS | Status: DC | PRN

## 2019-10-09 MED ORDER — SODIUM CHLORIDE 0.9% FLUSH: 3.0000 mL | Freq: Two times a day (BID) | INTRAVENOUS | Status: DC

## 2019-10-09 MED ORDER — MIDAZOLAM HCL 2 MG/ML PO SYRP: 8.0000 mg | ORAL_SOLUTION | Freq: Once | ORAL | Status: DC | PRN

## 2019-10-09 MED ORDER — HEPARIN SODIUM (PORCINE) 1000 UNIT/ML IJ SOLN
INTRAMUSCULAR | Status: AC
  Filled 2019-10-09: qty 1

## 2019-10-09 MED ORDER — CEFAZOLIN SODIUM-DEXTROSE 2-4 GM/100ML-% IV SOLN
2.0000 g | Freq: Once | INTRAVENOUS | Status: AC
  Administered 2019-10-09: 11:00:00 2 g via INTRAVENOUS

## 2019-10-09 MED ORDER — SODIUM CHLORIDE 0.9 % IV SOLN
INTRAVENOUS | Status: DC
  Administered 2019-10-09: 10:00:00 via INTRAVENOUS

## 2019-10-09 MED ORDER — IODIXANOL 320 MG/ML IV SOLN
INTRAVENOUS | Status: DC | PRN
  Administered 2019-10-09: 130 mL via INTRA_ARTERIAL

## 2019-10-09 MED ORDER — HYDRALAZINE HCL 20 MG/ML IJ SOLN: 5.0000 mg | INTRAMUSCULAR | Status: DC | PRN

## 2019-10-09 MED ORDER — DIPHENHYDRAMINE HCL 50 MG/ML IJ SOLN: 50.0000 mg | Freq: Once | INTRAMUSCULAR | Status: DC | PRN

## 2019-10-09 MED ORDER — FENTANYL CITRATE (PF) 100 MCG/2ML IJ SOLN
INTRAMUSCULAR | Status: AC
  Filled 2019-10-09: qty 2

## 2019-10-09 MED ORDER — SODIUM CHLORIDE 0.9 % IV SOLN: INTRAVENOUS | Status: DC

## 2019-10-09 MED ORDER — LABETALOL HCL 5 MG/ML IV SOLN: 10.0000 mg | INTRAVENOUS | Status: DC | PRN

## 2019-10-09 MED ORDER — HEPARIN SODIUM (PORCINE) 1000 UNIT/ML IJ SOLN
INTRAMUSCULAR | Status: DC | PRN
  Administered 2019-10-09: 5000 [IU] via INTRAVENOUS

## 2019-10-09 MED ORDER — FAMOTIDINE 20 MG PO TABS: 40.0000 mg | ORAL_TABLET | Freq: Once | ORAL | Status: DC | PRN

## 2019-10-09 MED ORDER — ACETAMINOPHEN 325 MG PO TABS: 650.0000 mg | ORAL_TABLET | ORAL | Status: DC | PRN

## 2019-10-09 SURGICAL SUPPLY — 24 items
BALLN LUTONIX 018 5X150X130 (BALLOONS) ×3
BALLN ULTRVRSE 2.5X300X150 (BALLOONS) ×3
BALLN ULTRVRSE 2X300X150 (BALLOONS) ×2
BALLN ULTRVRSE 2X300X150 OTW (BALLOONS) ×1
BALLOON LUTONIX 018 5X150X130 (BALLOONS) ×1 IMPLANT
BALLOON ULTRVRSE 2.5X300X150 (BALLOONS) ×1 IMPLANT
BALLOON ULTRVRSE 2X300X150 OTW (BALLOONS) ×1 IMPLANT
CATH BEACON 5 .038 100 VERT TP (CATHETERS) ×3 IMPLANT
CATH CXI SUPP ANG 4FR 135 (CATHETERS) ×1 IMPLANT
CATH CXI SUPP ANG 4FR 135CM (CATHETERS) ×3
CATH PIG 70CM (CATHETERS) ×3 IMPLANT
CATH SEEKER .018X150 (CATHETERS) ×3 IMPLANT
DEVICE PRESTO INFLATION (MISCELLANEOUS) ×3 IMPLANT
DEVICE STARCLOSE SE CLOSURE (Vascular Products) ×3 IMPLANT
GLIDEWIRE ADV .035X260CM (WIRE) ×3 IMPLANT
GUIDEWIRE PFTE-COATED .018X300 (WIRE) ×3 IMPLANT
PACK ANGIOGRAPHY (CUSTOM PROCEDURE TRAY) ×3 IMPLANT
SHEATH ANL2 6FRX45 HC (SHEATH) ×3 IMPLANT
SHEATH BRITE TIP 5FRX11 (SHEATH) ×3 IMPLANT
STENT VIABAHN 6X150X120 (Permanent Stent) ×3 IMPLANT
SYR MEDRAD MARK 7 150ML (SYRINGE) ×3 IMPLANT
TUBING CONTRAST HIGH PRESS 72 (TUBING) ×3 IMPLANT
WIRE G V18X300CM (WIRE) ×3 IMPLANT
WIRE J 3MM .035X145CM (WIRE) ×3 IMPLANT

## 2019-10-09 NOTE — H&P (Signed)
[facility] Admission History & Physical  MRN : [id number]  Bobby HarpsWilliam L Ryden [name] is a 78 y.o. (26-Nov-1940) male who presents with chief complaint of No chief complaint on file. Marland Kitchen.  History of Present Illness: Patient presents today for his left lower extremity angiogram to treat arterial insufficiency.  He was initially seen last month and delayed his procedure to undergo endoscopy as he would need to be on anticoagulants more than likely after the procedure.  He continues to have a persistent ulceration on the left foot.  No fevers or chills.  Some pain in the left foot particular around the ulcer but nothing dramatically different than when he was in the office last month.  Current Facility-Administered Medications  Medication Dose Route Frequency Provider Last Rate Last Dose  . 0.9 %  sodium chloride infusion   Intravenous Continuous Georgiana SpinnerBrown, Fallon E, NP 75 mL/hr at 10/09/19 1003    . ceFAZolin (ANCEF) IVPB 2g/100 mL premix  2 g Intravenous Once Sheppard PlumberBrown, Fallon E, NP 200 mL/hr at 10/09/19 1045 2 g at 10/09/19 1045  . diphenhydrAMINE (BENADRYL) injection 50 mg  50 mg Intravenous Once PRN Georgiana SpinnerBrown, Fallon E, NP      . famotidine (PEPCID) tablet 40 mg  40 mg Oral Once PRN Georgiana SpinnerBrown, Fallon E, NP      . fentaNYL (SUBLIMAZE) 100 MCG/2ML injection           . fentaNYL (SUBLIMAZE) injection    PRN Annice Needyew,  S, MD   50 mcg at 10/09/19 1047  . heparin 1000 UNIT/ML injection           . HYDROmorphone (DILAUDID) injection 1 mg  1 mg Intravenous Once PRN Sheppard PlumberBrown, Fallon E, NP      . methylPREDNISolone sodium succinate (SOLU-MEDROL) 125 mg/2 mL injection 125 mg  125 mg Intravenous Once PRN Sheppard PlumberBrown, Fallon E, NP      . midazolam (VERSED) 2 MG/ML syrup 8 mg  8 mg Oral Once PRN Georgiana SpinnerBrown, Fallon E, NP      . midazolam (VERSED) 5 MG/5ML injection           . midazolam (VERSED) injection    PRN Annice Needyew,  S, MD   2 mg at 10/09/19 1047  . ondansetron (ZOFRAN) injection 4 mg  4 mg Intravenous Q6H PRN  Georgiana SpinnerBrown, Fallon E, NP        Past Medical History:  Diagnosis Date  . Arthritis   . Back pain   . Coronary artery disease   . Diabetes mellitus without complication (HCC)   . GERD (gastroesophageal reflux disease)   . History of hiatal hernia   . Hyperlipidemia   . Hypertension   . Sleep apnea     Past Surgical History:  Procedure Laterality Date  . APPENDECTOMY    . CARDIAC CATHETERIZATION    . CHOLECYSTECTOMY    . COLONOSCOPY WITH ESOPHAGOGASTRODUODENOSCOPY (EGD)    . COLONOSCOPY WITH PROPOFOL N/A 03/16/2016   Procedure: COLONOSCOPY WITH PROPOFOL;  Surgeon: Christena DeemMartin U Skulskie, MD;  [facility];  Service: Endoscopy;  Laterality: N/A;  . EYE SURGERY    . HERNIA REPAIR    . NISSEN FUNDOPLICATION    . testicular vein surgery    . TONSILLECTOMY    . TOTAL KNEE ARTHROPLASTY       Social History   Tobacco Use  . Smoking status: Former Games developermoker  . Smokeless tobacco: Never Used  Substance Use Topics  . Alcohol use: Yes  Alcohol/week: 1.0 standard drinks    Types: 1 Cans of beer per week  . Drug use: No    Family History No bleeding disorders, clotting disorders, autoimmune diseases, or aneurysms  Allergies  Allergen Reactions  . Nsaids   . Tolmetin      REVIEW OF SYSTEMS (Negative unless checked)  Constitutional: [] ?Weight loss  [] ?Fever  [] ?Chills Cardiac: [] ?Chest pain   [] ?Chest pressure   [] ?Palpitations   [] ?Shortness of breath when laying flat   [] ?Shortness of breath at rest   [] ?Shortness of breath with exertion. Vascular:  [] ?Pain in legs with walking   [] ?Pain in legs at rest   [] ?Pain in legs when laying flat   [] ?Claudication   [x] ?Pain in feet when walking  [x] ?Pain in feet at rest  [] ?Pain in feet when laying flat   [] ?History of DVT   [] ?Phlebitis   [] ?Swelling in legs   [] ?Varicose veins   [x] ?Non-healing ulcers Pulmonary:   [] ?Uses home oxygen   [] ?Productive cough   [] ?Hemoptysis   [] ?Wheeze  [] ?COPD   [] ?Asthma Neurologic:   [] ?Dizziness  [] ?Blackouts   [] ?Seizures   [] ?History of stroke   [] ?History of TIA  [] ?Aphasia   [] ?Temporary blindness   [] ?Dysphagia   [] ?Weakness or numbness in arms   [] ?Weakness or numbness in legs Musculoskeletal:  [x] ?Arthritis   [] ?Joint swelling   [x] ?Joint pain   [x] ?Low back pain Hematologic:  [] ?Easy bruising  [] ?Easy bleeding   [] ?Hypercoagulable state   [] ?Anemic  [] ?Hepatitis Gastrointestinal:  [] ?Blood in stool   [] ?Vomiting blood  [x] ?Gastroesophageal reflux/heartburn   [] ?Abdominal pain Genitourinary:  [] ?Chronic kidney disease   [] ?Difficult urination  [] ?Frequent urination  [] ?Burning with urination   [] ?Hematuria Skin:  [] ?Rashes   [x] ?Ulcers   [x] ?Wounds Psychological:  [] ?History of anxiety   [] ? History of major depression.  Physical Examination  Vitals:   10/09/19 0934 10/09/19 1045 10/09/19 1050 10/09/19 1055  BP: 139/73 131/64 109/61 116/64  Pulse: 95 60 (!) 58 (!) 57  Resp: 15 17 15 15   Temp: 98.3 F (36.8 C)     TempSrc: Oral     SpO2: 99% 100% 98% 100%  Weight: 83 kg     Height: 5\' 10"  (1.778 m)      Body mass index is 26.26 kg/m. Gen: WD/WN, NAD Head: [place]/AT, No temporalis wasting. Ear/Nose/Throat: Hearing grossly intact, nares w/o erythema or drainage, oropharynx w/o Erythema/Exudate,  Eyes: Conjunctiva clear, sclera non-icteric Neck: Trachea midline.  No JVD.  Pulmonary:  Good air movement, respirations not labored, no use of accessory muscles.  Cardiac: RRR, normal S1, S2. Vascular:  Vessel Right Left  Radial Palpable Palpable                          PT  1+ palpable  trace palpable  DP  1+ palpable  trace palpable   Gastrointestinal: soft, non-tender/non-distended. No guarding/reflex.  Musculoskeletal: M/S 5/5 throughout.  Less than 1 cm circular wound to the medial aspect of the left first metatarsal with no surrounding erythema.  No deformity or atrophy.  No edema Neurologic: Sensation grossly intact in extremities.  Symmetrical.   Speech is fluent. Motor exam as listed above. Psychiatric: Judgment intact, Mood & affect appropriate for pt's clinical situation. Dermatologic: Small chronic wound on the left first metatarsal head     CBC Lab Results  Component Value Date   WBC 4.2 04/18/2013   HGB 13.4 04/18/2013   HCT 38.5 (L) 04/18/2013  MCV 100 04/18/2013   PLT 162 04/18/2013    BMET    Component Value Date/Time   NA 141 04/18/2013 0907   K 3.7 04/18/2013 0907   CL 105 04/18/2013 0907   CO2 30 04/18/2013 0907   GLUCOSE 159 (H) 04/18/2013 0907   BUN 17 10/09/2019 0939   BUN 8 04/18/2013 0907   CREATININE 1.38 (H) 10/09/2019 0939   CREATININE 1.08 04/18/2013 0907   CALCIUM 8.6 04/18/2013 0907   GFRNONAA 48 (L) 10/09/2019 0939   GFRNONAA >60 04/18/2013 0907   GFRAA 56 (L) 10/09/2019 0939   GFRAA >60 04/18/2013 0907   Estimated Creatinine Clearance: 44.8 mL/min (A) (by C-G formula based on SCr of 1.38 mg/dL (H)).  COAG Lab Results  Component Value Date   INR 1.0 04/18/2013    Radiology No results found.    Assessment/Plan HTN (hypertension), benign blood pressure control important in reducing the progression of atherosclerotic disease. On appropriate oral medications.   Diabetes mellitus with stage 3 chronic kidney disease (HCC) blood glucose control important in reducing the progression of atherosclerotic disease. Also, involved in wound healing. On appropriate medications.  CKD stage III.  GFR was 48.  Extra hydration with procedure today.  PAD with ulceration left lower extremity.  Noninvasive studies showed significant arterial occlusive disease in the left lower extremity particular in the tibial vessels.  Angiogram is being performed today.  We will limit contrast as much as possible with his renal insufficiency and this remains a critical and limb threatening situation.   Leotis Pain, MD  10/09/2019 11:00 AM

## 2019-10-09 NOTE — Op Note (Signed)
[facility]  Percutaneous Study/Intervention Procedural Note   Date of Surgery: 10/09/2019  Surgeon(s):Sherri Mcarthy    Assistants:none  Pre-operative Diagnosis: PAD with ulceration left lower extremity  Post-operative diagnosis:  Same  Procedure(s) Performed:             1.  Ultrasound guidance for vascular access right femoral artery             2.  Catheter placement into left anterior tibial and left posterior tibial arteries from right femoral approach             3.  Aortogram and selective left lower extremity angiogram             4.  Percutaneous transluminal angioplasty of left proximal anterior tibial artery with 2.5 mm diameter angioplasty balloon             5.   Percutaneous transluminal angioplasty of the left popliteal artery with 5 mm diameter by 15 cm length Lutonix drug-coated angioplasty balloon  6.  Percutaneous transluminal angioplasty of the left posterior tibial artery with 2 mm diameter angioplasty balloon  7.  Viabahn stent placement to the left popliteal artery with a 6 mm diameter by 15 cm length stent for 2 areas of greater than 50% residual stenosis after angioplasty             8.  StarClose closure device right femoral artery  EBL: 5 cc  Contrast: 130 cc  Fluoro Time: 22.2 minutes  Moderate Conscious Sedation Time: approximately 50 minutes using 4 mg of Versed and 100 mcg of Fentanyl              Indications:  Patient is a 78 y.o.male with a longstanding nonhealing ulceration of the left foot. The patient has noninvasive study showing significant reduction in blood flow to the left foot. The patient is brought in for angiography for further evaluation and potential treatment.  Due to the limb threatening nature of the situation, angiogram was performed for attempted limb salvage. The patient is aware that if the procedure fails, amputation would be expected.  The patient also understands that even with successful revascularization,  amputation may still be required due to the severity of the situation.  Risks and benefits are discussed and informed consent is obtained.   Procedure:  The patient was identified and appropriate procedural time out was performed.  The patient was then placed supine on the table and prepped and draped in the usual sterile fashion. Moderate conscious sedation was administered during a face to face encounter with the patient throughout the procedure with my supervision of the RN administering medicines and monitoring the patient's vital signs, pulse oximetry, telemetry and mental status throughout from the start of the procedure until the patient was taken to the recovery room. Ultrasound was used to evaluate the right common femoral artery.  It was patent .  A digital ultrasound image was acquired.  A Seldinger needle was used to access the right common femoral artery under direct ultrasound guidance and a permanent image was performed.  A 0.035 J wire was advanced without resistance and a 5Fr sheath was placed.  Pigtail catheter was placed into the aorta and an AP aortogram was performed. This demonstrated normal renal arteries and normal aorta and iliac segments without significant stenosis. I then crossed the aortic bifurcation and advanced to the left femoral head and then into the SFA to help opacify distally. Selective left lower extremity angiogram was  then performed. This demonstrated a calcified but not stenotic common femoral artery, profunda femoris artery, and superficial femoral artery.  The popliteal artery had 2 significant lesions that were difficult to opacify due to the knee prosthesis.  In the proximal popliteal artery there was a high-grade stenosis in the 95% range that was highly calcific.  There appeared to be an 80 to 90% stenosis in the distal popliteal artery.  There was a typical tibial trifurcation.  The anterior tibial artery was heavily diseased with multiple high-grade stenoses in the  proximal segment and then occluded in the midsegment although there were several collaterals that came off.  The peroneal artery was patent and continuous through its usual course to the ankle.  The posterior tibial artery was small and heavily diseased and appeared to have multiple areas of greater than 80% stenosis but did appear to be continuous to the foot although this was best seen on selective imaging with a catheter in the posterior tibial artery. It was felt that it was in the patient's best interest to proceed with intervention after these images to avoid a second procedure and a larger amount of contrast and fluoroscopy based off of the findings from the initial angiogram. The patient was systemically heparinized and a 6 [place] Ansell sheath was then placed over the [employer] wire. I then used a Kumpe catheter and the advantage wire to navigate through the popliteal lesions and then exchanged for a CXI catheter to get down into the anterior tibial artery.  Selective imaging of the anterior tibial artery did show the heavily diseased artery proximally with multiple areas of stenosis and occlusion in the midsegment.  There was not distal reconstitution to treat distally, but there were a large number of collaterals seen so I elected to treat the proximal anterior tibial artery in hope of improving the collateral flow.  A 2.5 mm diameter by 30 cm length angioplasty balloon was inflated from the proximal to mid anterior tibial artery up into the popliteal artery.  This was inflated to 16 atm for 1 minute.  The treated area showed no greater than 25% residual stenosis although there remained a distal occlusion.  I then treated the popliteal lesions with a 5 mm diameter by 15 cm likely tonics drug-coated angioplasty balloon to encompass both lesions.  This was inflated to 10 atm for 1 minute but both lesions remained greater than 50% residual stenosis after angioplasty.  I then turned my attention to the  posterior tibial artery before stenting the popliteal artery.  Using a seeker catheter and a 0.018 advantage wire was able to gain access to the posterior tibial artery and get the seeker catheter into the proximal to mid segment.  Selective imaging did show a diffusely diseased vessel and multiple areas of greater than 80% stenosis were identified so I felt it was worth treating this vessel to improve flow distally.  I then took the 0.018 advantage wire into the foot and used 2 inflations with a 2 mm diameter by 30 cm length angioplasty balloon to treat from the ankle up through the origin of the posterior tibial artery.  Both inflations were 10 to 12 atm for a minute.  I then turned my attention to the popliteal lesions that were residual.  A 6 mm diameter by 15 cm length Viabahn stent was then deployed to encompass both lesions and ensure that we stayed above the origin of the anterior tibial artery.  This was postdilated with a 5  mm balloon.  There was less than 10% residual stenosis in the popliteal artery after stent placement.  I did not see any areas of greater than 25% residual stenosis in the posterior tibial artery and the peroneal artery remained continuous providing good flow distally as well. I elected to terminate the procedure. The sheath was removed and StarClose closure device was deployed in the right femoral artery with excellent hemostatic result. The patient was taken to the recovery room in stable condition having tolerated the procedure well.  Findings:               Aortogram:  This demonstrated normal renal arteries and normal aorta and iliac segments without significant stenosis             Left lower Extremity:  Calcified but not stenotic common femoral artery, profunda femoris artery, and superficial femoral artery.  The popliteal artery had 2 significant lesions that were difficult to opacify due to the knee prosthesis.  In the proximal popliteal artery there was a high-grade  stenosis in the 95% range that was highly calcific.  There appeared to be an 80 to 90% stenosis in the distal popliteal artery.  There was a typical tibial trifurcation.  The anterior tibial artery was heavily diseased with multiple high-grade stenoses in the proximal segment and then occluded in the midsegment although there were several collaterals that came off.  The peroneal artery was patent and continuous through its usual course to the ankle.  The posterior tibial artery was small and heavily diseased and appeared to have multiple areas of greater than 80% stenosis but did appear to be continuous to the foot although this was best seen on selective imaging with a catheter in the posterior tibial artery.   Disposition: Patient was taken to the recovery room in stable condition having tolerated the procedure well.  Complications: None  Leotis Pain 10/09/2019 12:16 PM   This note was created with Dragon Medical transcription system. Any errors in dictation are purely unintentional.

## 2019-10-09 NOTE — Discharge Instructions (Signed)
Moderate Conscious Sedation, Adult, Care After °These instructions provide you with information about caring for yourself after your procedure. Your health care provider may also give you more specific instructions. Your treatment has been planned according to current medical practices, but problems sometimes occur. Call your health care provider if you have any problems or questions after your procedure. °What can I expect after the procedure? °After your procedure, it is common: °· To feel sleepy for several hours. °· To feel clumsy and have poor balance for several hours. °· To have poor judgment for several hours. °· To vomit if you eat too soon. °Follow these instructions at home: °For at least 24 hours after the procedure: ° °· Do not: °? Participate in activities where you could fall or become injured. °? Drive. °? Use heavy machinery. °? Drink alcohol. °? Take sleeping pills or medicines that cause drowsiness. °? Make important decisions or sign legal documents. °? Take care of children on your own. °· Rest. °Eating and drinking °· Follow the diet recommended by your health care provider. °· If you vomit: °? Drink water, juice, or soup when you can drink without vomiting. °? Make sure you have little or no nausea before eating solid foods. °General instructions °· Have a responsible adult stay with you until you are awake and alert. °· Take over-the-counter and prescription medicines only as told by your health care provider. °· If you smoke, do not smoke without supervision. °· Keep all follow-up visits as told by your health care provider. This is important. °Contact a health care provider if: °· You keep feeling nauseous or you keep vomiting. °· You feel light-headed. °· You develop a rash. °· You have a fever. °Get help right away if: °· You have trouble breathing. °This information is not intended to replace advice given to you by your health care provider. Make sure you discuss any questions you have  with your health care provider. °Document Released: 08/16/2013 Document Revised: 10/08/2017 Document Reviewed: 02/15/2016 °Elsevier Patient Education © 2020 [employer] °Angiogram, Care After °This sheet gives you information about how to care for yourself after your procedure. Your health care provider may also give you more specific instructions. If you have problems or questions, contact your health care provider. °What can I expect after the procedure? °After the procedure, it is common to have bruising and tenderness at the catheter insertion area. °Follow these instructions at home: °Insertion site care °· Follow instructions from your health care provider about how to take care of your insertion site. Make sure you: °? Wash your hands with soap and water before you change your bandage (dressing). If soap and water are not available, use hand sanitizer. °? Change your dressing as told by your health care provider. °? Leave stitches (sutures), skin glue, or adhesive strips in place. These skin closures may need to stay in place for 2 weeks or longer. If adhesive strip edges start to loosen and curl up, you may trim the loose edges. Do not remove adhesive strips completely unless your health care provider tells you to do that. °· Do not take baths, swim, or use a hot tub until your health care provider approves. °· You may shower 24-48 hours after the procedure or as told by your health care provider. °? Gently wash the site with plain soap and water. °? Pat the area dry with a clean towel. °? Do not rub the site. This may cause bleeding. °· Do not apply   powder or lotion to the site. Keep the site clean and dry. °· Check your insertion site every day for signs of infection. Check for: °? Redness, swelling, or pain. °? Fluid or blood. °? Warmth. °? Pus or a bad smell. °Activity °· Rest as told by your health care provider, usually for 1-2 days. °· Do not lift anything that is heavier than 10 lbs. (4.5 kg) or as  told by your health care provider. °· Do not drive for 24 hours if you were given a medicine to help you relax (sedative). °· Do not drive or use heavy machinery while taking prescription pain medicine. °General instructions ° °· Return to your normal activities as told by your health care provider, usually in about a week. Ask your health care provider what activities are safe for you. °· If the catheter site starts bleeding, lie flat and put pressure on the site. If the bleeding does not stop, get help right away. This is a medical emergency. °· Drink enough fluid to keep your urine clear or pale yellow. This helps flush the contrast dye from your body. °· Take over-the-counter and prescription medicines only as told by your health care provider. °· Keep all follow-up visits as told by your health care provider. This is important. °Contact a health care provider if: °· You have a fever or chills. °· You have redness, swelling, or pain around your insertion site. °· You have fluid or blood coming from your insertion site. °· The insertion site feels warm to the touch. °· You have pus or a bad smell coming from your insertion site. °· You have bruising around the insertion site. °· You notice blood collecting in the tissue around the catheter site (hematoma). The hematoma may be painful to the touch. °Get help right away if: °· You have severe pain at the catheter insertion area. °· The catheter insertion area swells very fast. °· The catheter insertion area is bleeding, and the bleeding does not stop when you hold steady pressure on the area. °· The area near or just beyond the catheter insertion site becomes pale, cool, tingly, or numb. °These symptoms may represent a serious problem that is an emergency. Do not wait to see if the symptoms will go away. Get medical help right away. Call your local emergency services (911 in the [place] Do not drive yourself to the hospital. °Summary °· After the procedure, it is  common to have bruising and tenderness at the catheter insertion area. °· After the procedure, it is important to rest and drink plenty of fluids. °· Do not take baths, swim, or use a hot tub until your health care provider says it is okay to do so. You may shower 24-48 hours after the procedure or as told by your health care provider. °· If the catheter site starts bleeding, lie flat and put pressure on the site. If the bleeding does not stop, get help right away. This is a medical emergency. °This information is not intended to replace advice given to you by your health care provider. Make sure you discuss any questions you have with your health care provider. °Document Released: 05/14/2005 Document Revised: 10/08/2017 Document Reviewed: 09/30/2016 °Elsevier Patient Education © 2020 Elsevier Inc. ° °

## 2019-10-09 NOTE — H&P (Signed)
[facility] History & Physical Update  The patient was interviewed and re-examined.  The patient's previous History and Physical has been reviewed and is unchanged.  There is no change in the plan of care. We plan to proceed with the scheduled procedure.  Leotis Pain, MD  10/09/2019, 9:30 AM

## 2019-10-11 ENCOUNTER — Other Ambulatory Visit (HOSPITAL_BASED_OUTPATIENT_CLINIC_OR_DEPARTMENT_OTHER): Payer: PRIVATE HEALTH INSURANCE | Admitting: Internal Medicine

## 2019-10-11 ENCOUNTER — Ambulatory Visit: Admit: 2019-10-11 | Payer: PRIVATE HEALTH INSURANCE | Admitting: Internal Medicine

## 2019-10-11 SURGERY — ESOPHAGOGASTRODUODENOSCOPY (EGD) WITH PROPOFOL
Anesthesia: General

## 2019-10-12 ENCOUNTER — Telehealth (INDEPENDENT_AMBULATORY_CARE_PROVIDER_SITE_OTHER): Payer: Self-pay

## 2019-10-12 NOTE — Telephone Encounter (Signed)
Patient was made aware with medical advice and verbalize understanding 

## 2019-10-13 ENCOUNTER — Other Ambulatory Visit: Payer: Self-pay

## 2019-10-13 ENCOUNTER — Other Ambulatory Visit (INDEPENDENT_AMBULATORY_CARE_PROVIDER_SITE_OTHER): Payer: Medicare Other

## 2019-10-13 ENCOUNTER — Other Ambulatory Visit (INDEPENDENT_AMBULATORY_CARE_PROVIDER_SITE_OTHER): Payer: Self-pay | Admitting: Nurse Practitioner

## 2019-10-13 ENCOUNTER — Encounter (INDEPENDENT_AMBULATORY_CARE_PROVIDER_SITE_OTHER): Payer: Self-pay | Admitting: Nurse Practitioner

## 2019-10-13 ENCOUNTER — Ambulatory Visit (INDEPENDENT_AMBULATORY_CARE_PROVIDER_SITE_OTHER): Payer: Medicare Other | Admitting: Nurse Practitioner

## 2019-10-13 VITALS — BP 151/70 | HR 71 | Resp 16 | Wt 181.0 lb

## 2019-10-13 DIAGNOSIS — E1122 Type 2 diabetes mellitus with diabetic chronic kidney disease: Secondary | ICD-10-CM

## 2019-10-13 DIAGNOSIS — K219 Gastro-esophageal reflux disease without esophagitis: Secondary | ICD-10-CM | POA: Diagnosis not present

## 2019-10-13 DIAGNOSIS — N183 Chronic kidney disease, stage 3 unspecified: Secondary | ICD-10-CM | POA: Diagnosis not present

## 2019-10-13 DIAGNOSIS — I739 Peripheral vascular disease, unspecified: Secondary | ICD-10-CM | POA: Diagnosis not present

## 2019-10-13 DIAGNOSIS — I7025 Atherosclerosis of native arteries of other extremities with ulceration: Secondary | ICD-10-CM

## 2019-10-16 ENCOUNTER — Encounter (INDEPENDENT_AMBULATORY_CARE_PROVIDER_SITE_OTHER): Payer: Self-pay | Admitting: Nurse Practitioner

## 2019-10-16 NOTE — Progress Notes (Signed)
SUBJECTIVE:  Patient ID: Bobby Warner., male    DOB: Apr 05, 1941, 78 y.o.   MRN: [id number] Chief Complaint  Patient presents with  . Follow-up    HPI  Bobby Warner. is a 78 y.o. male presents today after a left lower extremity angiogram on 10/09/2019.  The patient's wife is concerned due to swelling and redness of his left lower extremity.  The patient continues to have some pain under his left foot following angiogram.  Any previous ulcerations have healed as none are present today.  The patient does have some extensive swelling in his foot.  There is some redness noted however nothing is of concern for ischemia.  No color changes that are purple, blue or black.  The foot is warm.  Today the patient underwent bilateral ABIs.  The right ABI is 0.66 with a TBI 0.46.  His left ABI is 1.22 with a TBI of 0.38.  Previously on 08/16/2019 his ABIs were noncompressible.  He also had a TBI 0.45 previously.  The patient previously had absent anterior tibial artery waveforms with a monophasic posterior tibial artery and biphasic peroneal.  Today all waveforms in the left lower extremity are monophasic however these are strong monophasic waveforms with good toe waveforms.  The right lower extremity has monophasic tibial artery waveforms also with good toe waveforms.  Past Medical History:  Diagnosis Date  . Arthritis   . Back pain   . Coronary artery disease   . Diabetes mellitus without complication (HCC)   . GERD (gastroesophageal reflux disease)   . History of hiatal hernia   . Hyperlipidemia   . Hypertension   . Sleep apnea     Past Surgical History:  Procedure Laterality Date  . APPENDECTOMY    . CARDIAC CATHETERIZATION    . CHOLECYSTECTOMY    . COLONOSCOPY WITH ESOPHAGOGASTRODUODENOSCOPY (EGD)    . COLONOSCOPY WITH PROPOFOL N/A 03/16/2016   Procedure: COLONOSCOPY WITH PROPOFOL;  Surgeon: [name], MD;  Location[facility] ENDOSCOPY;  Service: Endoscopy;  Laterality: N/A;   . EYE SURGERY    . HERNIA REPAIR    . LOWER EXTREMITY ANGIOGRAPHY Left 10/09/2019   Procedure: LOWER EXTREMITY ANGIOGRAPHY;  Surgeon: Annice Needy, MD;  Location: [facility] INVASIVE CV LAB;  Service: Cardiovascular;  Laterality: Left;  . NISSEN FUNDOPLICATION    . testicular vein surgery    . TONSILLECTOMY    . TOTAL KNEE ARTHROPLASTY      Social History   Socioeconomic History  . Marital status: Married    Spouse name: Nilesh Stegall  . Number of children: 1  . Years of education: Not on file  . Highest education level: Not on file  Occupational History  . Not on file  Social Needs  . Financial resource strain: Not on file  . Food insecurity    Worry: Not on file    Inability: Not on file  . Transportation needs    Medical: Not on file    Non-medical: Not on file  Tobacco Use  . Smoking status: Former Games developer  . Smokeless tobacco: Never Used  Substance and Sexual Activity  . Alcohol use: Yes    Alcohol/week: 1.0 standard drinks    Types: 1 Cans of beer per week  . Drug use: No  . Sexual activity: Not on file  Lifestyle  . Physical activity    Days per week: Not on file    Minutes per session: Not on file  .  Stress: Not on file  Relationships  . Social Herbalist on phone: Not on file    Gets together: Not on file    Attends religious service: Not on file    Active member of club or organization: Not on file    Attends meetings of clubs or organizations: Not on file    Relationship status: Not on file  . Intimate partner violence    Fear of current or ex partner: Not on file    Emotionally abused: Not on file    Physically abused: Not on file    Forced sexual activity: Not on file  Other Topics Concern  . Not on file  Social History Narrative  . Not on file    History reviewed. No pertinent family history.  Allergies  Allergen Reactions  . Nsaids   . Tolmetin      Review of Systems   Review of Systems: Negative Unless Checked Constitutional:  [] Weight loss  [] Fever  [] Chills Cardiac: [] Chest pain   []  Atrial Fibrillation  [] Palpitations   [] Shortness of breath when laying flat   [] Shortness of breath with exertion. [] Shortness of breath at rest Vascular:  [] Pain in legs with walking   [x] Pain in legs with standing [] Pain in legs when laying flat   [] Claudication    [] Pain in feet when laying flat    [] History of DVT   [] Phlebitis   [x] Swelling in legs   [] Varicose veins   [] Non-healing ulcers Pulmonary:   [] Uses home oxygen   [] Productive cough   [] Hemoptysis   [] Wheeze  [] COPD   [] Asthma Neurologic:  [] Dizziness   [] Seizures  [] Blackouts [] History of stroke   [] History of TIA  [] Aphasia   [] Temporary Blindness   [] Weakness or numbness in arm   [] Weakness or numbness in leg Musculoskeletal:   [] Joint swelling   [] Joint pain   [] Low back pain  []  History of Knee Replacement [] Arthritis [] back Surgeries  []  Spinal Stenosis    Hematologic:  [] Easy bruising  [] Easy bleeding   [] Hypercoagulable state   [] Anemic Gastrointestinal:  [] Diarrhea   [] Vomiting  [x] Gastroesophageal reflux/heartburn   [] Difficulty swallowing. [] Abdominal pain Genitourinary:  [] Chronic kidney disease   [] Difficult urination  [] Anuric   [] Blood in urine [] Frequent urination  [] Burning with urination   [] Hematuria Skin:  [] Rashes   [] Ulcers [] Wounds Psychological:  [] History of anxiety   []  History of major depression  []  Memory Difficulties      OBJECTIVE:   Physical Exam  BP (!) 151/70 (BP Location: Right Arm)   Pulse 71   Resp 16   Wt 181 lb (82.1 kg)   BMI 25.97 kg/m   Gen: WD/WN, NAD Head: [place]/AT, No temporalis wasting.  Ear/Nose/Throat: Hearing grossly intact, nares w/o erythema or drainage Eyes: PER, EOMI, sclera nonicteric.  Neck: Supple, no masses.  No JVD.  Pulmonary:  Good air movement, no use of accessory muscles.  Cardiac: RRR Vascular:  3+ pedal edema on left foot. Vessel Right Left  Radial Palpable Palpable  Dorsalis Pedis  not palpable   not palpable  Posterior Tibial  not palpable  not palpable   Gastrointestinal: soft, non-distended. No guarding/no peritoneal signs.  Musculoskeletal: M/S 5/5 throughout.  No deformity or atrophy.  Neurologic: Pain and light touch intact in extremities.  Symmetrical.  Speech is fluent. Motor exam as listed above. Psychiatric: Judgment intact, Mood & affect appropriate for pt's clinical situation. Dermatologic: No Venous rashes. No Ulcers Noted.  No changes  consistent with cellulitis. Lymph : No Cervical lymphadenopathy, no lichenification or skin changes of chronic lymphedema.       ASSESSMENT AND PLAN:  1. Atherosclerosis of native arteries of the extremities with ulceration (HCC) Based upon noninvasive studies as well as patient's presentation this is strongly consistent with reperfusion syndrome.  I discussed with the patient and wife concerning symptoms such as sudden coldness of the foot or color changes that include the toes being blue, black or purple.  These colors also do not change with elevation.  We also discussed the causes of reperfusion syndrome and why certain things occur such as the redness and swelling.  Today we have placed a light compression wrap on the patient's left foot to be left in place for 2 to 3 days the patient can then remove at home.  This is to help gain control of the swelling since the patient was placed on compression socks that he has at home.  The lower foot pain was present prior to angiogram and the continued pain is likely due to some reperfusion symptoms as well as the need for nerves to possibly heal from lack of blood flow previously.  The patient has an upcoming appointment.  We will keep this appointment and reassess the patient's pain and swelling and blood flow.  Patient and wife are advised to contact us sooner if they are concerned about patient's blood flow.  2. Gastroesophageal reflux disease, unspecified whether esophagitis present Continue PPI  as already ordered, this medication has been reviewed and there are no changes at this time.  Avoidence of caffeine and alcohol  Moderate elevation of the head of the bed   3. Diabetes mellitus with stage 3 chronic kidney disease (HCC) Continue hypoglycemic medications as already ordered, these medications have been reviewed and there are no changes at this time.  Hgb A1C to be monitored as already arranged by primary service    Current Outpatient Medications on File Prior to Visit  Medication Sig Dispense Refill  . acetaminophen (TYLENOL) 500 MG tablet Take 1,000 mg by mouth every 6 (six) hours as needed.    Marland Kitchen aspirin 81 MG tablet Take 81 mg by mouth daily.    Marland Kitchen atorvastatin (LIPITOR) 40 MG tablet Take 40 mg by mouth daily.    Marland Kitchen b complex vitamins capsule Take 1 capsule by mouth daily.    . cetirizine (ZYRTEC) 10 MG chewable tablet Chew 10 mg by mouth daily.    . clopidogrel (PLAVIX) 75 MG tablet Take 75 mg by mouth daily.    . colchicine 0.6 MG tablet Take 0.6 mg by mouth 2 (two) times daily as needed.    Marland Kitchen glimepiride (AMARYL) 2 MG tablet TAKE 1 TABLET BY MOUTH ONCE DAILY WITH BREAKFAST    . JARDIANCE 10 MG TABS tablet TAKE 1 TABLET BY MOUTH ONCE DAILY WITH BREAKFAST    . ketorolac (ACULAR) 0.4 % SOLN 1 drop 4 (four) times daily.    Marland Kitchen losartan (COZAAR) 100 MG tablet Take 100 mg by mouth daily.    . methocarbamol (ROBAXIN) 500 MG tablet Take 500 mg by mouth daily.     . metoprolol succinate (TOPROL-XL) 100 MG 24 hr tablet Take 100 mg by mouth daily. Take with or immediately following a meal.    . omeprazole (PRILOSEC) 20 MG capsule Take 20 mg by mouth daily.    Marland Kitchen tiZANidine (ZANAFLEX) 4 MG tablet Take 4 mg by mouth 3 (three) times daily as needed.    Marland Kitchen  vitamin B-12 (CYANOCOBALAMIN) 1000 MCG tablet Take 1,000 mcg by mouth daily.     No current facility-administered medications on file prior to visit.     There are no Patient Instructions on file for this visit. No follow-ups on  file.   Georgiana SpinnerFallon E Dadrian Ballantine, NP  This note was completed with Office managerDragon Dictation.  Any errors are purely unintentional.

## 2019-10-23 ENCOUNTER — Other Ambulatory Visit: Payer: Self-pay

## 2019-10-23 ENCOUNTER — Telehealth: Payer: Self-pay | Admitting: Internal Medicine

## 2019-10-23 NOTE — Telephone Encounter (Signed)
Pt's spouse, Theda(DPR) is aware of date/time of covid test.   

## 2019-10-24 ENCOUNTER — Telehealth: Payer: Self-pay | Admitting: Pulmonary Disease

## 2019-10-24 NOTE — Telephone Encounter (Signed)
Spoke to pt's spouse, Paula Libra who stated that pt's mask fitting appt has been rescheduled for 11/08/2019. Paula Libra is aware that I will contact pt closer to scheduled appt to give date/time of covid test. Nothing further is needed.

## 2019-10-25 ENCOUNTER — Other Ambulatory Visit (HOSPITAL_BASED_OUTPATIENT_CLINIC_OR_DEPARTMENT_OTHER): Payer: PRIVATE HEALTH INSURANCE | Admitting: Internal Medicine

## 2019-10-26 ENCOUNTER — Other Ambulatory Visit (INDEPENDENT_AMBULATORY_CARE_PROVIDER_SITE_OTHER): Payer: Self-pay | Admitting: Nurse Practitioner

## 2019-10-26 DIAGNOSIS — Z95820 Peripheral vascular angioplasty status with implants and grafts: Secondary | ICD-10-CM

## 2019-10-26 DIAGNOSIS — I739 Peripheral vascular disease, unspecified: Secondary | ICD-10-CM

## 2019-11-01 ENCOUNTER — Ambulatory Visit (INDEPENDENT_AMBULATORY_CARE_PROVIDER_SITE_OTHER): Payer: Medicare Other

## 2019-11-01 ENCOUNTER — Encounter (INDEPENDENT_AMBULATORY_CARE_PROVIDER_SITE_OTHER): Payer: Self-pay | Admitting: Nurse Practitioner

## 2019-11-01 ENCOUNTER — Ambulatory Visit (INDEPENDENT_AMBULATORY_CARE_PROVIDER_SITE_OTHER): Payer: Medicare Other | Admitting: Nurse Practitioner

## 2019-11-01 ENCOUNTER — Other Ambulatory Visit: Payer: Self-pay

## 2019-11-01 VITALS — BP 138/70 | HR 67 | Resp 15 | Ht 70.0 in | Wt 184.0 lb

## 2019-11-01 DIAGNOSIS — I10 Essential (primary) hypertension: Secondary | ICD-10-CM

## 2019-11-01 DIAGNOSIS — I7025 Atherosclerosis of native arteries of other extremities with ulceration: Secondary | ICD-10-CM

## 2019-11-01 DIAGNOSIS — Z95820 Peripheral vascular angioplasty status with implants and grafts: Secondary | ICD-10-CM | POA: Diagnosis not present

## 2019-11-01 DIAGNOSIS — I739 Peripheral vascular disease, unspecified: Secondary | ICD-10-CM | POA: Diagnosis not present

## 2019-11-01 DIAGNOSIS — K219 Gastro-esophageal reflux disease without esophagitis: Secondary | ICD-10-CM

## 2019-11-02 ENCOUNTER — Other Ambulatory Visit: Payer: Self-pay

## 2019-11-02 ENCOUNTER — Telehealth: Payer: Self-pay | Admitting: Internal Medicine

## 2019-11-02 NOTE — Telephone Encounter (Signed)
Pt's spouse, Theda(DPR) is aware of date/time of covid test.

## 2019-11-06 ENCOUNTER — Other Ambulatory Visit: Payer: Self-pay

## 2019-11-06 ENCOUNTER — Other Ambulatory Visit
Admission: RE | Admit: 2019-11-06 | Discharge: 2019-11-06 | Disposition: A | Discharge status: Home / Self Care | Payer: Medicare Other | Source: Ambulatory Visit | Attending: Internal Medicine | Admitting: Internal Medicine

## 2019-11-06 ENCOUNTER — Encounter (INDEPENDENT_AMBULATORY_CARE_PROVIDER_SITE_OTHER): Payer: Self-pay | Admitting: Nurse Practitioner

## 2019-11-06 DIAGNOSIS — Z20828 Contact with and (suspected) exposure to other viral communicable diseases: Secondary | ICD-10-CM | POA: Diagnosis not present

## 2019-11-06 DIAGNOSIS — Z01812 Encounter for preprocedural laboratory examination: Secondary | ICD-10-CM | POA: Insufficient documentation

## 2019-11-06 LAB — SARS CORONAVIRUS 2 (TAT 6-24 HRS): SARS Coronavirus 2: NEGATIVE

## 2019-11-06 NOTE — Progress Notes (Signed)
SUBJECTIVE:  Patient ID: Bobby Cirri., male    DOB: 11/22/1940, 78 y.o.   MRN: [id number] Chief Complaint  Patient presents with  . Follow-up    HPI  Bobby Dunaj. is a 78 y.o. male that presents today after an urgent visit on 10/13/2019 with concern to the patient's left lower extremity edema and discomfort after angiogram on 10/09/2019.  At that time the patient's left lower extremity was markedly swollen and there was continued pain under his left foot.  At that time the ulcerations that the patient had had healed.  Today, the patient's lower extremity edema is much better.  No new ulcerations present.  The patient also states that the previous pain under his foot is gone.  He denies any fever, chills, nausea, vomiting or diarrhea.  No concerning color changes in the foot is warm.  Today the patient underwent bilateral ABIs.  The right ABI was noncompressible and the left was 1.26.  TBI on the right 0.57 and on the left 0.44.  Previously on 10/13/2019 the right ABI was 0.66 with a TBI 0.46.  The left ABI was 1.22 with a TBI 0.38.  The right lower extremity has biphasic/triphasic waveforms in the tibial arteries and the left lower extremity has triphasic waveforms in the tibial arteries.  The patient also has good toe waveforms bilaterally.  The patient's waveforms are definitely improved from the previous study on 10/13/2019 whereas the ABIs are consistent.  Past Medical History:  Diagnosis Date  . Arthritis   . Back pain   . Coronary artery disease   . Diabetes mellitus without complication ([place])   . GERD (gastroesophageal reflux disease)   . History of hiatal hernia   . Hyperlipidemia   . Hypertension   . Sleep apnea     Past Surgical History:  Procedure Laterality Date  . APPENDECTOMY    . CARDIAC CATHETERIZATION    . CHOLECYSTECTOMY    . COLONOSCOPY WITH ESOPHAGOGASTRODUODENOSCOPY (EGD)    . COLONOSCOPY WITH PROPOFOL N/A 03/16/2016   Procedure: COLONOSCOPY WITH  PROPOFOL;  Surgeon: Lollie Sails, MD;  Location: [facility] ENDOSCOPY;  Service: Endoscopy;  Laterality: N/A;  . EYE SURGERY    . HERNIA REPAIR    . LOWER EXTREMITY ANGIOGRAPHY Left 10/09/2019   Procedure: LOWER EXTREMITY ANGIOGRAPHY;  Surgeon: Algernon Huxley, MD;  Location: [place] [facility] LAB;  Service: Cardiovascular;  Laterality: Left;  . NISSEN FUNDOPLICATION    . testicular vein surgery    . TONSILLECTOMY    . TOTAL KNEE ARTHROPLASTY      Social History   Socioeconomic History  . Marital status: Married    Spouse name: Bobby Warner  . Number of children: 1  . Years of education: Not on file  . Highest education level: Not on file  Occupational History  . Not on file  Tobacco Use  . Smoking status: Former Research scientist (life sciences)  . Smokeless tobacco: Never Used  Substance and Sexual Activity  . Alcohol use: Yes    Alcohol/week: 1.0 standard drinks    Types: 1 Cans of beer per week  . Drug use: No  . Sexual activity: Not on file  Other Topics Concern  . Not on file  Social History Narrative  . Not on file   Social Determinants of Health   Financial Resource Strain:   . Difficulty of Paying Living Expenses: Not on file  Food Insecurity:   . Worried About Charity fundraiser in  the Last Year: Not on file  . Ran Out of Food in the Last Year: Not on file  Transportation Needs:   . Lack of Transportation (Medical): Not on file  . Lack of Transportation (Non-Medical): Not on file  Physical Activity:   . Days of Exercise per Week: Not on file  . Minutes of Exercise per Session: Not on file  Stress:   . Feeling of Stress : Not on file  Social Connections:   . Frequency of Communication with Friends and Family: Not on file  . Frequency of Social Gatherings with Friends and Family: Not on file  . Attends Religious Services: Not on file  . Active Member of Clubs or Organizations: Not on file  . Attends BankerClub or Organization Meetings: Not on file  . Marital Status: Not on file  Intimate  Partner Violence:   . Fear of Current or Ex-Partner: Not on file  . Emotionally Abused: Not on file  . Physically Abused: Not on file  . Sexually Abused: Not on file    History reviewed. No pertinent family history.  Allergies  Allergen Reactions  . Nsaids   . Tolmetin      Review of Systems   Review of Systems: Negative Unless Checked Constitutional: [] Weight loss  [] Fever  [] Chills Cardiac: [] Chest pain   []  Atrial Fibrillation  [] Palpitations   [] Shortness of breath when laying flat   [] Shortness of breath with exertion. [] Shortness of breath at rest Vascular:  [] Pain in legs with walking   [] Pain in legs with standing [] Pain in legs when laying flat   [x] Claudication    [] Pain in feet when laying flat    [] History of DVT   [] Phlebitis   [x] Swelling in legs   [] Varicose veins   [] Non-healing ulcers Pulmonary:   [] Uses home oxygen   [] Productive cough   [] Hemoptysis   [] Wheeze  [] COPD   [] Asthma Neurologic:  [] Dizziness   [] Seizures  [] Blackouts [] History of stroke   [] History of TIA  [] Aphasia   [] Temporary Blindness   [] Weakness or numbness in arm   [] Weakness or numbness in leg Musculoskeletal:   [] Joint swelling   [] Joint pain   [] Low back pain  []  History of Knee Replacement [] Arthritis [] back Surgeries  []  Spinal Stenosis    Hematologic:  [] Easy bruising  [] Easy bleeding   [] Hypercoagulable state   [] Anemic Gastrointestinal:  [] Diarrhea   [] Vomiting  [x] Gastroesophageal reflux/heartburn   [] Difficulty swallowing. [] Abdominal pain Genitourinary:  [] Chronic kidney disease   [] Difficult urination  [] Anuric   [] Blood in urine [] Frequent urination  [] Burning with urination   [] Hematuria Skin:  [] Rashes   [] Ulcers [] Wounds Psychological:  [] History of anxiety   []  History of major depression  []  Memory Difficulties      OBJECTIVE:   Physical Exam  BP 138/70 (BP Location: Right Arm)   Pulse 67   Resp 15   Ht 5\' 10"  (1.778 m)   Wt 184 lb (83.5 kg)   BMI 26.40 kg/m   Gen:  WD/WN, NAD Head: [place]/AT, No temporalis wasting.  Ear/Nose/Throat: Hearing grossly intact, nares w/o erythema or drainage Eyes: PER, EOMI, sclera nonicteric.  Neck: Supple, no masses.  No JVD.  Pulmonary:  Good air movement, no use of accessory muscles.  Cardiac: RRR Vascular:  1+ edema left lower extremity Vessel Right Left  Radial Palpable Palpable  Dorsalis Pedis Not Palpable Not Palpable  Posterior Tibial Not Palpable Not Palpable   Gastrointestinal: soft, non-distended. No guarding/no peritoneal signs.  Musculoskeletal: M/S 5/5 throughout.  No deformity or atrophy.  Neurologic: Pain and light touch intact in extremities.  Symmetrical.  Speech is fluent. Motor exam as listed above. Psychiatric: Judgment intact, Mood & affect appropriate for pt's clinical situation. Dermatologic: No Venous rashes. No Ulcers Noted.  No changes consistent with cellulitis. Lymph : No Cervical lymphadenopathy, no lichenification or skin changes of chronic lymphedema.       ASSESSMENT AND PLAN:  1. Atherosclerosis of native arteries of the extremities with ulceration (HCC)  Recommend:  The patient has evidence of atherosclerosis of the lower extremities with claudication.  The patient does not voice lifestyle limiting changes at this point in time.  Noninvasive studies do not suggest clinically significant change.  No invasive studies, angiography or surgery at this time The patient should continue walking and begin a more formal exercise program.  The patient should continue antiplatelet therapy and aggressive treatment of the lipid abnormalities  No changes in the patient's medications at this time  The patient should continue wearing graduated compression socks 10-15 mmHg strength to control the mild edema.    2. Gastroesophageal reflux disease, unspecified whether esophagitis present Continue PPI as already ordered, this medication has been reviewed and there are no changes at this  time.  Avoidence of caffeine and alcohol  Moderate elevation of the head of the bed   3. HTN (hypertension), benign Continue antihypertensive medications as already ordered, these medications have been reviewed and there are no changes at this time.    Current Outpatient Medications on File Prior to Visit  Medication Sig Dispense Refill  . acetaminophen (TYLENOL) 500 MG tablet Take 1,000 mg by mouth every 6 (six) hours as needed.    Marland Kitchen aspirin 81 MG tablet Take 81 mg by mouth daily.    Marland Kitchen atorvastatin (LIPITOR) 40 MG tablet Take 40 mg by mouth daily.    Marland Kitchen b complex vitamins capsule Take 1 capsule by mouth daily.    . cetirizine (ZYRTEC) 10 MG chewable tablet Chew 10 mg by mouth daily.    . clopidogrel (PLAVIX) 75 MG tablet Take 75 mg by mouth daily.    . colchicine 0.6 MG tablet Take 0.6 mg by mouth 2 (two) times daily as needed.    Marland Kitchen glimepiride (AMARYL) 2 MG tablet TAKE 1 TABLET BY MOUTH ONCE DAILY WITH BREAKFAST    . JARDIANCE 10 MG TABS tablet TAKE 1 TABLET BY MOUTH ONCE DAILY WITH BREAKFAST    . ketorolac (ACULAR) 0.4 % SOLN 1 drop 4 (four) times daily.    Marland Kitchen losartan (COZAAR) 100 MG tablet Take 100 mg by mouth daily.    . methocarbamol (ROBAXIN) 500 MG tablet Take 500 mg by mouth daily.     . metoprolol succinate (TOPROL-XL) 100 MG 24 hr tablet Take 100 mg by mouth daily. Take with or immediately following a meal.    . omeprazole (PRILOSEC) 20 MG capsule Take 20 mg by mouth daily.    Marland Kitchen tiZANidine (ZANAFLEX) 4 MG tablet Take 4 mg by mouth 3 (three) times daily as needed.    . vitamin B-12 (CYANOCOBALAMIN) 1000 MCG tablet Take 1,000 mcg by mouth daily.     No current facility-administered medications on file prior to visit.    There are no Patient Instructions on file for this visit. No follow-ups on file.   Georgiana Spinner, NP  This note was completed with Office manager.  Any errors are purely unintentional.

## 2019-11-08 ENCOUNTER — Ambulatory Visit (HOSPITAL_BASED_OUTPATIENT_CLINIC_OR_DEPARTMENT_OTHER): Payer: Medicare Other | Attending: Internal Medicine | Admitting: Internal Medicine

## 2019-11-08 ENCOUNTER — Other Ambulatory Visit: Payer: Self-pay

## 2019-11-08 DIAGNOSIS — G4733 Obstructive sleep apnea (adult) (pediatric): Secondary | ICD-10-CM

## 2019-12-20 ENCOUNTER — Ambulatory Visit: Payer: PRIVATE HEALTH INSURANCE | Admitting: Internal Medicine

## 2020-01-29 ENCOUNTER — Other Ambulatory Visit (INDEPENDENT_AMBULATORY_CARE_PROVIDER_SITE_OTHER): Payer: Self-pay | Admitting: Nurse Practitioner

## 2020-01-29 DIAGNOSIS — I70249 Atherosclerosis of native arteries of left leg with ulceration of unspecified site: Secondary | ICD-10-CM

## 2020-01-30 ENCOUNTER — Ambulatory Visit (INDEPENDENT_AMBULATORY_CARE_PROVIDER_SITE_OTHER): Payer: Medicare Other

## 2020-01-30 ENCOUNTER — Other Ambulatory Visit: Payer: Self-pay

## 2020-01-30 ENCOUNTER — Ambulatory Visit (INDEPENDENT_AMBULATORY_CARE_PROVIDER_SITE_OTHER): Payer: Medicare Other | Admitting: Vascular Surgery

## 2020-01-30 ENCOUNTER — Encounter (INDEPENDENT_AMBULATORY_CARE_PROVIDER_SITE_OTHER): Payer: Self-pay | Admitting: Vascular Surgery

## 2020-01-30 VITALS — BP 149/66 | HR 58 | Resp 16 | Wt 186.0 lb

## 2020-01-30 DIAGNOSIS — I10 Essential (primary) hypertension: Secondary | ICD-10-CM

## 2020-01-30 DIAGNOSIS — N183 Chronic kidney disease, stage 3 unspecified: Secondary | ICD-10-CM | POA: Diagnosis not present

## 2020-01-30 DIAGNOSIS — I7025 Atherosclerosis of native arteries of other extremities with ulceration: Secondary | ICD-10-CM | POA: Diagnosis not present

## 2020-01-30 DIAGNOSIS — I70249 Atherosclerosis of native arteries of left leg with ulceration of unspecified site: Secondary | ICD-10-CM

## 2020-01-30 DIAGNOSIS — E1122 Type 2 diabetes mellitus with diabetic chronic kidney disease: Secondary | ICD-10-CM | POA: Diagnosis not present

## 2020-01-30 NOTE — Patient Instructions (Signed)

## 2020-01-30 NOTE — Progress Notes (Signed)
MRN : [id number]  Bobby SAHA [name] is a 79 y.o. ([birth year]) male who presents with chief complaint of  Chief Complaint  Patient presents with  . Follow-up    17month ultrasound follow up  .  History of Present Illness: Patient returns today in follow up of his PAD.  Last fall, he underwent left lower extremity revascularization which has resulted in 1 healing.  He still has a callus on the foot and some intermittent issues with the area, but overall is doing well.  He remains on aspirin, Plavix, and a statin agent and seems to be tolerating these well.  No ischemic rest pain or new ulceration.  No lifestyle limiting claudication. ABIs today are noncompressible on the right but with good waveforms and digital pressures and 1.26 on the left with good waveforms as well.  Current Outpatient Medications  Medication Sig Dispense Refill  . acetaminophen (TYLENOL) 500 MG tablet Take 1,000 mg by mouth every 6 (six) hours as needed.    Marland Kitchen aspirin 81 MG tablet Take 81 mg by mouth daily.    Marland Kitchen atorvastatin (LIPITOR) 40 MG tablet Take 40 mg by mouth daily.    Marland Kitchen b complex vitamins capsule Take 1 capsule by mouth daily.    . cetirizine (ZYRTEC) 10 MG chewable tablet Chew 10 mg by mouth daily.    . clopidogrel (PLAVIX) 75 MG tablet Take 75 mg by mouth daily.    . colchicine 0.6 MG tablet Take 0.6 mg by mouth 2 (two) times daily as needed.    Marland Kitchen glimepiride (AMARYL) 2 MG tablet TAKE 1 TABLET BY MOUTH ONCE DAILY WITH BREAKFAST    . JARDIANCE 10 MG TABS tablet TAKE 1 TABLET BY MOUTH ONCE DAILY WITH BREAKFAST    . ketorolac (ACULAR) 0.4 % SOLN 1 drop 4 (four) times daily.    Marland Kitchen losartan (COZAAR) 100 MG tablet Take 100 mg by mouth daily.    . methocarbamol (ROBAXIN) 500 MG tablet Take 500 mg by mouth daily.     . metoprolol succinate (TOPROL-XL) 100 MG 24 hr tablet Take 100 mg by mouth daily. Take with or immediately following a meal.    . omeprazole (PRILOSEC) 20 MG capsule Take 20 mg by mouth daily.    Marland Kitchen  tiZANidine (ZANAFLEX) 4 MG tablet Take 4 mg by mouth 3 (three) times daily as needed.    . vitamin B-12 (CYANOCOBALAMIN) 1000 MCG tablet Take 1,000 mcg by mouth daily.     No current facility-administered medications for this visit.    Past Medical History:  Diagnosis Date  . Arthritis   . Back pain   . Coronary artery disease   . Diabetes mellitus without complication ([place])   . GERD (gastroesophageal reflux disease)   . History of hiatal hernia   . Hyperlipidemia   . Hypertension   . Sleep apnea     Past Surgical History:  Procedure Laterality Date  . APPENDECTOMY    . CARDIAC CATHETERIZATION    . CHOLECYSTECTOMY    . COLONOSCOPY WITH ESOPHAGOGASTRODUODENOSCOPY (EGD)    . COLONOSCOPY WITH PROPOFOL N/A 03/16/2016   Procedure: COLONOSCOPY WITH PROPOFOL;  Surgeon: Lollie Sails, MD;  Location: [facility] ENDOSCOPY;  Service: Endoscopy;  Laterality: N/A;  . EYE SURGERY    . HERNIA REPAIR    . LOWER EXTREMITY ANGIOGRAPHY Left 10/09/2019   Procedure: LOWER EXTREMITY ANGIOGRAPHY;  Surgeon: Algernon Huxley, MD;  Location: [place] [facility] LAB;  Service: Cardiovascular;  Laterality: Left;  .  NISSEN FUNDOPLICATION    . testicular vein surgery    . TONSILLECTOMY    . TOTAL KNEE ARTHROPLASTY       Social History   Tobacco Use  . Smoking status: Former Games developer  . Smokeless tobacco: Never Used  Substance Use Topics  . Alcohol use: Yes    Alcohol/week: 1.0 standard drinks    Types: 1 Cans of beer per week  . Drug use: No    Family History  Problem Relation Age of Onset  . Varicose Veins Neg Hx   . Vision loss Neg Hx   . Stroke Neg Hx      Allergies  Allergen Reactions  . Nsaids   . Tolmetin      REVIEW OF SYSTEMS (Negative unless checked)  Constitutional: [] Weight loss  [] Fever  [] Chills Cardiac: [] Chest pain   [] Chest pressure   [] Palpitations   [] Shortness of breath when laying flat   [] Shortness of breath at rest   [] Shortness of breath with exertion. Vascular:   [] Pain in legs with walking   [] Pain in legs at rest   [] Pain in legs when laying flat   [] Claudication   [] Pain in feet when walking  [] Pain in feet at rest  [] Pain in feet when laying flat   [] History of DVT   [] Phlebitis   [] Swelling in legs   [] Varicose veins   [] Non-healing ulcers Pulmonary:   [] Uses home oxygen   [] Productive cough   [] Hemoptysis   [] Wheeze  [] COPD   [] Asthma Neurologic:  [] Dizziness  [] Blackouts   [] Seizures   [] History of stroke   [] History of TIA  [] Aphasia   [] Temporary blindness   [] Dysphagia   [] Weakness or numbness in arms   [] Weakness or numbness in legs Musculoskeletal:  [x] Arthritis   [] Joint swelling   [x] Joint pain   [x] Low back pain Hematologic:  [] Easy bruising  [] Easy bleeding   [] Hypercoagulable state   [] Anemic   Gastrointestinal:  [] Blood in stool   [] Vomiting blood  [x] Gastroesophageal reflux/heartburn   [] Abdominal pain Genitourinary:  [] Chronic kidney disease   [] Difficult urination  [] Frequent urination  [] Burning with urination   [] Hematuria Skin:  [] Rashes   [] Ulcers   [] Wounds Psychological:  [] History of anxiety   []  History of major depression.  Physical Examination  BP (!) 149/66 (BP Location: Right Arm)   Pulse (!) 58   Resp 16   Wt 186 lb (84.4 kg)   BMI 26.69 kg/m  Gen:  WD/WN, NAD.  Appears younger than stated age Head: [place]/AT, No temporalis wasting. Ear/Nose/Throat: Hearing grossly intact, nares w/o erythema or drainage Eyes: Conjunctiva clear. Sclera non-icteric Neck: Supple.  Trachea midline Pulmonary:  Good air movement, no use of accessory muscles.  Cardiac: RRR, no JVD Vascular:  Vessel Right Left  Radial Palpable Palpable                          PT  2+ palpable  1+ palpable  DP  1+ palpable  1+ palpable   Gastrointestinal: soft, non-tender/non-distended. No guarding/reflex.  Musculoskeletal: M/S 5/5 throughout.  No deformity or atrophy.  No edema. Neurologic: Sensation grossly intact in extremities.  Symmetrical.   Speech is fluent.  Psychiatric: Judgment intact, Mood & affect appropriate for pt's clinical situation. Dermatologic: No rashes or ulcers noted.  No cellulitis or open wounds.       Labs Recent Results (from the past 2160 hour(s))  SARS CORONAVIRUS 2 (TAT 6-24 HRS) Nasopharyngeal  Nasopharyngeal Swab     Status: None   Collection Time: 11/06/19  9:18 AM   Specimen: Nasopharyngeal Swab  Result Value Ref Range   SARS Coronavirus 2 NEGATIVE NEGATIVE    Comment: (NOTE) SARS-CoV-2 target nucleic acids are NOT DETECTED. The SARS-CoV-2 RNA is generally detectable in upper and lower respiratory specimens during the acute phase of infection. Negative results do not preclude SARS-CoV-2 infection, do not rule out co-infections with other pathogens, and should not be used as the sole basis for treatment or other patient management decisions. Negative results must be combined with clinical observations, patient history, and epidemiological information. The expected result is Negative. Fact Sheet for Patients: HairSlick.no Fact Sheet for Healthcare Providers: quierodirigir.com This test is not yet approved or cleared by the [place] FDA and  has been authorized for detection and/or diagnosis of SARS-CoV-2 by FDA under an Emergency Use Authorization (EUA). This EUA will remain  in effect (meaning this test can be used) for the duration of the COVID-19 declaration under Section 56 4(b)(1) of the Act, 21 U.S.C. section 360bbb-3(b)(1), unless the authorization is terminated or revoked sooner. Performed at [facility], [address] [place], [place] [address]     Radiology No results found.  Assessment/Plan HTN (hypertension), benign blood pressure control important in reducing the progression of atherosclerotic disease. On appropriate oral medications.   Diabetes mellitus with stage 3 chronic kidney disease  (HCC) blood glucose control important in reducing the progression of atherosclerotic disease. Also, involved in wound healing. On appropriate medications.   Renal failure, chronic, stage 3 (moderate) Limit contrast with procedures requiring contrast and hydrate well if contrast is used.  No need for invasive procedures at this time.  Atherosclerosis of native arteries of the extremities with ulceration (HCC) ABIs today are noncompressible on the right but with good waveforms and digital pressures and 1.26 on the left with good waveforms as well.  Previous ulcer has healed although there is still a callus on the foot with some discomfort.  No role for arterial intervention at this time.  Plan to recheck in 6 months.    [name], MD  01/30/2020 10:32 AM    This note was created with Dragon medical transcription system.  Any errors from dictation are purely unintentional

## 2020-01-30 NOTE — Assessment & Plan Note (Signed)
ABIs today are noncompressible on the right but with good waveforms and digital pressures and 1.26 on the left with good waveforms as well.  Previous ulcer has healed although there is still a callus on the foot with some discomfort.  No role for arterial intervention at this time.  Plan to recheck in 6 months.

## 2020-02-26 ENCOUNTER — Other Ambulatory Visit
Admission: RE | Admit: 2020-02-26 | Discharge: 2020-02-26 | Disposition: A | Discharge status: Home / Self Care | Payer: Medicare Other | Source: Ambulatory Visit | Attending: Internal Medicine | Admitting: Internal Medicine

## 2020-02-26 ENCOUNTER — Other Ambulatory Visit: Payer: Self-pay

## 2020-02-26 DIAGNOSIS — Z01812 Encounter for preprocedural laboratory examination: Secondary | ICD-10-CM | POA: Insufficient documentation

## 2020-02-26 DIAGNOSIS — Z20822 Contact with and (suspected) exposure to covid-19: Secondary | ICD-10-CM | POA: Diagnosis not present

## 2020-02-26 LAB — SARS CORONAVIRUS 2 (TAT 6-24 HRS): SARS Coronavirus 2: NEGATIVE

## 2020-02-27 ENCOUNTER — Encounter: Payer: Self-pay | Admitting: Internal Medicine

## 2020-02-28 ENCOUNTER — Other Ambulatory Visit: Payer: Self-pay

## 2020-02-28 ENCOUNTER — Ambulatory Visit: Payer: Medicare Other | Admitting: Anesthesiology

## 2020-02-28 ENCOUNTER — Encounter: Payer: Self-pay | Admitting: Internal Medicine

## 2020-02-28 ENCOUNTER — Ambulatory Visit
Admission: RE | Admit: 2020-02-28 | Discharge: 2020-02-28 | Disposition: A | Discharge status: Home / Self Care | Payer: Medicare Other | Source: Ambulatory Visit | Attending: Internal Medicine | Admitting: Internal Medicine

## 2020-02-28 ENCOUNTER — Encounter
Admission: RE | Disposition: A | Discharge status: Home / Self Care | Payer: Self-pay | Source: Ambulatory Visit | Attending: Internal Medicine

## 2020-02-28 DIAGNOSIS — K219 Gastro-esophageal reflux disease without esophagitis: Secondary | ICD-10-CM | POA: Insufficient documentation

## 2020-02-28 DIAGNOSIS — I10 Essential (primary) hypertension: Secondary | ICD-10-CM | POA: Insufficient documentation

## 2020-02-28 DIAGNOSIS — R1310 Dysphagia, unspecified: Secondary | ICD-10-CM | POA: Diagnosis present

## 2020-02-28 DIAGNOSIS — Q398 Other congenital malformations of esophagus: Secondary | ICD-10-CM | POA: Diagnosis not present

## 2020-02-28 DIAGNOSIS — G4730 Sleep apnea, unspecified: Secondary | ICD-10-CM | POA: Diagnosis not present

## 2020-02-28 DIAGNOSIS — E785 Hyperlipidemia, unspecified: Secondary | ICD-10-CM | POA: Insufficient documentation

## 2020-02-28 DIAGNOSIS — E1151 Type 2 diabetes mellitus with diabetic peripheral angiopathy without gangrene: Secondary | ICD-10-CM | POA: Insufficient documentation

## 2020-02-28 DIAGNOSIS — Z87891 Personal history of nicotine dependence: Secondary | ICD-10-CM | POA: Diagnosis not present

## 2020-02-28 DIAGNOSIS — Z9889 Other specified postprocedural states: Secondary | ICD-10-CM | POA: Insufficient documentation

## 2020-02-28 DIAGNOSIS — Z955 Presence of coronary angioplasty implant and graft: Secondary | ICD-10-CM | POA: Insufficient documentation

## 2020-02-28 DIAGNOSIS — K209 Esophagitis, unspecified without bleeding: Secondary | ICD-10-CM | POA: Insufficient documentation

## 2020-02-28 DIAGNOSIS — K228 Other specified diseases of esophagus: Secondary | ICD-10-CM | POA: Diagnosis not present

## 2020-02-28 DIAGNOSIS — Z7984 Long term (current) use of oral hypoglycemic drugs: Secondary | ICD-10-CM | POA: Diagnosis not present

## 2020-02-28 DIAGNOSIS — I2510 Atherosclerotic heart disease of native coronary artery without angina pectoris: Secondary | ICD-10-CM | POA: Insufficient documentation

## 2020-02-28 DIAGNOSIS — K222 Esophageal obstruction: Secondary | ICD-10-CM | POA: Diagnosis not present

## 2020-02-28 DIAGNOSIS — Z79899 Other long term (current) drug therapy: Secondary | ICD-10-CM | POA: Diagnosis not present

## 2020-02-28 DIAGNOSIS — Z7902 Long term (current) use of antithrombotics/antiplatelets: Secondary | ICD-10-CM | POA: Diagnosis not present

## 2020-02-28 DIAGNOSIS — Z7982 Long term (current) use of aspirin: Secondary | ICD-10-CM | POA: Diagnosis not present

## 2020-02-28 HISTORY — PX: ESOPHAGOGASTRODUODENOSCOPY (EGD) WITH PROPOFOL: SHX5813

## 2020-02-28 LAB — GLUCOSE, CAPILLARY: Glucose-Capillary: 153 mg/dL — ABNORMAL HIGH (ref 70–99)

## 2020-02-28 SURGERY — ESOPHAGOGASTRODUODENOSCOPY (EGD) WITH PROPOFOL
Anesthesia: General

## 2020-02-28 MED ORDER — LIDOCAINE HCL (CARDIAC) PF 100 MG/5ML IV SOSY
PREFILLED_SYRINGE | INTRAVENOUS | Status: DC | PRN
  Administered 2020-02-28: 50 mg via INTRAVENOUS

## 2020-02-28 MED ORDER — EPHEDRINE 5 MG/ML INJ
INTRAVENOUS | Status: AC
  Filled 2020-02-28: qty 10

## 2020-02-28 MED ORDER — LIDOCAINE HCL URETHRAL/MUCOSAL 2 % EX GEL
CUTANEOUS | Status: AC
  Filled 2020-02-28: qty 5

## 2020-02-28 MED ORDER — SODIUM CHLORIDE 0.9 % IV SOLN: INTRAVENOUS | Status: DC

## 2020-02-28 MED ORDER — PROPOFOL 500 MG/50ML IV EMUL
INTRAVENOUS | Status: DC | PRN
  Administered 2020-02-28: 125 ug/kg/min via INTRAVENOUS

## 2020-02-28 MED ORDER — PROPOFOL 500 MG/50ML IV EMUL
INTRAVENOUS | Status: AC
  Filled 2020-02-28: qty 50

## 2020-02-28 MED ORDER — EPHEDRINE SULFATE 50 MG/ML IJ SOLN
INTRAMUSCULAR | Status: DC | PRN
  Administered 2020-02-28: 5 mg via INTRAVENOUS

## 2020-02-28 NOTE — Op Note (Signed)
[facility] Gastroenterology Patient Name: Bobby Warner Procedure Date: 02/28/2020 11:17 AM MRN: [id number] Account #: [id number] Date of Birth: 18-Sep-1941 Admit Type: Outpatient Age: 79 Room: [facility] ENDO ROOM 3 Gender: Male Note Status: Finalized Procedure:             Upper GI endoscopy Indications:           Dysphagia, Assessment following Nissen fundoplication Providers:             Boykin Nearing. [name] MD, MD Referring MD:          Marya Amsler. Dareen Piano MD, MD (Referring MD) Medicines:             Propofol per Anesthesia Complications:         No immediate complications. Estimated blood loss: None. Procedure:             Pre-Anesthesia Assessment:                        - The risks and benefits of the procedure and the                         sedation options and risks were discussed with the                         patient. All questions were answered and informed                         consent was obtained.                        - Patient identification and proposed procedure were                         verified prior to the procedure by the nurse. The                         procedure was verified in the procedure room.                        - ASA Grade Assessment: III - A patient with severe                         systemic disease.                        - After reviewing the risks and benefits, the patient                         was deemed in satisfactory condition to undergo the                         procedure.                        After obtaining informed consent, the endoscope was                         passed under direct vision. Throughout the procedure,  the patient's blood pressure, pulse, and oxygen                         saturations were monitored continuously. The Endoscope                         was introduced through the mouth, and advanced to the                         third part of duodenum. The upper GI  endoscopy was                         somewhat difficult due to unusual anatomy, extrinsic                         compression and narrowing. Successful completion of                         the procedure was aided by withdrawing and reinserting                         the scope. The patient tolerated the procedure well. Findings:      Non-severe esophagitis with no bleeding was found in the lower third of       the esophagus. Biopsies were taken with a cold forceps for histology.      A moderate-sized area of extrinsic compression was found in the distal       esophagus. A TTS dilator was passed through the scope. Dilation with an       18-19-20 mm balloon dilator was performed to 20 mm. The dilation site       was examined following endoscope reinsertion and showed no change.       Estimated blood loss: none.      The lumen of the lower third of the esophagus was moderately dilated.      Evidence of a Nissen fundoplication was found in the cardia. The wrap       appeared tight. This was traversed.      A large amount of food (residue) was found in the gastric fundus and in       the gastric body.      The examined duodenum was normal.      The exam was otherwise without abnormality.      Moderate tortuosity of the mid to distal esophagus was noted compatible       with a diagnosis of Presbyesophagus. Impression:            - Non-severe non-reflux esophagitis with no bleeding.                         Biopsied.                        - Extrinsic compression in the distal esophagus.                         Dilated.                        - Dilation in the lower third of the esophagus.                        -  A Nissen fundoplication was found. The wrap appears                         tight.                        - A large amount of food (residue) in the stomach.                        - Normal examined duodenum.                        - The examination was otherwise  normal. Recommendation:        - Patient has a contact number available for                         emergencies. The signs and symptoms of potential                         delayed complications were discussed with the patient.                         Return to normal activities tomorrow. Written                         discharge instructions were provided to the patient.                        - Continue present medications.                        - Gastroparesis diet.                        - Observe patient's clinical course.                        - Return to physician assistant in 6 weeks.                        - If dysphagia not improved, consider UGI series to                         evaluate for continued obstruction at the GE junction.                         Fundoplication may require surgical revision if                         dysphagia continues. Esophageal dilation is thought to                         be of minimal impact upon observation today.                        - The findings and recommendations were discussed with                         the patient. Procedure Code(s):     --- Professional ---  [id number], Esophagogastroduodenoscopy, flexible,                         transoral; with transendoscopic balloon dilation of                         esophagus (less than 30 mm diameter)                        43239, 59, Esophagogastroduodenoscopy, flexible,                         transoral; with biopsy, single or multiple Diagnosis Code(s):     --- Professional ---                        Y85.027, Other specified postprocedural states                        Z09, Encounter for follow-up examination after                         completed treatment for conditions other than                         malignant neoplasm                        R13.10, Dysphagia, unspecified                        K22.8, Other specified diseases of esophagus                         K22.2, Esophageal obstruction                        K20.80, Other esophagitis without bleeding CPT copyright 2019 [employer]. All rights reserved. The codes documented in this report are preliminary and upon coder review may  be revised to meet current compliance requirements. Efrain Sella MD, MD 02/28/2020 11:47:10 AM This report has been signed electronically. Number of Addenda: 0 Note Initiated On: 02/28/2020 11:17 AM Estimated Blood Loss:  Estimated blood loss: none. Estimated blood loss: none.      [facility]

## 2020-02-28 NOTE — Anesthesia Postprocedure Evaluation (Signed)
Anesthesia Post Note  Patient: Bobby Guest [name]  Procedure(s) Performed: ESOPHAGOGASTRODUODENOSCOPY (EGD) WITH PROPOFOL (N/A )  Patient location during evaluation: Endoscopy Anesthesia Type: General Level of consciousness: awake and alert Pain management: pain level controlled Vital Signs Assessment: post-procedure vital signs reviewed and stable Respiratory status: spontaneous breathing, nonlabored ventilation, respiratory function stable and patient connected to nasal cannula oxygen Cardiovascular status: blood pressure returned to baseline and stable Postop Assessment: no apparent nausea or vomiting Anesthetic complications: no     Last Vitals:  Vitals:   02/28/20 1156 02/28/20 1216  BP: 115/70 134/69  Pulse:    Resp: 20   Temp:    SpO2: 100%     Last Pain:  Vitals:   02/28/20 1216  TempSrc:   PainSc: 0-No pain                 Corinda Gubler

## 2020-02-28 NOTE — Transfer of Care (Signed)
Immediate Anesthesia Transfer of Care Note  Patient: Bobby Guest [name]  Procedure(s) Performed: ESOPHAGOGASTRODUODENOSCOPY (EGD) WITH PROPOFOL (N/A )  Patient Location: PACU  Anesthesia Type:General  Level of Consciousness: awake and sedated  Airway & Oxygen Therapy: Patient Spontanous Breathing and Patient connected to nasal cannula oxygen  Post-op Assessment: Report given to RN and Post -op Vital signs reviewed and stable  Post vital signs: Reviewed and stable  Last Vitals:  Vitals Value Taken Time  BP    Temp    Pulse    Resp    SpO2      Last Pain:  Vitals:   02/28/20 1017  TempSrc: Temporal  PainSc: 0-No pain         Complications: No apparent anesthesia complications

## 2020-02-28 NOTE — Anesthesia Preprocedure Evaluation (Addendum)
Anesthesia Evaluation  Patient identified by MRN, date of birth, ID band Patient awake    Reviewed: Allergy & Precautions, NPO status , Patient's Chart, lab work & pertinent test results  History of Anesthesia Complications Negative for: history of anesthetic complications  Airway Mallampati: II  TM Distance: >3 FB Neck ROM: Full    Dental  (+) Edentulous Upper, Edentulous Lower   Pulmonary sleep apnea , neg COPD, Patient abstained from smoking.Not current smoker, former smoker,    Pulmonary exam normal breath sounds clear to auscultation       Cardiovascular Exercise Tolerance: Good METShypertension, + CAD, + Cardiac Stents and + Peripheral Vascular Disease  (-) Past MI (-) dysrhythmias  Rhythm:Regular Rate:Normal - Systolic murmurs Stress echocardiogram 08/17/2016 revealed normal left ventricular function, with LVEF greater than 55%, without evidence for scar or ischemia.   Neuro/Psych negative neurological ROS  negative psych ROS   GI/Hepatic hiatal hernia, GERD  Medicated,(+)     (-) substance abuse  ,   Endo/Other  diabetes  Renal/GU negative Renal ROS     Musculoskeletal   Abdominal   Peds  Hematology   Anesthesia Other Findings Past Medical History: No date: Arthritis No date: Back pain No date: Coronary artery disease No date: Diabetes mellitus without complication (HCC) No date: GERD (gastroesophageal reflux disease) No date: History of hiatal hernia No date: Hyperlipidemia No date: Hypertension No date: Sleep apnea  Reproductive/Obstetrics                            Anesthesia Physical Anesthesia Plan  ASA: III  Anesthesia Plan: General   Post-op Pain Management:    Induction: Intravenous  PONV Risk Score and Plan: 2 and Ondansetron, Propofol infusion and TIVA  Airway Management Planned: Nasal Cannula  Additional Equipment: None  Intra-op Plan:    Post-operative Plan:   Informed Consent: I have reviewed the patients History and Physical, chart, labs and discussed the procedure including the risks, benefits and alternatives for the proposed anesthesia with the patient or authorized representative who has indicated his/her understanding and acceptance.     Dental advisory given  Plan Discussed with: CRNA and Surgeon  Anesthesia Plan Comments: (Discussed risks of anesthesia with patient, including possibility of difficulty with spontaneous ventilation under anesthesia necessitating airway intervention, PONV, and rare risks such as cardiac or respiratory or neurological events. Patient understands.)        Anesthesia Quick Evaluation

## 2020-02-29 LAB — SURGICAL PATHOLOGY

## 2020-03-06 NOTE — H&P (Signed)
Outpatient short stay form Pre-procedure 03/06/2020 2:15 PM Bobby Warner K. Bobby Warner, M.D.  Primary Physician:  None   Reason for visit:  Dysphagia  History of present illness:  79 y/o patient c/o dysphagia with hx of Nissen fundoplication. Patient denies weight loss or hemetemesis.   No current facility-administered medications for this encounter.  Current Outpatient Medications:  .  acetaminophen (TYLENOL) 500 MG tablet, Take 1,000 mg by mouth every 6 (six) hours as needed., Disp: , Rfl:  .  aspirin 81 MG tablet, Take 81 mg by mouth daily., Disp: , Rfl:  .  b complex vitamins capsule, Take 1 capsule by mouth daily., Disp: , Rfl:  .  cetirizine (ZYRTEC) 10 MG chewable tablet, Chew 10 mg by mouth daily., Disp: , Rfl:  .  glimepiride (AMARYL) 2 MG tablet, TAKE 1 TABLET BY MOUTH ONCE DAILY WITH BREAKFAST, Disp: , Rfl:  .  JARDIANCE 10 MG TABS tablet, TAKE 1 TABLET BY MOUTH ONCE DAILY WITH BREAKFAST, Disp: , Rfl:  .  losartan (COZAAR) 100 MG tablet, Take 100 mg by mouth daily., Disp: , Rfl:  .  metFORMIN (GLUCOPHAGE-XR) 500 MG 24 hr tablet, Take 500 mg by mouth daily with breakfast., Disp: , Rfl:  .  methocarbamol (ROBAXIN) 500 MG tablet, Take 500 mg by mouth daily. , Disp: , Rfl:  .  metoprolol succinate (TOPROL-XL) 100 MG 24 hr tablet, Take 100 mg by mouth daily. Take with or immediately following a meal., Disp: , Rfl:  .  omeprazole (PRILOSEC) 20 MG capsule, Take 20 mg by mouth daily., Disp: , Rfl:  .  tiZANidine (ZANAFLEX) 4 MG tablet, Take 4 mg by mouth 3 (three) times daily as needed., Disp: , Rfl:  .  vitamin B-12 (CYANOCOBALAMIN) 1000 MCG tablet, Take 1,000 mcg by mouth daily., Disp: , Rfl:  .  atorvastatin (LIPITOR) 40 MG tablet, Take 40 mg by mouth daily., Disp: , Rfl:  .  clopidogrel (PLAVIX) 75 MG tablet, Take 75 mg by mouth daily., Disp: , Rfl:  .  colchicine 0.6 MG tablet, Take 0.6 mg by mouth 2 (two) times daily as needed., Disp: , Rfl:  .  ketorolac (ACULAR) 0.4 % SOLN, 1 drop 4  (four) times daily., Disp: , Rfl:   No medications prior to admission.     Allergies  Allergen Reactions  . Nsaids   . Tolmetin      Past Medical History:  Diagnosis Date  . Arthritis   . Back pain   . Coronary artery disease   . Diabetes mellitus without complication (HCC)   . GERD (gastroesophageal reflux disease)   . History of hiatal hernia   . Hyperlipidemia   . Hypertension   . Sleep apnea     Review of systems:  Otherwise negative.    Physical Exam  Gen: Alert, oriented. Appears stated age.  HEENT: [place]/AT. PERRLA. Lungs: CTA, no wheezes. CV: RR nl S1, S2. Abd: soft, benign, no masses. BS+ Ext: No edema. Pulses 2+    Planned procedures: Proceed with EGD. The patient understands the nature of the planned procedure, indications, risks, alternatives and potential complications including but not limited to bleeding, infection, perforation, damage to internal organs and possible oversedation/side effects from anesthesia. The patient agrees and gives consent to proceed.  Please refer to procedure notes for findings, recommendations and patient disposition/instructions.     Bobby Warner K. Bobby Warner, M.D. Gastroenterology 03/06/2020  2:15 PM

## 2020-05-30 DIAGNOSIS — Z7901 Long term (current) use of anticoagulants: Secondary | ICD-10-CM | POA: Insufficient documentation

## 2020-05-30 DIAGNOSIS — Z9889 Other specified postprocedural states: Secondary | ICD-10-CM | POA: Insufficient documentation

## 2020-06-22 IMAGING — RF ESOPHAGUS/BARIUM SWALLOW/TABLET STUDY
10 of 14 series · 14 of 24 positions shown · non-contrast
Comparison: None.

CLINICAL DATA: Dysphagia, gastroesophageal reflux

EXAM:
ESOPHOGRAM / BARIUM SWALLOW / BARIUM TABLET STUDY
TECHNIQUE: Combined double contrast and single contrast examination performed
using effervescent crystals, thick barium liquid, and thin barium
liquid. The patient was observed with fluoroscopy swallowing a 13 mm
barium sulphate tablet.
FLUOROSCOPY TIME:  Fluoroscopy Time:  48 seconds
Radiation Exposure Index (if provided by the fluoroscopic device):
6.2 mGy
Number of Acquired Spot Images: 0

[Series 1: cp_standard · 0.25mm/px · 2 of 35 frames shown (1 of 10)]
[frame 6/35]
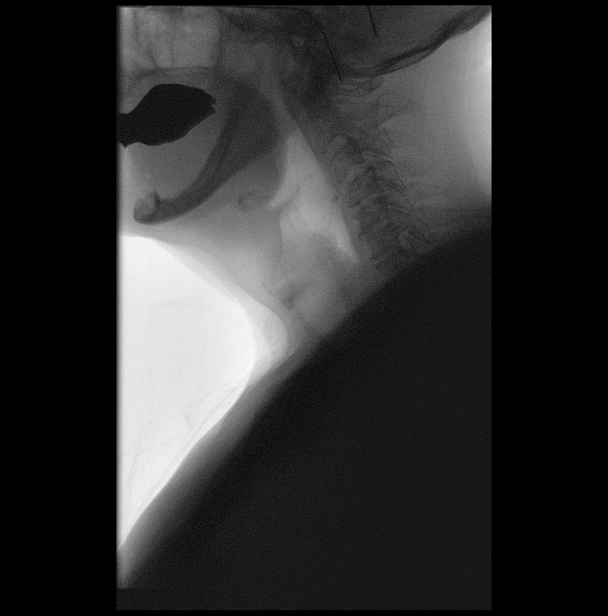
[frame 18/35]
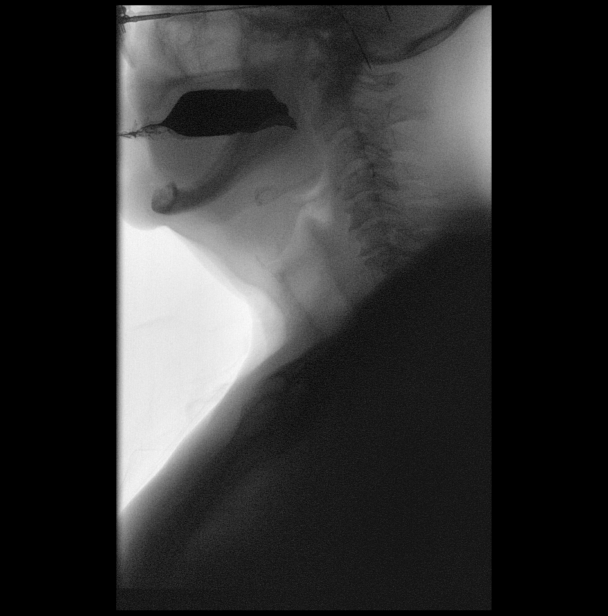

[Series 2: cp_standard · 0.25mm/px · 2 of 30 frames shown (2 of 10)]
[frame 16/30]
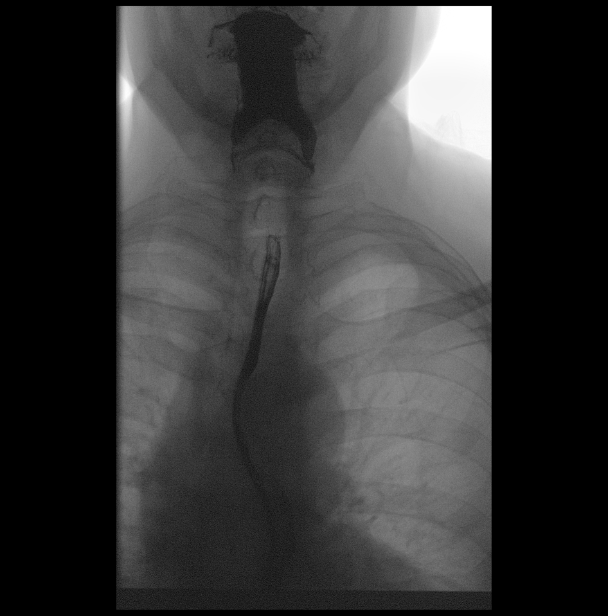
[frame 26/30]
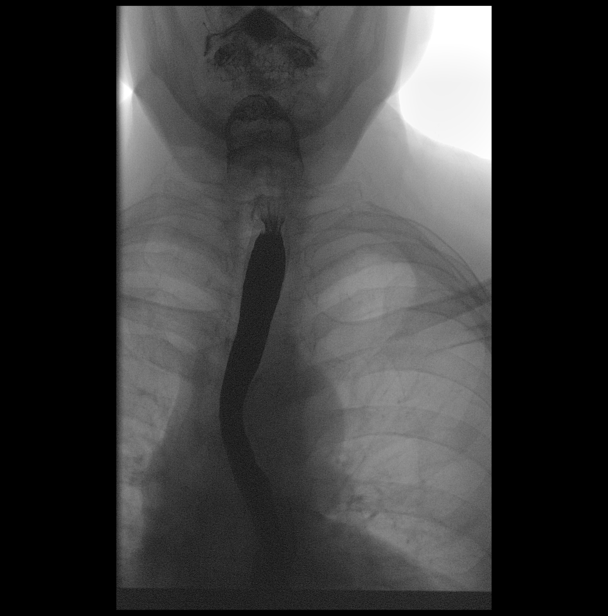

[Series 3: cp_standard · 0.25mm/px · 1 of 1 slices shown (3 of 10)]
[im 1/1]
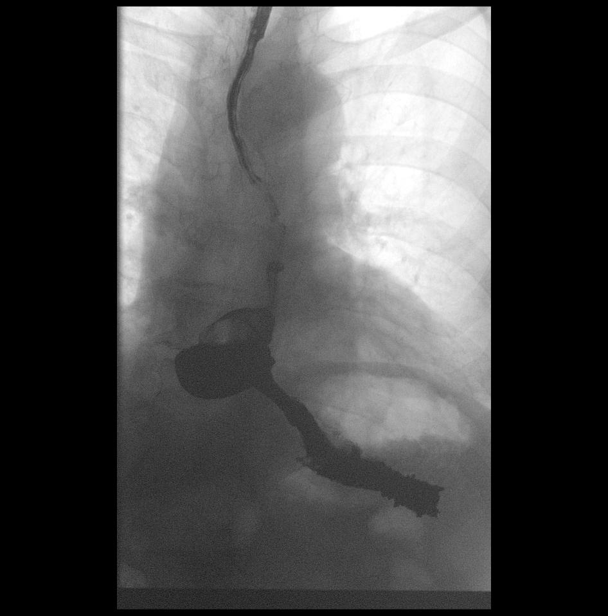

[Series 4: cp_standard · 0.25mm/px · 1 of 21 frames shown (4 of 10)]
[frame 11/21]
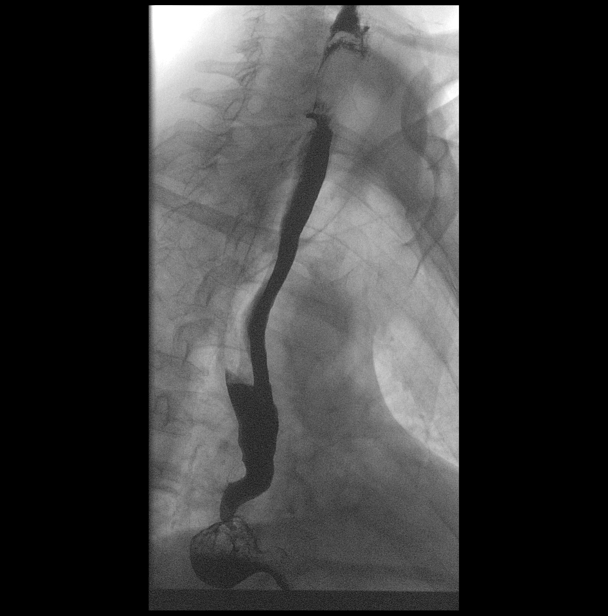

[Series 5: cp_standard · 0.25mm/px · 3 of 45 frames shown (5 of 10)]
[frame 7/45]
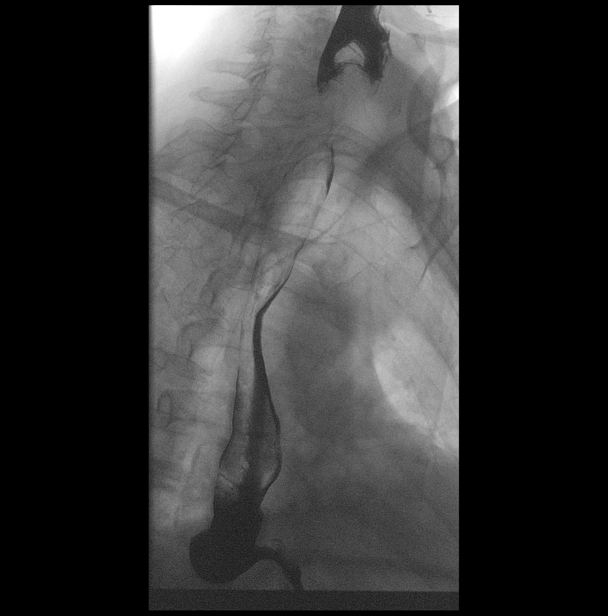
[frame 11/45]
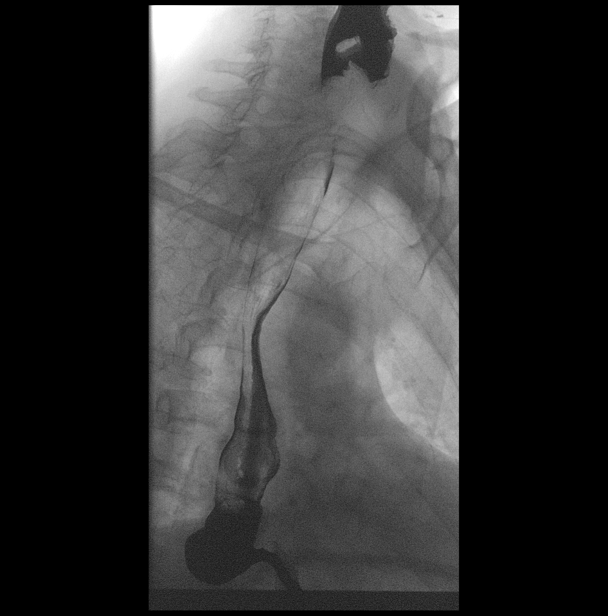
[frame 39/45]
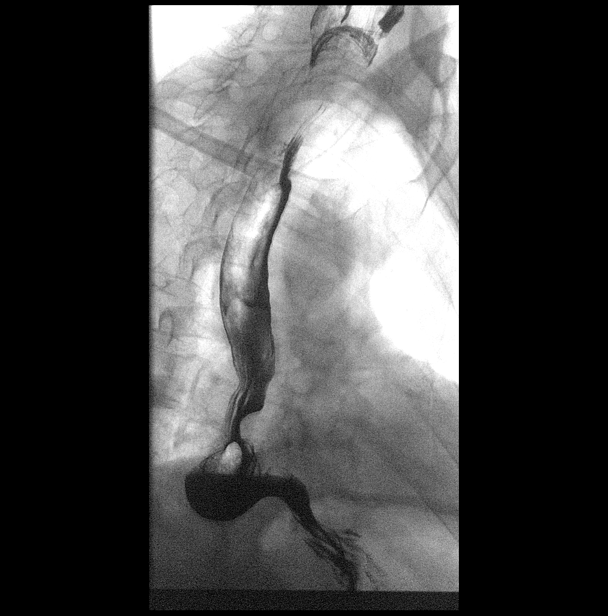

[Series 8: cp_standard · 0.25mm/px · 1 of 1 slices shown (6 of 10)]
[im 1/1]
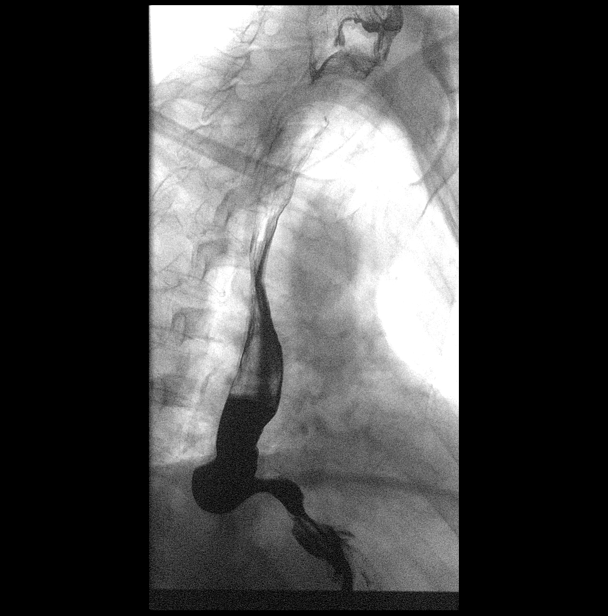

[Series 10: cp_standard · 0.28mm/px · 1 of 1 slices shown (7 of 10)]
[im 1/1]
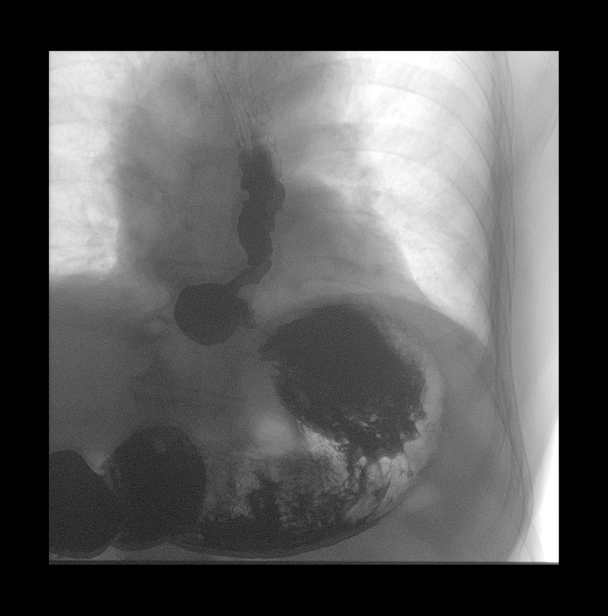

[Series 11: cp_standard · 0.27mm/px · 1 of 1 slices shown (8 of 10)]
[im 1/1]
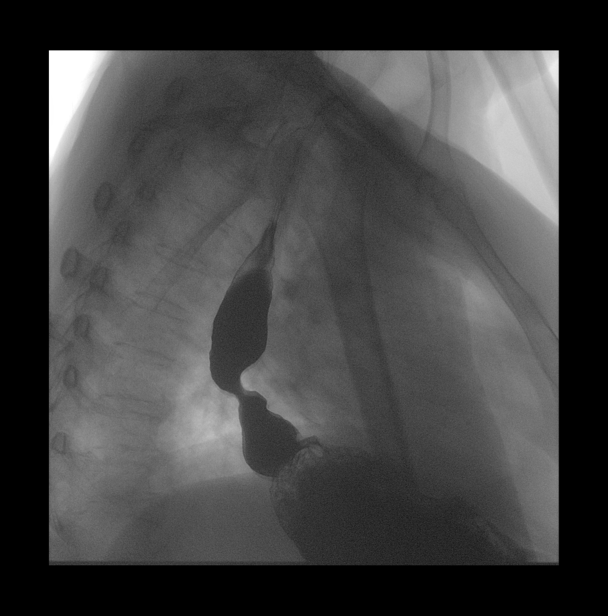

[Series 14: cp_standard · 0.28mm/px · 1 of 1 slices shown (9 of 10)]
[im 1/1]
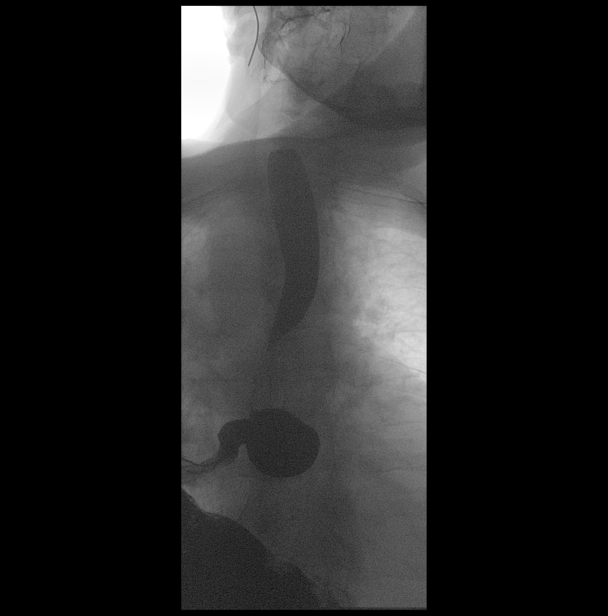

[Series 16: cp_standard · 0.26mm/px · 1 of 1 slices shown (10 of 10)]
[im 1/1]
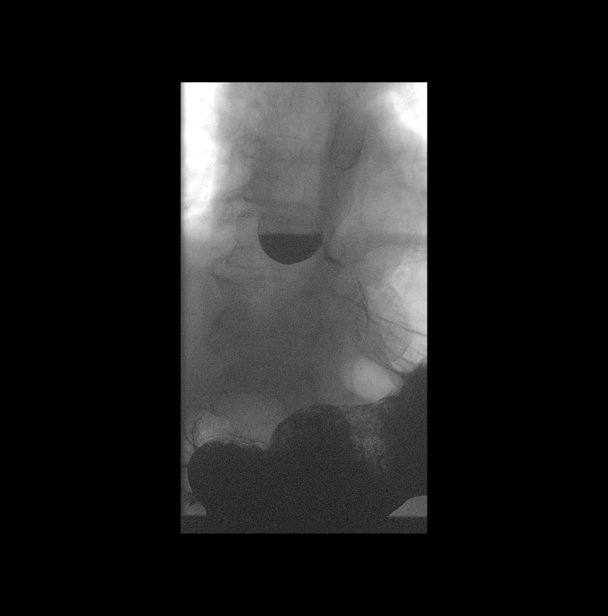

[14 of 24 positions shown; findings below may reference images not displayed]

FINDINGS: Normal pharyngeal anatomy and motility. Contrast flowed freely
through the esophagus without evidence of a stricture or mass.
Nnormal esophageal mucosa without evidence of irregularity or
ulceration. Tertiary contractions of the distal third of the
esophagus as can be seen with esophageal spasm. Moderate
gastroesophageal reflux. Small hiatal hernia.

At the end of the examination a 13 mm barium tablet was administered
which transited through the esophagus and esophagogastric junction
without delay.
IMPRESSION: 1. Small hiatal hernia.
2. Moderate gastroesophageal reflux.
3. Tertiary contractions of the distal third of the esophagus as can
be seen with mild esophageal spasm.
4. No esophageal stricture.

## 2020-08-06 ENCOUNTER — Encounter (INDEPENDENT_AMBULATORY_CARE_PROVIDER_SITE_OTHER): Payer: Self-pay | Admitting: Vascular Surgery

## 2020-08-06 ENCOUNTER — Ambulatory Visit (INDEPENDENT_AMBULATORY_CARE_PROVIDER_SITE_OTHER): Payer: Medicare Other

## 2020-08-06 ENCOUNTER — Other Ambulatory Visit: Payer: Self-pay

## 2020-08-06 ENCOUNTER — Ambulatory Visit (INDEPENDENT_AMBULATORY_CARE_PROVIDER_SITE_OTHER): Payer: Medicare Other | Admitting: Vascular Surgery

## 2020-08-06 VITALS — BP 142/71 | HR 61 | Resp 16 | Wt 180.0 lb

## 2020-08-06 DIAGNOSIS — N183 Chronic kidney disease, stage 3 unspecified: Secondary | ICD-10-CM | POA: Diagnosis not present

## 2020-08-06 DIAGNOSIS — E1122 Type 2 diabetes mellitus with diabetic chronic kidney disease: Secondary | ICD-10-CM

## 2020-08-06 DIAGNOSIS — I10 Essential (primary) hypertension: Secondary | ICD-10-CM | POA: Diagnosis not present

## 2020-08-06 DIAGNOSIS — I7025 Atherosclerosis of native arteries of other extremities with ulceration: Secondary | ICD-10-CM | POA: Diagnosis not present

## 2020-08-06 NOTE — Assessment & Plan Note (Signed)
ABIs today remain noncompressible/falsely elevated from medial calcification.  At the ankle, there are fairly strong monophasic to biphasic waveforms present.  His digital pressures have dropped.  However, with wound healing, and no pain or infection at this point, I would not recommend any intervention for the small vessel disease.  He will continue his current medical regimen.  We will plan to see him back in 6 months with noninvasive studies.  He will continue to follow with his podiatrist.

## 2020-08-06 NOTE — Patient Instructions (Signed)
Peripheral Vascular Disease  Peripheral vascular disease (PVD) is a disease of the blood vessels that are not part of your heart and brain. A simple term for PVD is poor circulation. In most cases, PVD narrows the blood vessels that carry blood from your heart to the rest of your body. This can reduce the supply of blood to your arms, legs, and internal organs, like your stomach or kidneys. However, PVD most often affects a person's lower legs and feet. Without treatment, PVD tends to get worse. PVD can also lead to acute ischemic limb. This is when an arm or leg suddenly cannot get enough blood. This is a medical emergency. Follow these instructions at home: Lifestyle  Do not use any products that contain nicotine or tobacco, such as cigarettes and e-cigarettes. If you need help quitting, ask your doctor.  Lose weight if you are overweight. Or, stay at a healthy weight as told by your doctor.  Eat a diet that is low in fat and cholesterol. If you need help, ask your doctor.  Exercise regularly. Ask your doctor for activities that are right for you. General instructions  Take over-the-counter and prescription medicines only as told by your doctor.  Take good care of your feet: ? Wear comfortable shoes that fit well. ? Check your feet often for any cuts or sores.  Keep all follow-up visits as told by your doctor This is important. Contact a doctor if:  You have cramps in your legs when you walk.  You have leg pain when you are at rest.  You have coldness in a leg or foot.  Your skin changes.  You are unable to get or have an erection (erectile dysfunction).  You have cuts or sores on your feet that do not heal. Get help right away if:  Your arm or leg turns cold, numb, and blue.  Your arms or legs become red, warm, swollen, painful, or numb.  You have chest pain.  You have trouble breathing.  You suddenly have weakness in your face, arm, or leg.  You become very  confused or you cannot speak.  You suddenly have a very bad headache.  You suddenly cannot see. Summary  Peripheral vascular disease (PVD) is a disease of the blood vessels.  A simple term for PVD is poor circulation. Without treatment, PVD tends to get worse.  Treatment may include exercise, low fat and low cholesterol diet, and quitting smoking. This information is not intended to replace advice given to you by your health care provider. Make sure you discuss any questions you have with your health care provider. Document Revised: 10/08/2017 Document Reviewed: 12/03/2016 Elsevier Patient Education  2020 [employer]  

## 2020-08-06 NOTE — Progress Notes (Signed)
MRN : [id number]  Bobby CYBULSKI [name] is a 79 y.o. (10/27/1941) male who presents with chief complaint of  Chief Complaint  Patient presents with   Follow-up    ultrasound follow up  .  History of Present Illness: Patient returns today in follow up of his peripheral arterial disease.  He has been following with podiatry, and his ulceration which she had previously and was the reason for his revascularization has healed.  He has no rest pain.  No infection or current ulceration.  He is scheduled to see his podiatrist in a few weeks for follow-up.  No new complaints today. ABIs today remain noncompressible/falsely elevated from medial calcification.  At the ankle, there are fairly strong monophasic to biphasic waveforms present.  His digital pressures have dropped.  Current Outpatient Medications  Medication Sig Dispense Refill   acetaminophen (TYLENOL) 500 MG tablet Take 1,000 mg by mouth every 6 (six) hours as needed.     aspirin 81 MG tablet Take 81 mg by mouth daily.     atorvastatin (LIPITOR) 40 MG tablet Take 40 mg by mouth daily.     b complex vitamins capsule Take 1 capsule by mouth daily.     cetirizine (ZYRTEC) 10 MG chewable tablet Chew 10 mg by mouth daily.     clopidogrel (PLAVIX) 75 MG tablet Take 75 mg by mouth daily.     colchicine 0.6 MG tablet Take 0.6 mg by mouth 2 (two) times daily as needed.     glimepiride (AMARYL) 2 MG tablet TAKE 1 TABLET BY MOUTH ONCE DAILY WITH BREAKFAST     JARDIANCE 10 MG TABS tablet TAKE 1 TABLET BY MOUTH ONCE DAILY WITH BREAKFAST     ketorolac (ACULAR) 0.4 % SOLN 1 drop 4 (four) times daily.     losartan (COZAAR) 100 MG tablet Take 100 mg by mouth daily.     metFORMIN (GLUCOPHAGE-XR) 500 MG 24 hr tablet Take 500 mg by mouth daily with breakfast.     methocarbamol (ROBAXIN) 500 MG tablet Take 500 mg by mouth daily.      metoprolol succinate (TOPROL-XL) 100 MG 24 hr tablet Take 100 mg by mouth daily. Take with or immediately  following a meal.     omeprazole (PRILOSEC) 20 MG capsule Take 20 mg by mouth daily.     tiZANidine (ZANAFLEX) 4 MG tablet Take 4 mg by mouth 3 (three) times daily as needed.     vitamin B-12 (CYANOCOBALAMIN) 1000 MCG tablet Take 1,000 mcg by mouth daily.     No current facility-administered medications for this visit.    Past Medical History:  Diagnosis Date   Arthritis    Back pain    Coronary artery disease    Diabetes mellitus without complication (HCC)    GERD (gastroesophageal reflux disease)    History of hiatal hernia    Hyperlipidemia    Hypertension    Sleep apnea     Past Surgical History:  Procedure Laterality Date   APPENDECTOMY     CARDIAC CATHETERIZATION     CHOLECYSTECTOMY     COLONOSCOPY WITH ESOPHAGOGASTRODUODENOSCOPY (EGD)     COLONOSCOPY WITH PROPOFOL N/A 03/16/2016   Procedure: COLONOSCOPY WITH PROPOFOL;  Surgeon: [name], MD;  Location: [facility] ENDOSCOPY;  Service: Endoscopy;  Laterality: N/A;   ESOPHAGOGASTRODUODENOSCOPY (EGD) WITH PROPOFOL N/A 02/28/2020   Procedure: ESOPHAGOGASTRODUODENOSCOPY (EGD) WITH PROPOFOL;  Surgeon: Toledo, Boykin Nearing, MD;  Location: [facility] ENDOSCOPY;  Service: Gastroenterology;  Laterality: N/A;  EYE SURGERY     HERNIA REPAIR     LOWER EXTREMITY ANGIOGRAPHY Left 10/09/2019   Procedure: LOWER EXTREMITY ANGIOGRAPHY;  Surgeon: Annice Needy, MD;  Location: [facility] INVASIVE CV LAB;  Service: Cardiovascular;  Laterality: Left;   NISSEN FUNDOPLICATION     testicular vein surgery     TONSILLECTOMY     TOTAL KNEE ARTHROPLASTY       Social History   Tobacco Use   Smoking status: Former Smoker   Smokeless tobacco: Never Used  Building services engineer Use: Never used  Substance Use Topics   Alcohol use: Yes    Alcohol/week: 1.0 standard drink    Types: 1 Cans of beer per week    Comment: occassional    Drug use: No      Family History  Problem Relation Age of Onset   Varicose Veins Neg Hx     Vision loss Neg Hx    Stroke Neg Hx      Allergies  Allergen Reactions   Nsaids    Tolmetin     REVIEW OF SYSTEMS (Negative unless checked)  Constitutional: [] ?Weight loss  [] ?Fever  [] ?Chills Cardiac: [] ?Chest pain   [] ?Chest pressure   [] ?Palpitations   [] ?Shortness of breath when laying flat   [] ?Shortness of breath at rest   [] ?Shortness of breath with exertion. Vascular:  [] ?Pain in legs with walking   [] ?Pain in legs at rest   [] ?Pain in legs when laying flat   [] ?Claudication   [] ?Pain in feet when walking  [] ?Pain in feet at rest  [] ?Pain in feet when laying flat   [] ?History of DVT   [] ?Phlebitis   [] ?Swelling in legs   [] ?Varicose veins   [] ?Non-healing ulcers Pulmonary:   [] ?Uses home oxygen   [] ?Productive cough   [] ?Hemoptysis   [] ?Wheeze  [] ?COPD   [] ?Asthma Neurologic:  [] ?Dizziness  [] ?Blackouts   [] ?Seizures   [] ?History of stroke   [] ?History of TIA  [] ?Aphasia   [] ?Temporary blindness   [] ?Dysphagia   [] ?Weakness or numbness in arms   [] ?Weakness or numbness in legs Musculoskeletal:  [x] ?Arthritis   [] ?Joint swelling   [x] ?Joint pain   [x] ?Low back pain Hematologic:  [] ?Easy bruising  [] ?Easy bleeding   [] ?Hypercoagulable state   [] ?Anemic   Gastrointestinal:  [] ?Blood in stool   [] ?Vomiting blood  [x] ?Gastroesophageal reflux/heartburn   [] ?Abdominal pain Genitourinary:  [] ?Chronic kidney disease   [] ?Difficult urination  [] ?Frequent urination  [] ?Burning with urination   [] ?Hematuria Skin:  [] ?Rashes   [] ?Ulcers   [] ?Wounds Psychological:  [] ?History of anxiety   [] ? History of major depression.  Physical Examination  BP (!) 142/71 (BP Location: Right Arm)    Pulse 61    Resp 16    Wt 180 lb (81.6 kg)    BMI 25.83 kg/m  Gen:  WD/WN, NAD Head: Bobby Warner/AT, No temporalis wasting. Ear/Nose/Throat: Hearing somewhat decreased, nares w/o erythema or drainage Eyes: Conjunctiva clear. Sclera non-icteric Neck: Supple.  Trachea midline Pulmonary:  Good air movement, no use  of accessory muscles.  Cardiac: RRR, no JVD Vascular:  Vessel Right Left  Radial Palpable Palpable                          PT Palpable Palpable  DP Palpable Palpable   Musculoskeletal: M/S 5/5 throughout.  No deformity or atrophy. No edema. Neurologic: Sensation grossly intact in extremities.  Symmetrical.  Speech is fluent.  Psychiatric: Judgment intact, Mood &  affect appropriate for pt's clinical situation. Dermatologic: No rashes or ulcers noted.  No cellulitis or open wounds.       Labs No results found for this or any previous visit (from the past 2160 hour(s)).  Radiology No results found.  Assessment/Plan HTN (hypertension), benign blood pressure control important in reducing the progression of atherosclerotic disease. On appropriate oral medications.   Diabetes mellitus with stage 3 chronic kidney disease (HCC) blood glucose control important in reducing the progression of atherosclerotic disease. Also, involved in wound healing. On appropriate medications.   Renal failure, chronic, stage 3 (moderate) Limit contrast with procedures requiring contrast and hydrate well if contrast is used.  No need for invasive procedures at this time.  Atherosclerosis of native arteries of the extremities with ulceration (HCC) ABIs today remain noncompressible/falsely elevated from medial calcification.  At the ankle, there are fairly strong monophasic to biphasic waveforms present.  His digital pressures have dropped.  However, with wound healing, and no pain or infection at this point, I would not recommend any intervention for the small vessel disease.  He will continue his current medical regimen.  We will plan to see him back in 6 months with noninvasive studies.  He will continue to follow with his podiatrist.    [name], MD  08/06/2020 11:04 AM    This note was created with Dragon medical transcription system.  Any errors from dictation are purely  unintentional

## 2021-02-03 ENCOUNTER — Encounter (INDEPENDENT_AMBULATORY_CARE_PROVIDER_SITE_OTHER): Payer: Medicare Other

## 2021-02-03 ENCOUNTER — Ambulatory Visit (INDEPENDENT_AMBULATORY_CARE_PROVIDER_SITE_OTHER): Payer: Medicare Other | Admitting: Nurse Practitioner

## 2021-02-04 ENCOUNTER — Ambulatory Visit (INDEPENDENT_AMBULATORY_CARE_PROVIDER_SITE_OTHER): Payer: Medicare Other

## 2021-02-04 ENCOUNTER — Ambulatory Visit (INDEPENDENT_AMBULATORY_CARE_PROVIDER_SITE_OTHER): Payer: Medicare Other | Admitting: Nurse Practitioner

## 2021-02-04 ENCOUNTER — Other Ambulatory Visit: Payer: Self-pay

## 2021-02-04 ENCOUNTER — Telehealth (INDEPENDENT_AMBULATORY_CARE_PROVIDER_SITE_OTHER): Payer: Self-pay

## 2021-02-04 ENCOUNTER — Encounter (INDEPENDENT_AMBULATORY_CARE_PROVIDER_SITE_OTHER): Payer: Self-pay | Admitting: Nurse Practitioner

## 2021-02-04 VITALS — BP 136/73 | HR 67 | Ht 69.0 in | Wt 174.0 lb

## 2021-02-04 DIAGNOSIS — I10 Essential (primary) hypertension: Secondary | ICD-10-CM | POA: Diagnosis not present

## 2021-02-04 DIAGNOSIS — I7025 Atherosclerosis of native arteries of other extremities with ulceration: Secondary | ICD-10-CM

## 2021-02-04 DIAGNOSIS — N183 Chronic kidney disease, stage 3 unspecified: Secondary | ICD-10-CM

## 2021-02-04 DIAGNOSIS — E1122 Type 2 diabetes mellitus with diabetic chronic kidney disease: Secondary | ICD-10-CM | POA: Diagnosis not present

## 2021-02-04 DIAGNOSIS — E785 Hyperlipidemia, unspecified: Secondary | ICD-10-CM | POA: Insufficient documentation

## 2021-02-04 MED ORDER — TRAMADOL HCL 50 MG PO TABS: 50.0000 mg | ORAL_TABLET | Freq: Four times a day (QID) | ORAL | 0 refills | Status: DC | PRN

## 2021-02-04 MED ORDER — DOXYCYCLINE HYCLATE 100 MG PO CAPS: 100.0000 mg | ORAL_CAPSULE | Freq: Two times a day (BID) | ORAL | 0 refills | Status: DC

## 2021-02-04 NOTE — Telephone Encounter (Signed)
I attempted to contact the patient and per the person that answered the phone, I was told they were not there and would call when they returned.

## 2021-02-04 NOTE — Progress Notes (Signed)
Subjective:    Patient ID: Bobby Harps., male    DOB: [birth year], 80 y.o.   MRN: [id number] No chief complaint on file.   The patient returns to the office for followup and review of the noninvasive studies. There has been a significant deterioration in the lower extremity symptoms.  The patient notes interval shortening of their claudication distance and development of mild rest pain symptoms. No new ulcers or wounds have occurred since the last visit.  There have been no significant changes to the patient's overall health care.  The patient denies amaurosis fugax or recent TIA symptoms. There are no recent neurological changes noted. The patient denies history of DVT, PE or superficial thrombophlebitis. The patient denies recent episodes of angina or shortness of breath.   ABI's Rt= Bobby Warner and Lt= Bobby Warner (previous ABI's Rt= Bobby Warner and Lt= Bobby Warner) Duplex US of the lower extremity arterial system shows monophasic waveforms bilaterally with slightly dampened toe waveforms on the right.  The patient has flat waveforms on the left lower extremity   Review of Systems  Cardiovascular:       Claudication rest pain  All other systems reviewed and are negative.      Objective:   Physical Exam Vitals reviewed.  Cardiovascular:     Rate and Rhythm: Normal rate.     Pulses:          Dorsalis pedis pulses are detected w/ Doppler on the right side and detected w/ Doppler on the left side.       Posterior tibial pulses are detected w/ Doppler on the right side and detected w/ Doppler on the left side.  Pulmonary:     Effort: Pulmonary effort is normal.  Neurological:     Mental Status: He is alert and oriented to person, place, and time.  Psychiatric:        Mood and Affect: Mood normal.        Behavior: Behavior normal.        Thought Content: Thought content normal.        Judgment: Judgment normal.     There were no vitals taken for this visit.  Past Medical History:  Diagnosis Date   . Arthritis   . Back pain   . Coronary artery disease   . Diabetes mellitus without complication (HCC)   . GERD (gastroesophageal reflux disease)   . History of hiatal hernia   . Hyperlipidemia   . Hypertension   . Sleep apnea     Social History   Socioeconomic History  . Marital status: Married    Spouse name: Bobby Warner  . Number of children: 1  . Years of education: Not on file  . Highest education level: Not on file  Occupational History  . Not on file  Tobacco Use  . Smoking status: Former Games developer  . Smokeless tobacco: Never Used  Vaping Use  . Vaping Use: Never used  Substance and Sexual Activity  . Alcohol use: Yes    Alcohol/week: 1.0 standard drink    Types: 1 Cans of beer per week    Comment: occassional   . Drug use: No  . Sexual activity: Not on file  Other Topics Concern  . Not on file  Social History Narrative  . Not on file   Social Determinants of Health   Financial Resource Strain: Not on file  Food Insecurity: Not on file  Transportation Needs: Not on file  Physical Activity: Not on file  Stress: Not on file  Social Connections: Not on file  Intimate Partner Violence: Not on file    Past Surgical History:  Procedure Laterality Date  . APPENDECTOMY    . CARDIAC CATHETERIZATION    . CHOLECYSTECTOMY    . COLONOSCOPY WITH ESOPHAGOGASTRODUODENOSCOPY (EGD)    . COLONOSCOPY WITH PROPOFOL N/A 03/16/2016   Procedure: COLONOSCOPY WITH PROPOFOL;  Surgeon: [name], MD;  Location: [facility] ENDOSCOPY;  Service: Endoscopy;  Laterality: N/A;  . ESOPHAGOGASTRODUODENOSCOPY (EGD) WITH PROPOFOL N/A 02/28/2020   Procedure: ESOPHAGOGASTRODUODENOSCOPY (EGD) WITH PROPOFOL;  Surgeon: Toledo, Boykin Nearing, MD;  Location: [facility] ENDOSCOPY;  Service: Gastroenterology;  Laterality: N/A;  . EYE SURGERY    . HERNIA REPAIR    . LOWER EXTREMITY ANGIOGRAPHY Left 10/09/2019   Procedure: LOWER EXTREMITY ANGIOGRAPHY;  Surgeon: Annice Needy, MD;  Location: [facility] INVASIVE CV  LAB;  Service: Cardiovascular;  Laterality: Left;  . NISSEN FUNDOPLICATION    . testicular vein surgery    . TONSILLECTOMY    . TOTAL KNEE ARTHROPLASTY      Family History  Problem Relation Age of Onset  . Varicose Veins Neg Hx   . Vision loss Neg Hx   . Stroke Neg Hx     Allergies  Allergen Reactions  . Nsaids   . Tolmetin     CBC Latest Ref Rng & Units 04/18/2013 06/20/2012 06/20/2012  WBC 3.8 - 10.6 x10 3/mm 3 4.2 7.0 6.7  Hemoglobin 13.0 - 18.0 g/dL 94.8 54.6 27.0  Hematocrit 40.0 - 52.0 % 38.5(L) 38.8(L) 40.2  Platelets 150 - 440 x10 3/mm 3 162 112(L) 137(L)      CMP     Component Value Date/Time   NA 141 04/18/2013 0907   K 3.7 04/18/2013 0907   CL 105 04/18/2013 0907   CO2 30 04/18/2013 0907   GLUCOSE 159 (H) 04/18/2013 0907   BUN 17 10/09/2019 0939   BUN 8 04/18/2013 0907   CREATININE 1.38 (H) 10/09/2019 0939   CREATININE 1.08 04/18/2013 0907   CALCIUM 8.6 04/18/2013 0907   PROT 7.3 04/18/2013 0907   ALBUMIN 3.9 04/18/2013 0907   AST 24 04/18/2013 0907   ALT 29 04/18/2013 0907   ALKPHOS 54 04/18/2013 0907   BILITOT 0.6 04/18/2013 0907   GFRNONAA 48 (L) 10/09/2019 0939   GFRNONAA >60 04/18/2013 0907   GFRAA 56 (L) 10/09/2019 0939   GFRAA >60 04/18/2013 0907     No results found.     Assessment & Plan:   1. Atherosclerosis of native arteries of the extremities with ulceration (HCC) Recommend:  The patient has evidence of severe atherosclerotic changes of both lower extremities with rest pain that is associated with preulcerative changes and impending tissue loss of the foot.  This represents a limb threatening ischemia and places the patient at the risk for limb loss.  Patient should undergo angiography of the lower extremities with the hope for intervention for limb salvage.  The risks and benefits as well as the alternative therapies was discussed in detail with the patient.  All questions were answered.  Patient agrees to proceed with  angiography.  The patient will follow up with me in the office after the procedure.    2. HTN (hypertension), benign Continue antihypertensive medications as already ordered, these medications have been reviewed and there are no changes at this time.   3. Diabetes mellitus with stage 3 chronic kidney disease (HCC) Continue hypoglycemic medications as already ordered, these medications have been reviewed  and there are no changes at this time.  Hgb A1C to be monitored as already arranged by primary service    Current Outpatient Medications on File Prior to Visit  Medication Sig Dispense Refill  . acetaminophen (TYLENOL) 500 MG tablet Take 1,000 mg by mouth every 6 (six) hours as needed.    Marland Kitchen aspirin 81 MG tablet Take 81 mg by mouth daily.    Marland Kitchen atorvastatin (LIPITOR) 40 MG tablet Take 40 mg by mouth daily.    Marland Kitchen b complex vitamins capsule Take 1 capsule by mouth daily.    . cetirizine (ZYRTEC) 10 MG chewable tablet Chew 10 mg by mouth daily.    . clopidogrel (PLAVIX) 75 MG tablet Take 75 mg by mouth daily.    . colchicine 0.6 MG tablet Take 0.6 mg by mouth 2 (two) times daily as needed.    Marland Kitchen glimepiride (AMARYL) 2 MG tablet TAKE 1 TABLET BY MOUTH ONCE DAILY WITH BREAKFAST    . JARDIANCE 10 MG TABS tablet TAKE 1 TABLET BY MOUTH ONCE DAILY WITH BREAKFAST    . ketorolac (ACULAR) 0.4 % SOLN 1 drop 4 (four) times daily.    Marland Kitchen losartan (COZAAR) 100 MG tablet Take 100 mg by mouth daily.    . metFORMIN (GLUCOPHAGE-XR) 500 MG 24 hr tablet Take 500 mg by mouth daily with breakfast.    . methocarbamol (ROBAXIN) 500 MG tablet Take 500 mg by mouth daily.     . metoprolol succinate (TOPROL-XL) 100 MG 24 hr tablet Take 100 mg by mouth daily. Take with or immediately following a meal.    . omeprazole (PRILOSEC) 20 MG capsule Take 20 mg by mouth daily.    Marland Kitchen tiZANidine (ZANAFLEX) 4 MG tablet Take 4 mg by mouth 3 (three) times daily as needed.    . vitamin B-12 (CYANOCOBALAMIN) 1000 MCG tablet Take 1,000  mcg by mouth daily.     No current facility-administered medications on file prior to visit.    There are no Patient Instructions on file for this visit. No follow-ups on file.   Georgiana Spinner, NP

## 2021-02-04 NOTE — H&P (View-Only) (Signed)
Subjective:    Patient ID: Bobby Warner., male    DOB: [birth year], 80 y.o.   MRN: [id number] No chief complaint on file.   The patient returns to the office for followup and review of the noninvasive studies. There has been a significant deterioration in the lower extremity symptoms.  The patient notes interval shortening of their claudication distance and development of mild rest pain symptoms. No new ulcers or wounds have occurred since the last visit.  There have been no significant changes to the patient's overall health care.  The patient denies amaurosis fugax or recent TIA symptoms. There are no recent neurological changes noted. The patient denies history of DVT, PE or superficial thrombophlebitis. The patient denies recent episodes of angina or shortness of breath.   ABI's Rt= [name] and Lt= [name] (previous ABI's Rt= [name] and Lt= [name]) Duplex US of the lower extremity arterial system shows monophasic waveforms bilaterally with slightly dampened toe waveforms on the right.  The patient has flat waveforms on the left lower extremity   Review of Systems  Cardiovascular:       Claudication rest pain  All other systems reviewed and are negative.      Objective:   Physical Exam Vitals reviewed.  Cardiovascular:     Rate and Rhythm: Normal rate.     Pulses:          Dorsalis pedis pulses are detected w/ Doppler on the right side and detected w/ Doppler on the left side.       Posterior tibial pulses are detected w/ Doppler on the right side and detected w/ Doppler on the left side.  Pulmonary:     Effort: Pulmonary effort is normal.  Neurological:     Mental Status: He is alert and oriented to person, place, and time.  Psychiatric:        Mood and Affect: Mood normal.        Behavior: Behavior normal.        Thought Content: Thought content normal.        Judgment: Judgment normal.     There were no vitals taken for this visit.  Past Medical History:  Diagnosis Date   . Arthritis   . Back pain   . Coronary artery disease   . Diabetes mellitus without complication (HCC)   . GERD (gastroesophageal reflux disease)   . History of hiatal hernia   . Hyperlipidemia   . Hypertension   . Sleep apnea     Social History   Socioeconomic History  . Marital status: Married    Spouse name: [name]  . Number of children: 1  . Years of education: Not on file  . Highest education level: Not on file  Occupational History  . Not on file  Tobacco Use  . Smoking status: Former Games developer  . Smokeless tobacco: Never Used  Vaping Use  . Vaping Use: Never used  Substance and Sexual Activity  . Alcohol use: Yes    Alcohol/week: 1.0 standard drink    Types: 1 Cans of beer per week    Comment: occassional   . Drug use: No  . Sexual activity: Not on file  Other Topics Concern  . Not on file  Social History Narrative  . Not on file   Social Determinants of Health   Financial Resource Strain: Not on file  Food Insecurity: Not on file  Transportation Needs: Not on file  Physical Activity: Not on file  Stress: Not on file  Social Connections: Not on file  Intimate Partner Violence: Not on file    Past Surgical History:  Procedure Laterality Date  . APPENDECTOMY    . CARDIAC CATHETERIZATION    . CHOLECYSTECTOMY    . COLONOSCOPY WITH ESOPHAGOGASTRODUODENOSCOPY (EGD)    . COLONOSCOPY WITH PROPOFOL N/A 03/16/2016   Procedure: COLONOSCOPY WITH PROPOFOL;  Surgeon: [name], MD;  Location: [facility] ENDOSCOPY;  Service: Endoscopy;  Laterality: N/A;  . ESOPHAGOGASTRODUODENOSCOPY (EGD) WITH PROPOFOL N/A 02/28/2020   Procedure: ESOPHAGOGASTRODUODENOSCOPY (EGD) WITH PROPOFOL;  Surgeon: Toledo, Boykin Nearing, MD;  Location: [facility] ENDOSCOPY;  Service: Gastroenterology;  Laterality: N/A;  . EYE SURGERY    . HERNIA REPAIR    . LOWER EXTREMITY ANGIOGRAPHY Left 10/09/2019   Procedure: LOWER EXTREMITY ANGIOGRAPHY;  Surgeon: Annice Needy, MD;  Location: [facility] INVASIVE CV  LAB;  Service: Cardiovascular;  Laterality: Left;  . NISSEN FUNDOPLICATION    . testicular vein surgery    . TONSILLECTOMY    . TOTAL KNEE ARTHROPLASTY      Family History  Problem Relation Age of Onset  . Varicose Veins Neg Hx   . Vision loss Neg Hx   . Stroke Neg Hx     Allergies  Allergen Reactions  . Nsaids   . Tolmetin     CBC Latest Ref Rng & Units 04/18/2013 06/20/2012 06/20/2012  WBC 3.8 - 10.6 x10 3/mm 3 4.2 7.0 6.7  Hemoglobin 13.0 - 18.0 g/dL 94.8 54.6 27.0  Hematocrit 40.0 - 52.0 % 38.5(L) 38.8(L) 40.2  Platelets 150 - 440 x10 3/mm 3 162 112(L) 137(L)      CMP     Component Value Date/Time   NA 141 04/18/2013 0907   K 3.7 04/18/2013 0907   CL 105 04/18/2013 0907   CO2 30 04/18/2013 0907   GLUCOSE 159 (H) 04/18/2013 0907   BUN 17 10/09/2019 0939   BUN 8 04/18/2013 0907   CREATININE 1.38 (H) 10/09/2019 0939   CREATININE 1.08 04/18/2013 0907   CALCIUM 8.6 04/18/2013 0907   PROT 7.3 04/18/2013 0907   ALBUMIN 3.9 04/18/2013 0907   AST 24 04/18/2013 0907   ALT 29 04/18/2013 0907   ALKPHOS 54 04/18/2013 0907   BILITOT 0.6 04/18/2013 0907   GFRNONAA 48 (L) 10/09/2019 0939   GFRNONAA >60 04/18/2013 0907   GFRAA 56 (L) 10/09/2019 0939   GFRAA >60 04/18/2013 0907     No results found.     Assessment & Plan:   1. Atherosclerosis of native arteries of the extremities with ulceration (HCC) Recommend:  The patient has evidence of severe atherosclerotic changes of both lower extremities with rest pain that is associated with preulcerative changes and impending tissue loss of the foot.  This represents a limb threatening ischemia and places the patient at the risk for limb loss.  Patient should undergo angiography of the lower extremities with the hope for intervention for limb salvage.  The risks and benefits as well as the alternative therapies was discussed in detail with the patient.  All questions were answered.  Patient agrees to proceed with  angiography.  The patient will follow up with me in the office after the procedure.    2. HTN (hypertension), benign Continue antihypertensive medications as already ordered, these medications have been reviewed and there are no changes at this time.   3. Diabetes mellitus with stage 3 chronic kidney disease (HCC) Continue hypoglycemic medications as already ordered, these medications have been reviewed  and there are no changes at this time.  Hgb A1C to be monitored as already arranged by primary service    Current Outpatient Medications on File Prior to Visit  Medication Sig Dispense Refill  . acetaminophen (TYLENOL) 500 MG tablet Take 1,000 mg by mouth every 6 (six) hours as needed.    . aspirin 81 MG tablet Take 81 mg by mouth daily.    . atorvastatin (LIPITOR) 40 MG tablet Take 40 mg by mouth daily.    . b complex vitamins capsule Take 1 capsule by mouth daily.    . cetirizine (ZYRTEC) 10 MG chewable tablet Chew 10 mg by mouth daily.    . clopidogrel (PLAVIX) 75 MG tablet Take 75 mg by mouth daily.    . colchicine 0.6 MG tablet Take 0.6 mg by mouth 2 (two) times daily as needed.    . glimepiride (AMARYL) 2 MG tablet TAKE 1 TABLET BY MOUTH ONCE DAILY WITH BREAKFAST    . JARDIANCE 10 MG TABS tablet TAKE 1 TABLET BY MOUTH ONCE DAILY WITH BREAKFAST    . ketorolac (ACULAR) 0.4 % SOLN 1 drop 4 (four) times daily.    . losartan (COZAAR) 100 MG tablet Take 100 mg by mouth daily.    . metFORMIN (GLUCOPHAGE-XR) 500 MG 24 hr tablet Take 500 mg by mouth daily with breakfast.    . methocarbamol (ROBAXIN) 500 MG tablet Take 500 mg by mouth daily.     . metoprolol succinate (TOPROL-XL) 100 MG 24 hr tablet Take 100 mg by mouth daily. Take with or immediately following a meal.    . omeprazole (PRILOSEC) 20 MG capsule Take 20 mg by mouth daily.    . tiZANidine (ZANAFLEX) 4 MG tablet Take 4 mg by mouth 3 (three) times daily as needed.    . vitamin B-12 (CYANOCOBALAMIN) 1000 MCG tablet Take 1,000  mcg by mouth daily.     No current facility-administered medications on file prior to visit.    There are no Patient Instructions on file for this visit. No follow-ups on file.   Marijah Larranaga E Anyla Israelson, NP   

## 2021-02-04 NOTE — Telephone Encounter (Signed)
Patient's spouse called back and is now scheduled with Dr. Wyn Quaker for a LLE angio on 02/10/21 with a 9:00 am arrival time to the [facility]. Covid testing on 02/06/21 between 8-2 pm at the [facility]. Pre-procedure instructions were discussed and will be mailed.

## 2021-02-06 ENCOUNTER — Other Ambulatory Visit: Payer: Self-pay

## 2021-02-06 ENCOUNTER — Other Ambulatory Visit
Admission: RE | Admit: 2021-02-06 | Discharge: 2021-02-06 | Disposition: A | Discharge status: Home / Self Care | Payer: Medicare Other | Source: Ambulatory Visit | Attending: Vascular Surgery | Admitting: Vascular Surgery

## 2021-02-06 DIAGNOSIS — Z01812 Encounter for preprocedural laboratory examination: Secondary | ICD-10-CM | POA: Diagnosis present

## 2021-02-06 DIAGNOSIS — Z20822 Contact with and (suspected) exposure to covid-19: Secondary | ICD-10-CM | POA: Diagnosis not present

## 2021-02-06 LAB — SARS CORONAVIRUS 2 (TAT 6-24 HRS): SARS Coronavirus 2: NEGATIVE

## 2021-02-09 ENCOUNTER — Other Ambulatory Visit (INDEPENDENT_AMBULATORY_CARE_PROVIDER_SITE_OTHER): Payer: Self-pay | Admitting: Nurse Practitioner

## 2021-02-10 ENCOUNTER — Encounter: Payer: Self-pay | Admitting: Vascular Surgery

## 2021-02-10 ENCOUNTER — Ambulatory Visit
Admission: RE | Admit: 2021-02-10 | Discharge: 2021-02-10 | Disposition: A | Discharge status: Home / Self Care | Payer: Medicare Other | Attending: Vascular Surgery | Admitting: Vascular Surgery

## 2021-02-10 ENCOUNTER — Other Ambulatory Visit: Payer: Self-pay

## 2021-02-10 ENCOUNTER — Encounter
Admission: RE | Disposition: A | Discharge status: Home / Self Care | Payer: Self-pay | Source: Home / Self Care | Attending: Vascular Surgery

## 2021-02-10 DIAGNOSIS — I70223 Atherosclerosis of native arteries of extremities with rest pain, bilateral legs: Secondary | ICD-10-CM

## 2021-02-10 DIAGNOSIS — I129 Hypertensive chronic kidney disease with stage 1 through stage 4 chronic kidney disease, or unspecified chronic kidney disease: Secondary | ICD-10-CM | POA: Insufficient documentation

## 2021-02-10 DIAGNOSIS — Z7982 Long term (current) use of aspirin: Secondary | ICD-10-CM | POA: Insufficient documentation

## 2021-02-10 DIAGNOSIS — Z7902 Long term (current) use of antithrombotics/antiplatelets: Secondary | ICD-10-CM | POA: Diagnosis not present

## 2021-02-10 DIAGNOSIS — N183 Chronic kidney disease, stage 3 unspecified: Secondary | ICD-10-CM | POA: Diagnosis not present

## 2021-02-10 DIAGNOSIS — Z79899 Other long term (current) drug therapy: Secondary | ICD-10-CM | POA: Diagnosis not present

## 2021-02-10 DIAGNOSIS — E1151 Type 2 diabetes mellitus with diabetic peripheral angiopathy without gangrene: Secondary | ICD-10-CM | POA: Diagnosis not present

## 2021-02-10 DIAGNOSIS — I70229 Atherosclerosis of native arteries of extremities with rest pain, unspecified extremity: Secondary | ICD-10-CM

## 2021-02-10 DIAGNOSIS — Z7984 Long term (current) use of oral hypoglycemic drugs: Secondary | ICD-10-CM | POA: Diagnosis not present

## 2021-02-10 DIAGNOSIS — E1122 Type 2 diabetes mellitus with diabetic chronic kidney disease: Secondary | ICD-10-CM | POA: Diagnosis not present

## 2021-02-10 HISTORY — PX: LOWER EXTREMITY ANGIOGRAPHY: CATH118251

## 2021-02-10 LAB — GLUCOSE, CAPILLARY
Glucose-Capillary: 126 mg/dL — ABNORMAL HIGH (ref 70–99)
Glucose-Capillary: 97 mg/dL (ref 70–99)

## 2021-02-10 LAB — CREATININE, SERUM
Creatinine, Ser: 1.47 mg/dL — ABNORMAL HIGH (ref 0.61–1.24)
GFR, Estimated: 48 mL/min — ABNORMAL LOW (ref >60)

## 2021-02-10 LAB — BUN: BUN: 30 mg/dL — ABNORMAL HIGH (ref 8–23)

## 2021-02-10 SURGERY — LOWER EXTREMITY ANGIOGRAPHY
Anesthesia: Moderate Sedation | Site: Leg Lower | Laterality: Left

## 2021-02-10 MED ORDER — METHYLPREDNISOLONE SODIUM SUCC 125 MG IJ SOLR: 125.0000 mg | Freq: Once | INTRAMUSCULAR | Status: DC | PRN

## 2021-02-10 MED ORDER — MIDAZOLAM HCL 2 MG/ML PO SYRP: 8.0000 mg | ORAL_SOLUTION | Freq: Once | ORAL | Status: DC | PRN

## 2021-02-10 MED ORDER — DIPHENHYDRAMINE HCL 50 MG/ML IJ SOLN: 50.0000 mg | Freq: Once | INTRAMUSCULAR | Status: DC | PRN

## 2021-02-10 MED ORDER — CEFAZOLIN SODIUM-DEXTROSE 2-4 GM/100ML-% IV SOLN
INTRAVENOUS | Status: AC
  Administered 2021-02-10: 2 g via INTRAVENOUS
  Filled 2021-02-10: qty 100

## 2021-02-10 MED ORDER — MIDAZOLAM HCL 5 MG/5ML IJ SOLN
INTRAMUSCULAR | Status: AC
  Filled 2021-02-10: qty 5

## 2021-02-10 MED ORDER — FAMOTIDINE 20 MG PO TABS: 40.0000 mg | ORAL_TABLET | Freq: Once | ORAL | Status: DC | PRN

## 2021-02-10 MED ORDER — FENTANYL CITRATE (PF) 100 MCG/2ML IJ SOLN
INTRAMUSCULAR | Status: DC | PRN
  Administered 2021-02-10: 50 ug via INTRAVENOUS

## 2021-02-10 MED ORDER — MIDAZOLAM HCL 2 MG/2ML IJ SOLN
INTRAMUSCULAR | Status: DC | PRN
  Administered 2021-02-10: 1 mg via INTRAVENOUS
  Administered 2021-02-10: 2 mg via INTRAVENOUS

## 2021-02-10 MED ORDER — HEPARIN SODIUM (PORCINE) 1000 UNIT/ML IJ SOLN
INTRAMUSCULAR | Status: AC
  Filled 2021-02-10: qty 1

## 2021-02-10 MED ORDER — CEFAZOLIN SODIUM-DEXTROSE 2-4 GM/100ML-% IV SOLN: 2.0000 g | Freq: Once | INTRAVENOUS | Status: AC

## 2021-02-10 MED ORDER — SODIUM CHLORIDE 0.9 % IV SOLN: INTRAVENOUS | Status: DC

## 2021-02-10 MED ORDER — IODIXANOL 320 MG/ML IV SOLN
INTRAVENOUS | Status: DC | PRN
  Administered 2021-02-10: 50 mL via INTRA_ARTERIAL

## 2021-02-10 MED ORDER — HYDROMORPHONE HCL 1 MG/ML IJ SOLN: 1.0000 mg | Freq: Once | INTRAMUSCULAR | Status: DC | PRN

## 2021-02-10 MED ORDER — FENTANYL CITRATE (PF) 100 MCG/2ML IJ SOLN
INTRAMUSCULAR | Status: AC
  Filled 2021-02-10: qty 2

## 2021-02-10 MED ORDER — HEPARIN SODIUM (PORCINE) 1000 UNIT/ML IJ SOLN
INTRAMUSCULAR | Status: DC | PRN
  Administered 2021-02-10: 5000 [IU] via INTRAVENOUS

## 2021-02-10 MED ORDER — ONDANSETRON HCL 4 MG/2ML IJ SOLN: 4.0000 mg | Freq: Four times a day (QID) | INTRAMUSCULAR | Status: DC | PRN

## 2021-02-10 SURGICAL SUPPLY — 18 items
BALLN LUTONIX 018 5X80X130 (BALLOONS) ×2
BALLN ULTRVRSE 2.5X220X150 (BALLOONS) ×2
BALLOON LUTONIX 018 5X80X130 (BALLOONS) ×1 IMPLANT
BALLOON ULTRVRSE 2.5X220X150 (BALLOONS) ×1 IMPLANT
CATH ANGIO 5F PIGTAIL 65CM (CATHETERS) ×2 IMPLANT
CATH BEACON 5 .038 100 VERT TP (CATHETERS) ×2 IMPLANT
CATH NAVICROSS ANGLED 135CM (MICROCATHETER) ×2 IMPLANT
COVER PROBE U/S 5X48 (MISCELLANEOUS) ×2 IMPLANT
DEVICE STARCLOSE SE CLOSURE (Vascular Products) ×2 IMPLANT
GLIDEWIRE ADV .035X260CM (WIRE) ×2 IMPLANT
KIT ENCORE 26 ADVANTAGE (KITS) ×2 IMPLANT
PACK ANGIOGRAPHY (CUSTOM PROCEDURE TRAY) ×2 IMPLANT
SHEATH BRITE TIP 5FRX11 (SHEATH) ×2 IMPLANT
SHEATH PINNACLE ST 6F 65CM (SHEATH) ×2 IMPLANT
SYR MEDRAD MARK 7 150ML (SYRINGE) ×2 IMPLANT
TUBING CONTRAST HIGH PRESS 72 (TUBING) ×2 IMPLANT
WIRE G V18X300CM (WIRE) ×2 IMPLANT
WIRE GUIDERIGHT .035X150 (WIRE) ×2 IMPLANT

## 2021-02-10 NOTE — Discharge Instructions (Signed)
Femoral Site Care  This sheet gives you information about how to care for yourself after your procedure. Your health care provider may also give you more specific instructions. If you have problems or questions, contact your health care provider. What can I expect after the procedure? After the procedure, it is common to have:  Bruising that usually fades within 1-2 weeks.  Tenderness at the site. Follow these instructions at home: Wound care  Follow instructions from your health care provider about how to take care of your insertion site. Make sure you: ? Wash your hands with soap and water before you change your bandage (dressing). If soap and water are not available, use hand sanitizer. ? Change your dressing as told by your health care provider. ? Leave stitches (sutures), skin glue, or adhesive strips in place. These skin closures may need to stay in place for 2 weeks or longer. If adhesive strip edges start to loosen and curl up, you may trim the loose edges. Do not remove adhesive strips completely unless your health care provider tells you to do that.  Do not take baths, swim, or use a hot tub until your health care provider approves.  You may shower 24-48 hours after the procedure or as told by your health care provider. ? Gently wash the site with plain soap and water. ? Pat the area dry with a clean towel. ? Do not rub the site. This may cause bleeding.  Do not apply powder or lotion to the site. Keep the site clean and dry.  Check your femoral site every day for signs of infection. Check for: ? Redness, swelling, or pain. ? Fluid or blood. ? Warmth. ? Pus or a bad smell. Activity  For the first 2-3 days after your procedure, or as long as directed: ? Avoid climbing stairs as much as possible. ? Do not squat.  Do not lift anything that is heavier than 10 lb (4.5 kg), or the limit that you are told, until your health care provider says that it is safe.  Rest as  directed. ? Avoid sitting for a long time without moving. Get up to take short walks every 1-2 hours.  Do not drive for 24 hours if you were given a medicine to help you relax (sedative). General instructions  Take over-the-counter and prescription medicines only as told by your health care provider.  Keep all follow-up visits as told by your health care provider. This is important. Contact a health care provider if you have:  A fever or chills.  You have redness, swelling, or pain around your insertion site. Get help right away if:  The catheter insertion area swells very fast.  You pass out.  You suddenly start to sweat or your skin gets clammy.  The catheter insertion area is bleeding, and the bleeding does not stop when you hold steady pressure on the area.  The area near or just beyond the catheter insertion site becomes pale, cool, tingly, or numb. These symptoms may represent a serious problem that is an emergency. Do not wait to see if the symptoms will go away. Get medical help right away. Call your local emergency services (911 in the [place] Do not drive yourself to the hospital. Summary  After the procedure, it is common to have bruising that usually fades within 1-2 weeks.  Check your femoral site every day for signs of infection.  Do not lift anything that is heavier than 10 lb (4.5 kg), or   the limit that you are told, until your health care provider says that it is safe. This information is not intended to replace advice given to you by your health care provider. Make sure you discuss any questions you have with your health care provider. Document Revised: 06/28/2020 Document Reviewed: 06/28/2020 Elsevier Patient Education  2021 [employer]  

## 2021-02-10 NOTE — Op Note (Signed)
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  Ultrasound guidance for vascular access right femoral artery             2.  Catheter placement into left common femoral artery from right femoral approach             3.  Aortogram and selective left lower extremity angiogram             4.  Percutaneous transluminal angioplasty of left anterior tibial artery with 2.5 mm diameter angioplasty balloon             5.  Percutaneous transluminal angioplasty of left proximal popliteal artery/distal SFA with 5 mm diameter Lutonix drug-coated angioplasty balloon  6.  StarClose closure device right femoral artery  EBL: 10 cc  Contrast: 50 cc  Fluoro Time: 4.4 minutes  Moderate Conscious Sedation Time: approximately 28 minutes using 3 mg of Versed and 50 mcg of Fentanyl              Indications:  Patient is a 79 y.o.male with rest pain in both feet. The patient has noninvasive study showing reduced perfusion with monophasic flow distally and nearly flat digital waveforms on the left. The patient is brought in for angiography for further evaluation and potential treatment.  Due to the limb threatening nature of the situation, angiogram was performed for attempted limb salvage. The patient is aware that if the procedure fails, amputation would be expected.  The patient also understands that even with successful revascularization, amputation may still be required due to the severity of the situation.  Risks and benefits are discussed and informed consent is obtained.   Procedure:  The patient was identified and appropriate procedural time out was performed.  The patient was then placed supine on the table and prepped and draped in the usual sterile  fashion. Moderate conscious sedation was administered during a face to face encounter with the patient throughout the procedure with my supervision of the RN administering medicines and monitoring the patient's vital signs, pulse oximetry, telemetry and mental status throughout from the start of the procedure until the patient was taken to the recovery room. Ultrasound was used to evaluate the right common femoral artery.  It was patent .  A digital ultrasound image was acquired.  A Seldinger needle was used to access the right common femoral artery under direct ultrasound guidance and a permanent image was performed.  A 0.035 J wire was advanced without resistance and a 5Fr sheath was placed.  Pigtail catheter was placed into the aorta and an AP aortogram was performed. This demonstrated normal renal arteries and normal aorta and iliac segments without significant stenosis. I then crossed the aortic bifurcation and advanced to the left femoral head. Selective left lower extremity angiogram was then performed. This demonstrated normal common femoral artery, profunda femoris artery, and proximal to mid superficial femoral artery.  In the distal SFA and above-knee popliteal artery there was a moderate stenosis in the 50 to 60% range several centimeters above the previously placed stent in the popliteal artery which was widely patent.  The tibioperoneal trunk had mild disease in the peroneal artery was the dominant runoff distally without focal stenosis.  The anterior tibial   artery had about a 90% stenosis in the proximal segment then normalized over about 6 to 8 cm when it then again had occlusion in several segments before it occluded in the mid to distal segment and did not reconstitute.  The posterior tibial artery is occluded proximally and was not seen to reconstitute distally. It was felt that it was in the patient's best interest to proceed with intervention after these images to avoid a second procedure and a  larger amount of contrast and fluoroscopy based off of the findings from the initial angiogram. The patient was systemically heparinized and a 6 French 65 cm sheath was then placed over the Terumo Advantage wire. I then used a Kumpe catheter and the advantage wire to navigate through the popliteal lesion and down into the proximal anterior tibial artery.  I then exchanged for a V 18 wire and a Nava cross catheter.  A similar get a wire into the distal anterior tibial artery but even the Nava cross catheter had difficulty crossing the occlusion beyond the mid anterior tibial artery.  I elected to go ahead and angioplastied the proximal to mid left anterior tibial artery with 2.5 mm diameter by 22 cm length angioplasty balloon.  This is inflated to 14 atm for 1 minute.  Completion imaging showed these areas where there was a near occlusive stenosis in the proximal AT and occlusion in the proximal to mid AT to now have less than 30% residual stenosis, although the vessel remained occluded in the mid to distal segment without distal reconstitution.  Even after balloon angioplasty I cannot get a catheter to go further down to try to find a more distal target, so I elected to treat the moderate lesion in the distal SFA and proximal popliteal artery.  A 5 mm diameter by 8 cm length Lutonix drug-coated angioplasty balloon was inflated to 12 atm for 1 minute and the most distal SFA and was proximal popliteal artery above the previously placed stent.  Completion imaging showed only about a 10 to 15% residual stenosis. I elected to terminate the procedure. The sheath was removed and StarClose closure device was deployed in the right femoral artery with excellent hemostatic result. The patient was taken to the recovery room in stable condition having tolerated the procedure well.  Findings:               Aortogram:  Normal renal arteries, normal aorta and iliac arteries which were tortuous but not stenotic.  Circulation time  was very slow due to poor cardiac output presumably.             Left lower Extremity:  Normal common femoral artery, profunda femoris artery, and proximal to mid superficial femoral artery.  In the distal SFA and above-knee popliteal artery there was a moderate stenosis in the 50 to 60% range several centimeters above the previously placed stent in the popliteal artery which was widely patent.  The tibioperoneal trunk had mild disease in the peroneal artery was the dominant runoff distally without focal stenosis.  The anterior tibial artery had about a 90% stenosis in the proximal segment then normalized over about 6 to 8 cm when it then again had occlusion in several segments before it occluded in the mid to distal segment and did not reconstitute.  The posterior tibial artery is occluded proximally and was not seen to reconstitute distally.   Disposition: Patient was taken to the recovery room in stable condition having tolerated the procedure well.    Complications: None    02/10/2021 11:57 AM   This note was created with Dragon Medical transcription system. Any errors in dictation are purely unintentional. 

## 2021-02-10 NOTE — Interval H&P Note (Signed)
History and Physical Interval Note:  02/10/2021 10:00 AM  Bobby Guest [name]  has presented today for surgery, with the diagnosis of LT LE Angio  BARD   ASO w rest pain Covid March 31.  The various methods of treatment have been discussed with the patient and family. After consideration of risks, benefits and other options for treatment, the patient has consented to  Procedure(s): LOWER EXTREMITY ANGIOGRAPHY (Left) as a surgical intervention.  The patient's history has been reviewed, patient examined, no change in status, stable for surgery.  I have reviewed the patient's chart and labs.  Questions were answered to the patient's satisfaction.     [name]

## 2021-02-11 ENCOUNTER — Emergency Department
Admission: EM | Admit: 2021-02-11 | Discharge: 2021-02-11 | Disposition: A | Discharge status: Home / Self Care | Payer: Medicare Other | Attending: Emergency Medicine | Admitting: Emergency Medicine

## 2021-02-11 DIAGNOSIS — X58XXXA Exposure to other specified factors, initial encounter: Secondary | ICD-10-CM | POA: Insufficient documentation

## 2021-02-11 DIAGNOSIS — T148XXA Other injury of unspecified body region, initial encounter: Secondary | ICD-10-CM

## 2021-02-11 DIAGNOSIS — N183 Chronic kidney disease, stage 3 unspecified: Secondary | ICD-10-CM | POA: Diagnosis not present

## 2021-02-11 DIAGNOSIS — T82838A Hemorrhage of vascular prosthetic devices, implants and grafts, initial encounter: Secondary | ICD-10-CM | POA: Insufficient documentation

## 2021-02-11 DIAGNOSIS — Z87891 Personal history of nicotine dependence: Secondary | ICD-10-CM | POA: Insufficient documentation

## 2021-02-11 DIAGNOSIS — Z96653 Presence of artificial knee joint, bilateral: Secondary | ICD-10-CM | POA: Insufficient documentation

## 2021-02-11 DIAGNOSIS — E1122 Type 2 diabetes mellitus with diabetic chronic kidney disease: Secondary | ICD-10-CM | POA: Insufficient documentation

## 2021-02-11 DIAGNOSIS — Z7984 Long term (current) use of oral hypoglycemic drugs: Secondary | ICD-10-CM | POA: Diagnosis not present

## 2021-02-11 DIAGNOSIS — Z7982 Long term (current) use of aspirin: Secondary | ICD-10-CM | POA: Insufficient documentation

## 2021-02-11 DIAGNOSIS — E1139 Type 2 diabetes mellitus with other diabetic ophthalmic complication: Secondary | ICD-10-CM | POA: Insufficient documentation

## 2021-02-11 DIAGNOSIS — Z79899 Other long term (current) drug therapy: Secondary | ICD-10-CM | POA: Diagnosis not present

## 2021-02-11 DIAGNOSIS — I2510 Atherosclerotic heart disease of native coronary artery without angina pectoris: Secondary | ICD-10-CM | POA: Insufficient documentation

## 2021-02-11 DIAGNOSIS — Z7951 Long term (current) use of inhaled steroids: Secondary | ICD-10-CM | POA: Diagnosis not present

## 2021-02-11 DIAGNOSIS — E1169 Type 2 diabetes mellitus with other specified complication: Secondary | ICD-10-CM | POA: Insufficient documentation

## 2021-02-11 DIAGNOSIS — E785 Hyperlipidemia, unspecified: Secondary | ICD-10-CM | POA: Insufficient documentation

## 2021-02-11 DIAGNOSIS — Z7902 Long term (current) use of antithrombotics/antiplatelets: Secondary | ICD-10-CM | POA: Insufficient documentation

## 2021-02-11 DIAGNOSIS — I129 Hypertensive chronic kidney disease with stage 1 through stage 4 chronic kidney disease, or unspecified chronic kidney disease: Secondary | ICD-10-CM | POA: Diagnosis not present

## 2021-02-11 MED ORDER — ACETAMINOPHEN 500 MG PO TABS
1000.0000 mg | ORAL_TABLET | Freq: Once | ORAL | Status: AC
  Administered 2021-02-11: 1000 mg via ORAL
  Filled 2021-02-11: qty 2

## 2021-02-11 MED ORDER — TRANEXAMIC ACID FOR EPISTAXIS
500.0000 mg | Freq: Once | TOPICAL | Status: AC
  Administered 2021-02-11: 500 mg via TOPICAL
  Filled 2021-02-11: qty 10

## 2021-02-11 NOTE — ED Notes (Signed)
Vital signs stable. 

## 2021-02-11 NOTE — ED Notes (Signed)
Pt unable to sign for d/c due to broken topaz signature pad. Pt verbalizes d/c instructions with no further questions at this time. 

## 2021-02-11 NOTE — ED Provider Notes (Signed)
[facility] Emergency Department Provider Note  ____________________________________________   Event Date/Time   First MD Initiated Contact with Patient 02/11/21 [phone]     (approximate)  I have reviewed the triage vital signs and the nursing notes.   HISTORY  Chief Complaint hemorrhage (Patient arrived to ED from home via EMS c/o bleeding from R femoral site. Pt states he had a procedure done yesterday & when )    HPI Avin Gibbons. is a 80 y.o. male with history of CAD, diabetes, hypertension, hyperlipidemia who presents to the emergency department with bleeding from a right femoral artery vascular access site after aortogram and left lower extremity angiogram that was performed yesterday by Dr. Lucky Cowboy with vascular surgery.  States he is sore in this area with a pain scale of 5/10.  Denies fevers, other drainage, chest pain or shortness of breath, dizziness.  States he woke up to go to the bathroom and noticed that the gauze was soaked with blood.  It was not dripping blood, pouring blood.  He has not noticed any further bleeding other than the gauze was soaked.  Was told to hold his aspirin and Plavix for 3 days following the procedure.  States he has not taken his medication since prior to the procedure.  He did not change his bandage, dressing prior to arrival.  States this is still the same bandage that was placed after his procedure.    Procedure(s) Performed: 1. Ultrasound guidance for vascular access right femoral artery 2. Catheter placement into left common femoral artery from right femoral approach 3. Aortogram and selective left lower extremity angiogram 4. Percutaneous transluminal angioplasty of left anterior tibial artery with 2.5 mm diameter angioplasty balloon 5. Percutaneous transluminal angioplasty of left proximal popliteal artery/distal SFA with 5 mm diameter Lutonix drug-coated  angioplasty balloon             6.  StarClose closure device right femoral artery    Past Medical History:  Diagnosis Date  . Arthritis   . Back pain   . Coronary artery disease   . Diabetes mellitus without complication ([place])   . GERD (gastroesophageal reflux disease)   . History of hiatal hernia   . Hyperlipidemia   . Hypertension   . Sleep apnea     Patient Active Problem List   Diagnosis Date Noted  . Hyperlipidemia 02/04/2021  . Current use of long term anticoagulation 05/30/2020  . Status post Nissen fundoplication [id number]/3810  . Atherosclerosis of native arteries of the extremities with ulceration ([place]) 08/15/2019  . Right arm pain 12/27/2018  . OSA (obstructive sleep apnea) 05/30/2018  . Benign localized hyperplasia of prostate with urinary obstruction 08/31/2017  . Incomplete emptying of bladder 08/31/2017  . Renal failure, chronic, stage 3 (moderate) ([place]) 08/31/2017  . Health care maintenance 01/23/2016  . Atherosclerosis of abdominal aorta ([place]) 09/09/2014  . PVD (peripheral vascular disease) ([place]) 09/09/2014  . PA (pernicious anemia) 05/23/2014  . Diabetes mellitus with stage 3 chronic kidney disease ([place]) 05/12/2014  . GERD (gastroesophageal reflux disease) 05/12/2014  . HTN (hypertension), benign 05/12/2014  . Vitreous floaters 05/10/2014  . Coronary atherosclerosis 04/20/2014  . Lower back pain 04/13/2014  . Glaucoma suspect of both eyes 03/06/2014  . Hx of total knee replacement 01/27/2013  . Dry eye syndrome 08/30/2012  . Pseudophakia of left eye 08/30/2012    Past Surgical History:  Procedure Laterality Date  . APPENDECTOMY    . CARDIAC CATHETERIZATION    .  CHOLECYSTECTOMY    . COLONOSCOPY WITH ESOPHAGOGASTRODUODENOSCOPY (EGD)    . COLONOSCOPY WITH PROPOFOL N/A 03/16/2016   Procedure: COLONOSCOPY WITH PROPOFOL;  Surgeon: Lollie Sails, MD;  Location: [facility] ENDOSCOPY;  Service: Endoscopy;  Laterality: N/A;  . ESOPHAGOGASTRODUODENOSCOPY (EGD) WITH  PROPOFOL N/A 02/28/2020   Procedure: ESOPHAGOGASTRODUODENOSCOPY (EGD) WITH PROPOFOL;  Surgeon: Toledo, Benay Pike, MD;  Location: [facility] ENDOSCOPY;  Service: Gastroenterology;  Laterality: N/A;  . EYE SURGERY    . HERNIA REPAIR    . LOWER EXTREMITY ANGIOGRAPHY Left 10/09/2019   Procedure: LOWER EXTREMITY ANGIOGRAPHY;  Surgeon: Algernon Huxley, MD;  Location: [place] CV LAB;  Service: Cardiovascular;  Laterality: Left;  . LOWER EXTREMITY ANGIOGRAPHY Left 02/10/2021   Procedure: LOWER EXTREMITY ANGIOGRAPHY;  Surgeon: Algernon Huxley, MD;  Location: [place] CV LAB;  Service: Cardiovascular;  Laterality: Left;  . NISSEN FUNDOPLICATION    . testicular vein surgery    . TONSILLECTOMY    . TOTAL KNEE ARTHROPLASTY      Prior to Admission medications   Medication Sig Start Date End Date Taking? Authorizing Provider  acetaminophen (TYLENOL) 500 MG tablet Take 1,000 mg by mouth every 6 (six) hours as needed.    [provider]  albuterol (VENTOLIN HFA) 108 (90 Base) MCG/ACT inhaler Inhale into the lungs. Patient not taking: Reported on 02/10/2021 11/18/20 11/18/21  [provider]  aspirin 81 MG tablet Take 81 mg by mouth daily.    [provider]  atorvastatin (LIPITOR) 40 MG tablet Take 40 mg by mouth daily. 07/03/19   [provider]  b complex vitamins capsule Take 1 capsule by mouth daily.    [provider]  Blood Glucose Monitoring Suppl (ACCU-CHEK GUIDE ME) w/Device KIT See admin instructions. 12/25/20   [provider]  cetirizine (ZYRTEC) 10 MG chewable tablet Chew 10 mg by mouth daily.    [provider]  clopidogrel (PLAVIX) 75 MG tablet Take 75 mg by mouth daily.    [provider]  colchicine 0.6 MG tablet Take 0.6 mg by mouth 2 (two) times daily as needed. 05/30/19   [provider]  Cysteamine Bitartrate (PROCYSBI) 300 MG PACK Use 1 each once daily Use as instructed. 12/25/20 12/26/21  [provider]   doxycycline (VIBRAMYCIN) 100 MG capsule Take 1 capsule (100 mg total) by mouth 2 (two) times daily. Patient not taking: Reported on 02/10/2021 02/04/21   Kris Hartmann, NP  Fluticasone-Salmeterol (ADVAIR) 250-50 MCG/DOSE AEPB Inhale into the lungs. Patient not taking: Reported on 02/10/2021 11/18/20 11/18/21  [provider]  glimepiride (AMARYL) 2 MG tablet TAKE 1 TABLET BY MOUTH ONCE DAILY WITH BREAKFAST 07/06/19   [provider]  JARDIANCE 10 MG TABS tablet TAKE 1 TABLET BY MOUTH ONCE DAILY WITH BREAKFAST 07/17/19   [provider]  ketorolac (ACULAR) 0.4 % SOLN 1 drop 4 (four) times daily. Patient not taking: Reported on 02/10/2021    [provider]  losartan (COZAAR) 100 MG tablet Take 100 mg by mouth daily. 06/18/19   [provider]  metFORMIN (GLUCOPHAGE) 500 MG tablet Take 500 mg by mouth 2 (two) times daily. 01/18/21   [provider]  metFORMIN (GLUCOPHAGE-XR) 500 MG 24 hr tablet Take 500 mg by mouth daily with breakfast.    [provider]  methocarbamol (ROBAXIN) 500 MG tablet Take 500 mg by mouth daily.     [provider]  metoprolol succinate (TOPROL-XL) 100 MG 24 hr tablet Take 100 mg  by mouth daily. Take with or immediately following a meal.    [provider]  omeprazole (PRILOSEC) 20 MG capsule Take 20 mg by mouth daily.    [provider]  tiZANidine (ZANAFLEX) 4 MG tablet Take 4 mg by mouth 3 (three) times daily as needed. Patient not taking: Reported on 02/10/2021 06/02/19   [provider]  traMADol (ULTRAM) 50 MG tablet Take 1 tablet (50 mg total) by mouth every 6 (six) hours as needed. Patient not taking: Reported on 02/10/2021 02/04/21   Kris Hartmann, NP  vitamin B-12 (CYANOCOBALAMIN) 1000 MCG tablet Take 1,000 mcg by mouth daily.    [provider]    Allergies Nsaids and Tolmetin  Family History  Problem Relation Age of Onset  . Varicose Veins Neg Hx   . Vision loss  Neg Hx   . Stroke Neg Hx     Social History Social History   Tobacco Use  . Smoking status: Former Research scientist (life sciences)  . Smokeless tobacco: Never Used  Vaping Use  . Vaping Use: Never used  Substance Use Topics  . Alcohol use: Yes    Alcohol/week: 1.0 standard drink    Types: 1 Cans of beer per week    Comment: occassional   . Drug use: No    Review of Systems Constitutional: No fever. Eyes: No visual changes. ENT: No sore throat. Cardiovascular: Denies chest pain. Respiratory: Denies shortness of breath. Gastrointestinal: No nausea, vomiting, diarrhea. Genitourinary: Negative for dysuria. Musculoskeletal: Negative for back pain. Skin: Negative for rash. Neurological: Negative for focal weakness or numbness.  ____________________________________________   PHYSICAL EXAM:  VITAL SIGNS: ED Triage Vitals  Enc Vitals Group     BP 02/11/21 0541 (!) 142/83     Pulse Rate 02/11/21 0541 68     Resp --      Temp 02/11/21 0541 98.4 F (36.9 C)     Temp Source 02/11/21 0541 Oral     SpO2 02/11/21 0541 98 %     Weight --      Height --      Head Circumference --      Peak Flow --      Pain Score 02/11/21 0542 5     Pain Loc --      Pain Edu? --      Excl. in [place]? --    CONSTITUTIONAL: Alert and oriented and responds appropriately to questions. Well-appearing; well-nourished HEAD: Normocephalic EYES: Conjunctivae clear, pupils appear equal, EOM appear intact ENT: normal nose; moist mucous membranes NECK: Supple, normal ROM CARD: RRR; S1 and S2 appreciated; no murmurs, no clicks, no rubs, no gallops RESP: Normal chest excursion without splinting or tachypnea; breath sounds clear and equal bilaterally; no wheezes, no rhonchi, no rales, no hypoxia or respiratory distress, speaking full sentences ABD/GI: Normal bowel sounds; non-distended; soft, non-tender, no rebound, no guarding, no peritoneal signs, no hepatosplenomegaly BACK: The back appears normal EXT: Puncture wound to the  right femoral arterial area with only a small drop of blood coming out after the soaked gauze was removed.  He has a strong right and left 2+ femoral pulse.  I am able to Doppler biphasic DP pulses bilaterally.  Compartments in both legs are soft.  No calf tenderness or calf swelling.  Extremities warm and well perfused.  No redness, warmth, ecchymosis, soft tissue swelling to the right femoral area.  No other drainage.  Normal ROM in all joints; no deformity noted, no edema; no cyanosis SKIN:  Normal color for age and race; warm; no rash on exposed skin NEURO: Moves all extremities equally PSYCH: The patient's mood and manner are appropriate.       Patient gave verbal permission to utilize photo for medical documentation only. The image was not stored on any personal device.  ____________________________________________   LABS (all labs ordered are listed, but only abnormal results are displayed)  Labs Reviewed - No data to display ____________________________________________  EKG  none ____________________________________________  RADIOLOGY I, Milo Schreier, personally viewed and evaluated these images (plain radiographs) as part of my medical decision making, as well as reviewing the written report by the radiologist.  ED MD interpretation:  none  Official radiology report(s): PERIPHERAL VASCULAR CATHETERIZATION  Result Date: 02/10/2021 See op note   ____________________________________________   PROCEDURES    ____________________________________________   INITIAL IMPRESSION / ASSESSMENT AND PLAN / ED COURSE  As part of my medical decision making, I reviewed the following data within the [place] History obtained from family, Old chart reviewed and Notes from prior ED visits         Patient here with a small amount of bleeding from his recent surgical site.  No active hemorrhage.  Hemodynamically stable.  Will place TXA soaked gauze, pressure  bandage, sandbag to this area for 30 minutes and reassess.  Will then ambulate to ensure no recurrence of bleeding.  Labs January 2022 showed normal hemoglobin, normal platelets.  I do not feel this needs to be repeated today given he is not hemorrhaging.  ED PROGRESS  Patient has been monitored for 1 hour and has had no recurrent bleeding.  New gauze and pressure dressing placed on this area and there is no sign of bleeding through the gauze.  He has been able to ambulate without recurrence of bleeding.  Continues to be hemodynamically stable.  Discussed bleeding return precautions.  Patient and wife comfortable with plan.   At this time, I do not feel there is any life-threatening condition present. I have reviewed, interpreted and discussed all results (EKG, imaging, lab, urine as appropriate) and exam findings with patient/family. I have reviewed nursing notes and appropriate previous records.  I feel the patient is safe to be discharged home without further emergent workup and can continue workup as an outpatient as needed. Discussed usual and customary return precautions. Patient/family verbalize understanding and are comfortable with this plan.  Outpatient follow-up has been provided as needed. All questions have been answered.  ____________________________________________   FINAL CLINICAL IMPRESSION(S) / ED DIAGNOSES  Final diagnoses:  Bleeding from wound     ED Discharge Orders    None      *Please note:  Gilda Crease [name] was evaluated in Emergency Department on 02/11/2021 for the symptoms described in the history of present illness. He was evaluated in the context of the global COVID-19 pandemic, which necessitated consideration that the patient might be at risk for infection with the SARS-CoV-2 virus that causes COVID-19. Institutional protocols and algorithms that pertain to the evaluation of patients at risk for COVID-19 are in a state of rapid change based on information  released by regulatory bodies including the CDC and federal and state organizations. These policies and algorithms were followed during the patient's care in the ED.  Some ED evaluations and interventions may be delayed as a result of limited staffing during and the pandemic.*   Note:  This document was prepared using Dragon voice recognition software and may include unintentional  dictation errors.   Finnian Husted, Delice Bison, DO 02/11/21 [phone]

## 2021-03-07 ENCOUNTER — Other Ambulatory Visit (INDEPENDENT_AMBULATORY_CARE_PROVIDER_SITE_OTHER): Payer: Self-pay | Admitting: Vascular Surgery

## 2021-03-07 DIAGNOSIS — Z9862 Peripheral vascular angioplasty status: Secondary | ICD-10-CM

## 2021-03-07 DIAGNOSIS — I70223 Atherosclerosis of native arteries of extremities with rest pain, bilateral legs: Secondary | ICD-10-CM

## 2021-03-10 ENCOUNTER — Ambulatory Visit (INDEPENDENT_AMBULATORY_CARE_PROVIDER_SITE_OTHER): Payer: Medicare Other

## 2021-03-10 ENCOUNTER — Encounter (INDEPENDENT_AMBULATORY_CARE_PROVIDER_SITE_OTHER): Payer: Medicare Other

## 2021-03-10 ENCOUNTER — Ambulatory Visit (INDEPENDENT_AMBULATORY_CARE_PROVIDER_SITE_OTHER): Payer: Medicare Other | Admitting: Nurse Practitioner

## 2021-03-10 ENCOUNTER — Other Ambulatory Visit (INDEPENDENT_AMBULATORY_CARE_PROVIDER_SITE_OTHER): Payer: Self-pay | Admitting: Vascular Surgery

## 2021-03-10 ENCOUNTER — Other Ambulatory Visit: Payer: Self-pay

## 2021-03-10 DIAGNOSIS — Z9862 Peripheral vascular angioplasty status: Secondary | ICD-10-CM

## 2021-03-10 DIAGNOSIS — I70223 Atherosclerosis of native arteries of extremities with rest pain, bilateral legs: Secondary | ICD-10-CM

## 2021-03-13 ENCOUNTER — Encounter (INDEPENDENT_AMBULATORY_CARE_PROVIDER_SITE_OTHER): Payer: Self-pay | Admitting: *Deleted

## 2021-05-28 ENCOUNTER — Telehealth (INDEPENDENT_AMBULATORY_CARE_PROVIDER_SITE_OTHER): Payer: Self-pay | Admitting: Nurse Practitioner

## 2021-05-28 ENCOUNTER — Other Ambulatory Visit (INDEPENDENT_AMBULATORY_CARE_PROVIDER_SITE_OTHER): Payer: Self-pay | Admitting: Nurse Practitioner

## 2021-05-28 DIAGNOSIS — I739 Peripheral vascular disease, unspecified: Secondary | ICD-10-CM

## 2021-05-28 DIAGNOSIS — L819 Disorder of pigmentation, unspecified: Secondary | ICD-10-CM

## 2021-05-28 NOTE — Telephone Encounter (Signed)
Wife called stating that patients' left foot is red and swollen, she is worried about his circulation and would like for him to come in to be seen. Patient was last seen 03/10/21 lo. Vanice Sarah (was sent a letter of results) and has upcomming appt 06/11/21 with lle arterial & abi studies. Calling wife to move appt up.

## 2021-05-30 ENCOUNTER — Other Ambulatory Visit: Payer: Self-pay

## 2021-05-30 ENCOUNTER — Ambulatory Visit (INDEPENDENT_AMBULATORY_CARE_PROVIDER_SITE_OTHER): Payer: Medicare Other

## 2021-05-30 ENCOUNTER — Ambulatory Visit (INDEPENDENT_AMBULATORY_CARE_PROVIDER_SITE_OTHER): Payer: Medicare Other | Admitting: Nurse Practitioner

## 2021-05-30 VITALS — BP 127/70 | HR 62 | Ht 69.0 in | Wt 175.0 lb

## 2021-05-30 DIAGNOSIS — L819 Disorder of pigmentation, unspecified: Secondary | ICD-10-CM | POA: Diagnosis not present

## 2021-05-30 DIAGNOSIS — I7025 Atherosclerosis of native arteries of other extremities with ulceration: Secondary | ICD-10-CM

## 2021-05-30 DIAGNOSIS — I739 Peripheral vascular disease, unspecified: Secondary | ICD-10-CM | POA: Diagnosis not present

## 2021-05-30 DIAGNOSIS — I10 Essential (primary) hypertension: Secondary | ICD-10-CM

## 2021-05-30 DIAGNOSIS — E785 Hyperlipidemia, unspecified: Secondary | ICD-10-CM

## 2021-05-30 MED ORDER — DOXYCYCLINE HYCLATE 100 MG PO CAPS: 100.0000 mg | ORAL_CAPSULE | Freq: Two times a day (BID) | ORAL | 0 refills | Status: DC

## 2021-05-30 MED ORDER — TRAMADOL HCL 50 MG PO TABS: 50.0000 mg | ORAL_TABLET | Freq: Four times a day (QID) | ORAL | 0 refills | Status: DC | PRN

## 2021-06-08 ENCOUNTER — Encounter (INDEPENDENT_AMBULATORY_CARE_PROVIDER_SITE_OTHER): Payer: Self-pay | Admitting: Nurse Practitioner

## 2021-06-08 NOTE — Progress Notes (Signed)
Subjective:    Patient ID: Bobby Warner., male    DOB: 1941-11-01, 80 y.o.   MRN: [id number] Chief Complaint  Patient presents with   Follow-up    3 Mo F/U LLE Art duplex. ABI    Bobby Warner is a 80 year old male presents today for tenderness and redness of his left foot.  This occurred suddenly over the last several days.  The patient does have a history of gout he notes that it is difficult for him to flex his toes.  He denies any coldness.  He denies any wounds or ulcerations.  He denies any worsening claudication or rest pain like symptoms.  Today noninvasive studies show noncompressible ABIs on the right with an ABI of 1.18 on the left.  The patient has a TBI 0.40 on the right with 1 of 0.25 on the left.  These are fairly consistent with his previous studies done back on 03/10/2021.  The right lower extremity has biphasic triphasic waveforms throughout with monophasic waveforms at the distal anterior tibial and posterior tibial with biphasic at the peroneal.  The left lower extremity has biphasic/triphasic waveforms with monophasic waveforms at the distal anterior tibial and peroneal with biphasic waveforms at the distal posterior tibial   Review of Systems  Skin:  Positive for color change. Negative for wound.  All other systems reviewed and are negative.     Objective:   Physical Exam Vitals reviewed.  HENT:     Head: Normocephalic.  Cardiovascular:     Rate and Rhythm: Normal rate.     Pulses: Decreased pulses.  Pulmonary:     Effort: Pulmonary effort is normal.  Musculoskeletal:        General: Tenderness present.  Skin:    General: Skin is warm.     Capillary Refill: Capillary refill takes 2 to 3 seconds.     Findings: Erythema (Forefoot left) present.  Neurological:     Mental Status: He is alert and oriented to person, place, and time.  Psychiatric:        Mood and Affect: Mood normal.        Behavior: Behavior normal.        Thought Content: Thought  content normal.        Judgment: Judgment normal.    BP 127/70   Pulse 62   Ht $R'5\' 9"'yR$  (1.753 m)   Wt 175 lb (79.4 kg)   BMI 25.84 kg/m   Past Medical History:  Diagnosis Date   Arthritis    Back pain    Coronary artery disease    Diabetes mellitus without complication (HCC)    GERD (gastroesophageal reflux disease)    History of hiatal hernia    Hyperlipidemia    Hypertension    Sleep apnea     Social History   Socioeconomic History   Marital status: Married    Spouse name: Bobby Warner   Number of children: 1   Years of education: Not on file   Highest education level: Not on file  Occupational History   Not on file  Tobacco Use   Smoking status: Former   Smokeless tobacco: Never  Vaping Use   Vaping Use: Never used  Substance and Sexual Activity   Alcohol use: Yes    Alcohol/week: 1.0 standard drink    Types: 1 Cans of beer per week    Comment: occassional    Drug use: No   Sexual activity: Not on file  Other Topics  Concern   Not on file  Social History Narrative   Not on file   Social Determinants of Health   Financial Resource Strain: Not on file  Food Insecurity: Not on file  Transportation Needs: Not on file  Physical Activity: Not on file  Stress: Not on file  Social Connections: Not on file  Intimate Partner Violence: Not on file    Past Surgical History:  Procedure Laterality Date   APPENDECTOMY     CARDIAC CATHETERIZATION     CHOLECYSTECTOMY     COLONOSCOPY WITH ESOPHAGOGASTRODUODENOSCOPY (EGD)     COLONOSCOPY WITH PROPOFOL N/A 03/16/2016   Procedure: COLONOSCOPY WITH PROPOFOL;  Surgeon: Lollie Sails, MD;  Location: [facility] ENDOSCOPY;  Service: Endoscopy;  Laterality: N/A;   ESOPHAGOGASTRODUODENOSCOPY (EGD) WITH PROPOFOL N/A 02/28/2020   Procedure: ESOPHAGOGASTRODUODENOSCOPY (EGD) WITH PROPOFOL;  Surgeon: Toledo, Benay Pike, MD;  Location: [facility] ENDOSCOPY;  Service: Gastroenterology;  Laterality: N/A;   EYE SURGERY     HERNIA REPAIR      LOWER EXTREMITY ANGIOGRAPHY Left 10/09/2019   Procedure: LOWER EXTREMITY ANGIOGRAPHY;  Surgeon: Algernon Huxley, MD;  Location: [place] [facility];  Service: Cardiovascular;  Laterality: Left;   LOWER EXTREMITY ANGIOGRAPHY Left 02/10/2021   Procedure: LOWER EXTREMITY ANGIOGRAPHY;  Surgeon: Algernon Huxley, MD;  Location: [place] [facility];  Service: Cardiovascular;  Laterality: Left;   NISSEN FUNDOPLICATION     testicular vein surgery     TONSILLECTOMY     TOTAL KNEE ARTHROPLASTY      Family History  Problem Relation Age of Onset   Varicose Veins Neg Hx    Vision loss Neg Hx    Stroke Neg Hx     Allergies  Allergen Reactions   Nsaids    Tolmetin     CBC Latest Ref Rng & Units 04/18/2013 06/20/2012 06/20/2012  WBC 3.8 - 10.6 x10 3/mm 3 4.2 7.0 6.7  Hemoglobin 13.0 - 18.0 g/dL 13.4 13.4 13.6  Hematocrit 40.0 - 52.0 % 38.5(L) 38.8(L) 40.2  Platelets 150 - 440 x10 3/mm 3 162 112(L) 137(L)      CMP     Component Value Date/Time   NA 141 04/18/2013 0907   K 3.7 04/18/2013 0907   CL 105 04/18/2013 0907   CO2 30 04/18/2013 0907   GLUCOSE 159 (H) 04/18/2013 0907   BUN 30 (H) 02/10/2021 0940   BUN 8 04/18/2013 0907   CREATININE 1.47 (H) 02/10/2021 0940   CREATININE 1.08 04/18/2013 0907   CALCIUM 8.6 04/18/2013 0907   PROT 7.3 04/18/2013 0907   ALBUMIN 3.9 04/18/2013 0907   AST 24 04/18/2013 0907   ALT 29 04/18/2013 0907   ALKPHOS 54 04/18/2013 0907   BILITOT 0.6 04/18/2013 0907   GFRNONAA 48 (L) 02/10/2021 0940   GFRNONAA >60 04/18/2013 0907   GFRAA 56 (L) 10/09/2019 0939   GFRAA >60 04/18/2013 0907     VAS [place] ABI WITH/WO TBI  Result Date: 06/03/2021  LOWER EXTREMITY DOPPLER STUDY Patient Name:  Bobby RACKLEY [name]  Date of Exam:   05/30/2021 Medical Rec #: [id number]            Accession #:    [id number] Date of Birth: 1941-09-01           Patient Gender: M Patient Age:   84Y Exam Location:  [facility] Procedure:      VAS [place] ABI WITH/WO TBI Referring Phys:  [id number] [place] --------------------------------------------------------------------------------  Indications: Ulceration, and  peripheral artery disease.  Vascular Interventions: 10/09/2019 PTA of Lt prox ATA. Lt popliteal A, Lt PTA.                         Lt popliteal A stent.                         02/10/2021 PTA of ATA, Lt proximal popliteal A and dist.                         SFA. Comparison Study: 02/04/2021 Performing Technologist: Reece Agar RT (R)(VS)  Examination Guidelines: A complete evaluation includes at minimum, Doppler waveform signals and systolic blood pressure reading at the level of bilateral brachial, anterior tibial, and posterior tibial arteries, when vessel segments are accessible. Bilateral testing is considered an integral part of a complete examination. Photoelectric Plethysmograph (PPG) waveforms and toe systolic pressure readings are included as required and additional duplex testing as needed. Limited examinations for reoccurring indications may be performed as noted.  ABI Findings: +---------+------------------+-----+----------+--------+ Right    Rt Pressure (mmHg)IndexWaveform  Comment  +---------+------------------+-----+----------+--------+ Brachial 126                                       +---------+------------------+-----+----------+--------+ ATA                             monophasicNC       +---------+------------------+-----+----------+--------+ PTA                             monophasicNC       +---------+------------------+-----+----------+--------+ Great Toe52                0.40 Abnormal           +---------+------------------+-----+----------+--------+ +---------+------------------+-----+----------+-------+ Left     Lt Pressure (mmHg)IndexWaveform  Comment +---------+------------------+-----+----------+-------+ Brachial 130                                      +---------+------------------+-----+----------+-------+ ATA       153               1.18 monophasic        +---------+------------------+-----+----------+-------+ PTA      121               0.93 monophasic        +---------+------------------+-----+----------+-------+ Great Toe33                0.25 Abnormal          +---------+------------------+-----+----------+-------+ +-------+-----------+-----------+------------+------------+ ABI/TBIToday's ABIToday's TBIPrevious ABIPrevious TBI +-------+-----------+-----------+------------+------------+ Right  [facility]         .40        [place]          .39          +-------+-----------+-----------+------------+------------+ Left   1.18       .25        [place]          .31          +-------+-----------+-----------+------------+------------+ Bilateral ABIs appear essentially unchanged compared to prior study on 02/04/2021. Bilateral TBIs appear essentially unchanged compared to prior study on 02/04/2021.  Summary: Right: Resting right ankle-brachial index indicates noncompressible right lower extremity arteries. The right toe-brachial index is abnormal. Left: Resting left ankle-brachial index is within normal range. No evidence of significant left lower extremity arterial disease. The left toe-brachial index is abnormal.  *See table(s) above for measurements and observations.  Electronically signed by Leotis Pain MD on 06/03/2021 at 5:01:41 PM.    Final        Assessment & Plan:   1. Atherosclerosis of native arteries of the extremities with ulceration ([place]) The patient has a warm foot and no obvious evidence of ischemia.  It is possible that this pain and redness is from either cellulitis or a possible gout flare.  We will have the patient return in 2 to 3 weeks to evaluate progress with the pain and discomfort.  2. Hyperlipidemia, unspecified hyperlipidemia type Continue statin as ordered and reviewed, no changes at this time   3. HTN (hypertension), benign Continue antihypertensive medications as already  ordered, these medications have been reviewed and there are no changes at this time.    Current Outpatient Medications on File Prior to Visit  Medication Sig Dispense Refill   acetaminophen (TYLENOL) 500 MG tablet Take 1,000 mg by mouth every 6 (six) hours as needed.     albuterol (VENTOLIN HFA) 108 (90 Base) MCG/ACT inhaler Inhale into the lungs.     aspirin 81 MG tablet Take 81 mg by mouth daily.     atorvastatin (LIPITOR) 40 MG tablet Take 40 mg by mouth daily.     b complex vitamins capsule Take 1 capsule by mouth daily.     Blood Glucose Monitoring Suppl (ACCU-CHEK GUIDE ME) w/Device KIT See admin instructions.     Blood Glucose Monitoring Suppl (GLUCOCOM BLOOD GLUCOSE MONITOR) DEVI See admin instructions.     cetirizine (ZYRTEC) 10 MG chewable tablet Chew 10 mg by mouth daily.     Cetirizine HCl 10 MG CAPS Take by mouth.     clopidogrel (PLAVIX) 75 MG tablet Take 75 mg by mouth daily.     clopidogrel (PLAVIX) 75 MG tablet Take 1 tablet by mouth daily.     colchicine 0.6 MG tablet Take 0.6 mg by mouth 2 (two) times daily as needed.     Cysteamine Bitartrate (PROCYSBI) 300 MG PACK Use 1 each once daily Use as instructed.     Fluticasone-Salmeterol (ADVAIR) 250-50 MCG/DOSE AEPB Inhale into the lungs.     glimepiride (AMARYL) 2 MG tablet TAKE 1 TABLET BY MOUTH ONCE DAILY WITH BREAKFAST     JARDIANCE 10 MG TABS tablet TAKE 1 TABLET BY MOUTH ONCE DAILY WITH BREAKFAST     ketorolac (ACULAR) 0.4 % SOLN 1 drop 4 (four) times daily.     losartan (COZAAR) 100 MG tablet Take 100 mg by mouth daily.     losartan (COZAAR) 50 MG tablet Take 1 tablet by mouth daily.     metFORMIN (GLUCOPHAGE) 500 MG tablet Take 500 mg by mouth 2 (two) times daily.     metFORMIN (GLUCOPHAGE-XR) 500 MG 24 hr tablet Take 500 mg by mouth daily with breakfast.     methocarbamol (ROBAXIN) 500 MG tablet Take 500 mg by mouth daily.      methocarbamol (ROBAXIN) 500 MG tablet Take by mouth.     metoprolol succinate  (TOPROL-XL) 100 MG 24 hr tablet Take 100 mg by mouth daily. Take with or immediately following a meal.     omeprazole (PRILOSEC) 20 MG capsule Take 20 mg by  mouth daily.     tiZANidine (ZANAFLEX) 4 MG tablet Take 4 mg by mouth 3 (three) times daily as needed.     vitamin B-12 (CYANOCOBALAMIN) 1000 MCG tablet Take 1,000 mcg by mouth daily.     No current facility-administered medications on file prior to visit.    There are no Patient Instructions on file for this visit. No follow-ups on file.   Kris Hartmann, NP

## 2021-06-11 ENCOUNTER — Encounter (INDEPENDENT_AMBULATORY_CARE_PROVIDER_SITE_OTHER): Payer: Medicare Other

## 2021-06-11 ENCOUNTER — Ambulatory Visit (INDEPENDENT_AMBULATORY_CARE_PROVIDER_SITE_OTHER): Payer: Medicare Other | Admitting: Nurse Practitioner

## 2021-06-20 ENCOUNTER — Other Ambulatory Visit: Payer: Self-pay

## 2021-06-20 ENCOUNTER — Encounter (INDEPENDENT_AMBULATORY_CARE_PROVIDER_SITE_OTHER): Payer: Self-pay | Admitting: Vascular Surgery

## 2021-06-20 ENCOUNTER — Ambulatory Visit (INDEPENDENT_AMBULATORY_CARE_PROVIDER_SITE_OTHER): Payer: Medicare Other | Admitting: Vascular Surgery

## 2021-06-20 VITALS — BP 146/72 | HR 64 | Resp 16 | Wt 172.0 lb

## 2021-06-20 DIAGNOSIS — E1122 Type 2 diabetes mellitus with diabetic chronic kidney disease: Secondary | ICD-10-CM

## 2021-06-20 DIAGNOSIS — I7025 Atherosclerosis of native arteries of other extremities with ulceration: Secondary | ICD-10-CM

## 2021-06-20 DIAGNOSIS — I10 Essential (primary) hypertension: Secondary | ICD-10-CM | POA: Diagnosis not present

## 2021-06-20 DIAGNOSIS — N183 Chronic kidney disease, stage 3 unspecified: Secondary | ICD-10-CM

## 2021-06-20 NOTE — Progress Notes (Signed)
MRN : [id number]  Bobby WOLIN [name] is a 80 y.o. (August 08, 1941) male who presents with chief complaint of  Chief Complaint  Patient presents with   Follow-up    2 wk follow up  .  History of Present Illness: Patient returns today in follow up of his left foot.  It sounds like he had a gout flare because he got some steroids and other medicines for his primary care physician and it is much better today.  Its not swollen.  Is not painful.  He has no sores or ulceration.  His blood flow was checked few weeks ago and was good.  They are very happy today.  Current Outpatient Medications  Medication Sig Dispense Refill   acetaminophen (TYLENOL) 500 MG tablet Take 1,000 mg by mouth every 6 (six) hours as needed.     albuterol (VENTOLIN HFA) 108 (90 Base) MCG/ACT inhaler Inhale into the lungs.     aspirin 81 MG tablet Take 81 mg by mouth daily.     atorvastatin (LIPITOR) 40 MG tablet Take 40 mg by mouth daily.     b complex vitamins capsule Take 1 capsule by mouth daily.     Blood Glucose Monitoring Suppl (ACCU-CHEK GUIDE ME) w/Device KIT See admin instructions.     Blood Glucose Monitoring Suppl (GLUCOCOM BLOOD GLUCOSE MONITOR) DEVI See admin instructions.     cetirizine (ZYRTEC) 10 MG chewable tablet Chew 10 mg by mouth daily.     Cetirizine HCl 10 MG CAPS Take by mouth.     clopidogrel (PLAVIX) 75 MG tablet Take 75 mg by mouth daily.     clopidogrel (PLAVIX) 75 MG tablet Take 1 tablet by mouth daily.     colchicine 0.6 MG tablet Take 0.6 mg by mouth 2 (two) times daily as needed.     Cysteamine Bitartrate (PROCYSBI) 300 MG PACK Use 1 each once daily Use as instructed.     Fluticasone-Salmeterol (ADVAIR) 250-50 MCG/DOSE AEPB Inhale into the lungs.     glimepiride (AMARYL) 2 MG tablet TAKE 1 TABLET BY MOUTH ONCE DAILY WITH BREAKFAST     JARDIANCE 10 MG TABS tablet TAKE 1 TABLET BY MOUTH ONCE DAILY WITH BREAKFAST     ketorolac (ACULAR) 0.4 % SOLN 1 drop 4 (four) times daily.     losartan  (COZAAR) 100 MG tablet Take 100 mg by mouth daily.     losartan (COZAAR) 50 MG tablet Take 1 tablet by mouth daily.     metFORMIN (GLUCOPHAGE) 500 MG tablet Take 500 mg by mouth 2 (two) times daily.     metFORMIN (GLUCOPHAGE-XR) 500 MG 24 hr tablet Take 500 mg by mouth daily with breakfast.     methocarbamol (ROBAXIN) 500 MG tablet Take 500 mg by mouth daily.      methocarbamol (ROBAXIN) 500 MG tablet Take by mouth.     metoprolol succinate (TOPROL-XL) 100 MG 24 hr tablet Take 100 mg by mouth daily. Take with or immediately following a meal.     omeprazole (PRILOSEC) 20 MG capsule Take 20 mg by mouth daily.     tiZANidine (ZANAFLEX) 4 MG tablet Take 4 mg by mouth 3 (three) times daily as needed.     vitamin B-12 (CYANOCOBALAMIN) 1000 MCG tablet Take 1,000 mcg by mouth daily.     doxycycline (VIBRAMYCIN) 100 MG capsule Take 1 capsule (100 mg total) by mouth 2 (two) times daily. (Patient not taking: Reported on 06/20/2021) 10 capsule 0   traMADol (ULTRAM)  50 MG tablet Take 1 tablet (50 mg total) by mouth every 6 (six) hours as needed. (Patient not taking: Reported on 06/20/2021) 20 tablet 0   No current facility-administered medications for this visit.    Past Medical History:  Diagnosis Date   Arthritis    Back pain    Coronary artery disease    Diabetes mellitus without complication (HCC)    GERD (gastroesophageal reflux disease)    History of hiatal hernia    Hyperlipidemia    Hypertension    Sleep apnea     Past Surgical History:  Procedure Laterality Date   APPENDECTOMY     CARDIAC CATHETERIZATION     CHOLECYSTECTOMY     COLONOSCOPY WITH ESOPHAGOGASTRODUODENOSCOPY (EGD)     COLONOSCOPY WITH PROPOFOL N/A 03/16/2016   Procedure: COLONOSCOPY WITH PROPOFOL;  Surgeon: Lollie Sails, MD;  Location: [facility] ENDOSCOPY;  Service: Endoscopy;  Laterality: N/A;   ESOPHAGOGASTRODUODENOSCOPY (EGD) WITH PROPOFOL N/A 02/28/2020   Procedure: ESOPHAGOGASTRODUODENOSCOPY (EGD) WITH PROPOFOL;   Surgeon: Toledo, Benay Pike, MD;  Location: [facility] ENDOSCOPY;  Service: Gastroenterology;  Laterality: N/A;   EYE SURGERY     HERNIA REPAIR     LOWER EXTREMITY ANGIOGRAPHY Left 10/09/2019   Procedure: LOWER EXTREMITY ANGIOGRAPHY;  Surgeon: Algernon Huxley, MD;  Location: [place] [facility];  Service: Cardiovascular;  Laterality: Left;   LOWER EXTREMITY ANGIOGRAPHY Left 02/10/2021   Procedure: LOWER EXTREMITY ANGIOGRAPHY;  Surgeon: Algernon Huxley, MD;  Location: [place] [facility];  Service: Cardiovascular;  Laterality: Left;   NISSEN FUNDOPLICATION     testicular vein surgery     TONSILLECTOMY     TOTAL KNEE ARTHROPLASTY       Social History   Tobacco Use   Smoking status: Former   Smokeless tobacco: Never  Vaping Use   Vaping Use: Never used  Substance Use Topics   Alcohol use: Yes    Alcohol/week: 1.0 standard drink    Types: 1 Cans of beer per week    Comment: occassional    Drug use: No      Family History  Problem Relation Age of Onset   Varicose Veins Neg Hx    Vision loss Neg Hx    Stroke Neg Hx   No bleeding or clotting disorders  Allergies  Allergen Reactions   Nsaids    Tolmetin    REVIEW OF SYSTEMS (Negative unless checked)   Constitutional: $RemoveBeforeDE'[]'kWOcPxjZfeeytEc$ Weight loss  $Rem'[]'kxlr$ Fever  $Remo'[]'xtDmj$ Chills Cardiac: $RemoveBeforeD'[]'uBggPgeyQwiCNz$ Chest pain   '[]'$ Chest pressure   '[]'$ Palpitations   '[]'$ Shortness of breath when laying flat   '[]'$ Shortness of breath at rest   '[]'$ Shortness of breath with exertion. Vascular:  $RemoveBe'[]'LNebcqVLt$ Pain in legs with walking   '[]'$ Pain in legs at rest   '[]'$ Pain in legs when laying flat   '[]'$ Claudication   '[]'$ Pain in feet when walking  $Remove'[]'hhZIKIz$ Pain in feet at rest  $Rem'[]'ZBvs$ Pain in feet when laying flat   '[]'$ History of DVT   '[]'$ Phlebitis   '[]'$ Swelling in legs   '[]'$ Varicose veins   '[]'$ Non-healing ulcers Pulmonary:   '[]'$ Uses home oxygen   '[]'$ Productive cough   '[]'$ Hemoptysis   '[]'$ Wheeze  $Remov'[]'NtnWXS$ COPD   '[]'$ Asthma Neurologic:  $RemoveBefo'[]'PFLSZbywqAx$ Dizziness  $RemoveBe'[]'IJOBLVZRn$ Blackouts   '[]'$ Seizures   '[]'$ History of stroke   '[]'$ History of TIA  $Re'[]'DVg$ Aphasia   '[]'$ Temporary blindness    '[]'$ Dysphagia   '[]'$ Weakness or numbness in arms   '[]'$ Weakness or numbness in legs Musculoskeletal:  $RemoveBeforeDEI'[x]'ZPyvSalcUXdtLxaD$ Arthritis   '[]'$ Joint swelling   '[x]'$ Joint pain   '[x]'$ Low back pain  Hematologic:  [] Easy bruising  [] Easy bleeding   [] Hypercoagulable state   [] Anemic   Gastrointestinal:  [] Blood in stool   [] Vomiting blood  [x] Gastroesophageal reflux/heartburn   [] Abdominal pain Genitourinary:  [] Chronic kidney disease   [] Difficult urination  [] Frequent urination  [] Burning with urination   [] Hematuria Skin:  [] Rashes   [] Ulcers   [] Wounds Psychological:  [] History of anxiety   []  History of major depression.  Physical Examination  BP (!) 146/72 (BP Location: Right Arm)   Pulse 64   Resp 16   Wt 172 lb (78 kg)   BMI 25.40 kg/m  Gen:  WD/WN, NAD Head: [place]/AT, No temporalis wasting. Ear/Nose/Throat: Hearing grossly intact, nares w/o erythema or drainage Eyes: Conjunctiva clear. Sclera non-icteric Neck: Supple.  Trachea midline Pulmonary:  Good air movement, no use of accessory muscles.  Cardiac: RRR, no JVD Vascular:  Vessel Right Left  Radial Palpable Palpable                          PT 1+ Palpable 1+ Palpable  DP 2+ Palpable 2+ Palpable   Gastrointestinal: soft, non-tender/non-distended. No guarding/reflex.  Musculoskeletal: M/S 5/5 throughout.  No deformity or atrophy. No edema. Neurologic: Sensation grossly intact in extremities.  Symmetrical.  Speech is fluent.  Psychiatric: Judgment intact, Mood & affect appropriate for pt's clinical situation. Dermatologic: No rashes or ulcers noted.  No cellulitis or open wounds.      Labs No results found for this or any previous visit (from the past 2160 hour(s)).  Radiology VAS [place] ABI WITH/WO TBI  Result Date: 06/03/2021  LOWER EXTREMITY DOPPLER STUDY Patient Name:  Bobby CARAVEO [name]  Date of Exam:   05/30/2021 Medical Rec #: [id number]            Accession #:    [id number] Date of Birth: Oct 02, 1941           Patient Gender: M Patient Age:    23Y Exam Location:  [facility] Procedure:      VAS [place] ABI WITH/WO TBI Referring Phys: [id number] [place] --------------------------------------------------------------------------------  Indications: Ulceration, and peripheral artery disease.  Vascular Interventions: 10/09/2019 PTA of Lt prox ATA. Lt popliteal A, Lt PTA.                         Lt popliteal A stent.                         02/10/2021 PTA of ATA, Lt proximal popliteal A and dist.                         SFA. Comparison Study: 02/04/2021 Performing Technologist: Charlane Ferretti RT (R)(VS)  Examination Guidelines: A complete evaluation includes at minimum, Doppler waveform signals and systolic blood pressure reading at the level of bilateral brachial, anterior tibial, and posterior tibial arteries, when vessel segments are accessible. Bilateral testing is considered an integral part of a complete examination. Photoelectric Plethysmograph (PPG) waveforms and toe systolic pressure readings are included as required and additional duplex testing as needed. Limited examinations for reoccurring indications may be performed as noted.  ABI Findings: +---------+------------------+-----+----------+--------+ Right    Rt Pressure (mmHg)IndexWaveform  Comment  +---------+------------------+-----+----------+--------+ Brachial 126                                       +---------+------------------+-----+----------+--------+  ATA                             monophasicNC       +---------+------------------+-----+----------+--------+ PTA                             monophasicNC       +---------+------------------+-----+----------+--------+ Great Toe52                0.40 Abnormal           +---------+------------------+-----+----------+--------+ +---------+------------------+-----+----------+-------+ Left     Lt Pressure (mmHg)IndexWaveform  Comment +---------+------------------+-----+----------+-------+ Brachial 130                                       +---------+------------------+-----+----------+-------+ ATA      153               1.18 monophasic        +---------+------------------+-----+----------+-------+ PTA      121               0.93 monophasic        +---------+------------------+-----+----------+-------+ Great Toe33                0.25 Abnormal          +---------+------------------+-----+----------+-------+ +-------+-----------+-----------+------------+------------+ ABI/TBIToday's ABIToday's TBIPrevious ABIPrevious TBI +-------+-----------+-----------+------------+------------+ Right  [place]         .40        [place]          .39          +-------+-----------+-----------+------------+------------+ Left   1.18       .25        [place]          .31          +-------+-----------+-----------+------------+------------+ Bilateral ABIs appear essentially unchanged compared to prior study on 02/04/2021. Bilateral TBIs appear essentially unchanged compared to prior study on 02/04/2021.  Summary: Right: Resting right ankle-brachial index indicates noncompressible right lower extremity arteries. The right toe-brachial index is abnormal. Left: Resting left ankle-brachial index is within normal range. No evidence of significant left lower extremity arterial disease. The left toe-brachial index is abnormal.  *See table(s) above for measurements and observations.  Electronically signed by Leotis Pain MD on 06/03/2021 at 5:01:41 PM.    Final    VAS [place] LOWER EXTREMITY ARTERIAL DUPLEX  Result Date: 06/03/2021 LOWER EXTREMITY ARTERIAL DUPLEX STUDY Patient Name:  Bobby WINTON [name]  Date of Exam:   05/30/2021 Medical Rec #: [id number]            Accession #:    [id number] Date of Birth: [birth year]           Patient Gender: M Patient Age:   79Y Exam Location:  [facility] Procedure:      VAS [place] LOWER EXTREMITY ARTERIAL DUPLEX Referring Phys: [id number] [place]  --------------------------------------------------------------------------------   Current ABI: R =  Lt = 1.18 Comparison Study: 08/16/2019 Performing Technologist: Charlane Ferretti RT (R)(VS)  Examination Guidelines: A complete evaluation includes B-mode imaging, spectral Doppler, color Doppler, and power Doppler as needed of all accessible portions of each vessel. Bilateral testing is considered an integral part of a complete examination. Limited examinations for reoccurring indications may be performed as noted.  +-----------+--------+-----+--------+----------+--------+ RIGHT      PSV cm/sRatioStenosisWaveform  Comments +-----------+--------+-----+--------+----------+--------+ CFA Distal 134                  biphasic           +-----------+--------+-----+--------+----------+--------+ DFA        54                   biphasic           +-----------+--------+-----+--------+----------+--------+ SFA Prox   69                   triphasic          +-----------+--------+-----+--------+----------+--------+ SFA Mid    85                   triphasic          +-----------+--------+-----+--------+----------+--------+ SFA Distal 102                  triphasic          +-----------+--------+-----+--------+----------+--------+ POP Prox   104                  triphasic          +-----------+--------+-----+--------+----------+--------+ POP Distal 108                  triphasic          +-----------+--------+-----+--------+----------+--------+ ATA Distal 58                   monophasic         +-----------+--------+-----+--------+----------+--------+ PTA Distal 104                  monophasic         +-----------+--------+-----+--------+----------+--------+ PERO Distal50                   biphasic           +-----------+--------+-----+--------+----------+--------+  +-----------+--------+-----+--------+----------+--------+ LEFT       PSV  cm/sRatioStenosisWaveform  Comments +-----------+--------+-----+--------+----------+--------+ CFA Distal 117                  triphasic          +-----------+--------+-----+--------+----------+--------+ DFA        68                   biphasic           +-----------+--------+-----+--------+----------+--------+ SFA Prox   85                   biphasic           +-----------+--------+-----+--------+----------+--------+ SFA Mid    81                   biphasic           +-----------+--------+-----+--------+----------+--------+ SFA Distal 72                   biphasic           +-----------+--------+-----+--------+----------+--------+ POP Prox   72                   biphasic           +-----------+--------+-----+--------+----------+--------+ POP Distal 57                   biphasic           +-----------+--------+-----+--------+----------+--------+ ATA Distal 26  monophasic         +-----------+--------+-----+--------+----------+--------+ PTA Distal 79                   biphasic           +-----------+--------+-----+--------+----------+--------+ PERO Distal61                   monophasic         +-----------+--------+-----+--------+----------+--------+  Summary: Right: Near normal examination. Left: Near normal examination. Patent Lt popliteal stent. See table(s) above for measurements and observations. Electronically signed by Leotis Pain MD on 06/03/2021 at 5:01:44 PM.    Final     Assessment/Plan HTN (hypertension), benign blood pressure control important in reducing the progression of atherosclerotic disease. On appropriate oral medications.     Diabetes mellitus with stage 3 chronic kidney disease (HCC) blood glucose control important in reducing the progression of atherosclerotic disease. Also, involved in wound healing. On appropriate medications.     Renal failure, chronic, stage 3 (moderate) Limit contrast with  procedures requiring contrast and hydrate well if contrast is used.  No need for invasive procedures at this time.  Atherosclerosis of native arteries of the extremities with ulceration ([place]) His foot is much better and his flow was found to be good a couple of weeks ago.  At this point, we will plan a 55-month follow-up with noninvasive studies.    Leotis Pain, MD  06/20/2021 10:04 AM    This note was created with Dragon medical transcription system.  Any errors from dictation are purely unintentional

## 2021-06-20 NOTE — Assessment & Plan Note (Signed)
His foot is much better and his flow was found to be good a couple of weeks ago.  At this point, we will plan a 43-month follow-up with noninvasive studies.

## 2021-12-15 ENCOUNTER — Institutional Professional Consult (permissible substitution): Payer: PRIVATE HEALTH INSURANCE | Admitting: Pulmonary Disease

## 2021-12-19 ENCOUNTER — Other Ambulatory Visit: Payer: Self-pay

## 2021-12-19 ENCOUNTER — Ambulatory Visit (INDEPENDENT_AMBULATORY_CARE_PROVIDER_SITE_OTHER): Payer: Medicare Other | Admitting: Vascular Surgery

## 2021-12-19 ENCOUNTER — Ambulatory Visit (INDEPENDENT_AMBULATORY_CARE_PROVIDER_SITE_OTHER): Payer: Medicare Other

## 2021-12-19 VITALS — BP 164/63 | HR 65 | Ht 65.0 in | Wt 169.0 lb

## 2021-12-19 DIAGNOSIS — N183 Chronic kidney disease, stage 3 unspecified: Secondary | ICD-10-CM | POA: Diagnosis not present

## 2021-12-19 DIAGNOSIS — I7025 Atherosclerosis of native arteries of other extremities with ulceration: Secondary | ICD-10-CM | POA: Diagnosis not present

## 2021-12-19 DIAGNOSIS — I739 Peripheral vascular disease, unspecified: Secondary | ICD-10-CM

## 2021-12-19 DIAGNOSIS — I10 Essential (primary) hypertension: Secondary | ICD-10-CM | POA: Diagnosis not present

## 2021-12-19 DIAGNOSIS — E1122 Type 2 diabetes mellitus with diabetic chronic kidney disease: Secondary | ICD-10-CM | POA: Diagnosis not present

## 2021-12-19 NOTE — Progress Notes (Signed)
MRN : [id number]  Bobby KALLAM [name] is a 81 y.o. ([birth year]) male who presents with chief complaint of  Chief Complaint  Patient presents with   Follow-up    6 Mo ABI  .  History of Present Illness: Patient returns today in follow up of his PAD.  He has undergone left lower extremity revascularization twice, once about 2-1/2 years ago and the second time almost a year ago.  He is doing well.  He had a previous ulceration which is since healed after revascularization.  His ABIs today are 1.47 on the right and 1.28 on the left which are likely falsely elevated but he does have multiphasic waveforms.  His digit pressures are over 100 bilaterally.  Current Outpatient Medications  Medication Sig Dispense Refill   acetaminophen (TYLENOL) 500 MG tablet Take 1,000 mg by mouth every 6 (six) hours as needed.     aspirin 81 MG tablet Take 81 mg by mouth daily.     atorvastatin (LIPITOR) 40 MG tablet Take 40 mg by mouth daily.     b complex vitamins capsule Take 1 capsule by mouth daily.     Blood Glucose Monitoring Suppl (ACCU-CHEK GUIDE ME) w/Device KIT See admin instructions.     Blood Glucose Monitoring Suppl (GLUCOCOM BLOOD GLUCOSE MONITOR) DEVI See admin instructions.     cetirizine (ZYRTEC) 10 MG chewable tablet Chew 10 mg by mouth daily.     Cetirizine HCl 10 MG CAPS Take by mouth.     clopidogrel (PLAVIX) 75 MG tablet Take 75 mg by mouth daily.     clopidogrel (PLAVIX) 75 MG tablet Take 1 tablet by mouth daily.     colchicine 0.6 MG tablet Take 0.6 mg by mouth 2 (two) times daily as needed.     Cysteamine Bitartrate (PROCYSBI) 300 MG PACK Use 1 each once daily Use as instructed.     doxycycline (VIBRAMYCIN) 100 MG capsule Take 1 capsule (100 mg total) by mouth 2 (two) times daily. 10 capsule 0   glimepiride (AMARYL) 2 MG tablet TAKE 1 TABLET BY MOUTH ONCE DAILY WITH BREAKFAST     JARDIANCE 10 MG TABS tablet TAKE 1 TABLET BY MOUTH ONCE DAILY WITH BREAKFAST     ketorolac (ACULAR) 0.4 %  SOLN 1 drop 4 (four) times daily.     losartan (COZAAR) 100 MG tablet Take 100 mg by mouth daily.     losartan (COZAAR) 50 MG tablet Take 1 tablet by mouth daily.     metFORMIN (GLUCOPHAGE) 500 MG tablet Take 500 mg by mouth 2 (two) times daily.     metFORMIN (GLUCOPHAGE-XR) 500 MG 24 hr tablet Take 500 mg by mouth daily with breakfast.     methocarbamol (ROBAXIN) 500 MG tablet Take 500 mg by mouth daily.      methocarbamol (ROBAXIN) 500 MG tablet Take by mouth.     metoprolol succinate (TOPROL-XL) 100 MG 24 hr tablet Take 100 mg by mouth daily. Take with or immediately following a meal.     omeprazole (PRILOSEC) 20 MG capsule Take 20 mg by mouth daily.     tiZANidine (ZANAFLEX) 4 MG tablet Take 4 mg by mouth 3 (three) times daily as needed.     traMADol (ULTRAM) 50 MG tablet Take 1 tablet (50 mg total) by mouth every 6 (six) hours as needed. 20 tablet 0   vitamin B-12 (CYANOCOBALAMIN) 1000 MCG tablet Take 1,000 mcg by mouth daily.     albuterol (VENTOLIN HFA) 108 (  90 Base) MCG/ACT inhaler Inhale into the lungs.     Fluticasone-Salmeterol (ADVAIR) 250-50 MCG/DOSE AEPB Inhale into the lungs.     No current facility-administered medications for this visit.    Past Medical History:  Diagnosis Date   Arthritis    Back pain    Coronary artery disease    Diabetes mellitus without complication (HCC)    GERD (gastroesophageal reflux disease)    History of hiatal hernia    Hyperlipidemia    Hypertension    Sleep apnea     Past Surgical History:  Procedure Laterality Date   APPENDECTOMY     CARDIAC CATHETERIZATION     CHOLECYSTECTOMY     COLONOSCOPY WITH ESOPHAGOGASTRODUODENOSCOPY (EGD)     COLONOSCOPY WITH PROPOFOL N/A 03/16/2016   Procedure: COLONOSCOPY WITH PROPOFOL;  Surgeon: Lollie Sails, MD;  Location: [facility] ENDOSCOPY;  Service: Endoscopy;  Laterality: N/A;   ESOPHAGOGASTRODUODENOSCOPY (EGD) WITH PROPOFOL N/A 02/28/2020   Procedure: ESOPHAGOGASTRODUODENOSCOPY (EGD) WITH PROPOFOL;   Surgeon: Toledo, Benay Pike, MD;  Location: [facility] ENDOSCOPY;  Service: Gastroenterology;  Laterality: N/A;   EYE SURGERY     HERNIA REPAIR     LOWER EXTREMITY ANGIOGRAPHY Left 10/09/2019   Procedure: LOWER EXTREMITY ANGIOGRAPHY;  Surgeon: Algernon Huxley, MD;  Location: [place] CV LAB;  Service: Cardiovascular;  Laterality: Left;   LOWER EXTREMITY ANGIOGRAPHY Left 02/10/2021   Procedure: LOWER EXTREMITY ANGIOGRAPHY;  Surgeon: Algernon Huxley, MD;  Location: [place] [facility];  Service: Cardiovascular;  Laterality: Left;   NISSEN FUNDOPLICATION     testicular vein surgery     TONSILLECTOMY     TOTAL KNEE ARTHROPLASTY       Social History   Tobacco Use   Smoking status: Former   Smokeless tobacco: Never  Vaping Use   Vaping Use: Never used  Substance Use Topics   Alcohol use: Yes    Alcohol/week: 1.0 standard drink    Types: 1 Cans of beer per week    Comment: occassional    Drug use: No       Family History  Problem Relation Age of Onset   Varicose Veins Neg Hx    Vision loss Neg Hx    Stroke Neg Hx      Allergies  Allergen Reactions   Nsaids    Tolmetin     REVIEW OF SYSTEMS (Negative unless checked)   Constitutional: $RemoveBeforeDE'[]'bsqFIvDDcMXFCxh$ Weight loss  $Rem'[]'QXEK$ Fever  $Remo'[]'ZHaFU$ Chills Cardiac: $RemoveBeforeD'[]'fzdOyxjSWKQtGB$ Chest pain   '[]'$ Chest pressure   '[]'$ Palpitations   '[]'$ Shortness of breath when laying flat   '[]'$ Shortness of breath at rest   '[]'$ Shortness of breath with exertion. Vascular:  $RemoveBe'[]'UxRSTeVqm$ Pain in legs with walking   '[]'$ Pain in legs at rest   '[]'$ Pain in legs when laying flat   '[]'$ Claudication   '[]'$ Pain in feet when walking  $Remove'[]'KUshObf$ Pain in feet at rest  $Rem'[]'Tuna$ Pain in feet when laying flat   '[]'$ History of DVT   '[]'$ Phlebitis   '[]'$ Swelling in legs   '[]'$ Varicose veins   '[]'$ Non-healing ulcers Pulmonary:   '[]'$ Uses home oxygen   '[]'$ Productive cough   '[]'$ Hemoptysis   '[]'$ Wheeze  $Remov'[]'zROwWp$ COPD   '[]'$ Asthma Neurologic:  $RemoveBefo'[]'eyZzgzmoWHp$ Dizziness  $RemoveBe'[]'ZaWomEjkB$ Blackouts   '[]'$ Seizures   '[]'$ History of stroke   '[]'$ History of TIA  $Re'[]'KXf$ Aphasia   '[]'$ Temporary blindness   '[]'$ Dysphagia   '[]'$ Weakness or  numbness in arms   '[]'$ Weakness or numbness in legs Musculoskeletal:  $RemoveBeforeDEI'[x]'ahQsCjoHwBmlzMlR$ Arthritis   '[]'$ Joint swelling   '[x]'$ Joint pain   '[x]'$ Low back pain Hematologic:  $RemoveBefor'[]'hJytoOeziqhL$ Easy bruising  $RemoveB'[]'fLeUntXv$   Easy bleeding   [] Hypercoagulable state   [] Anemic   Gastrointestinal:  [] Blood in stool   [] Vomiting blood  [x] Gastroesophageal reflux/heartburn   [] Abdominal pain Genitourinary:  [] Chronic kidney disease   [] Difficult urination  [] Frequent urination  [] Burning with urination   [] Hematuria Skin:  [] Rashes   [] Ulcers   [] Wounds Psychological:  [] History of anxiety   []  History of major depression.  Physical Examination  BP (!) 164/63    Pulse 65    Ht 5\' 5"  (1.651 m)    Wt 169 lb (76.7 kg)    BMI 28.12 kg/m  Gen:  WD/WN, NAD. Appears younger than stated age. Head: /AT, No temporalis wasting. Ear/Nose/Throat: Hearing grossly intact, nares w/o erythema or drainage Eyes: Conjunctiva clear. Sclera non-icteric Neck: Supple.  Trachea midline Pulmonary:  Good air movement, no use of accessory muscles.  Cardiac: RRR, no JVD Vascular:  Vessel Right Left  Radial Palpable Palpable                          PT Palpable Palpable  DP Palpable Palpable   Gastrointestinal: soft, non-tender/non-distended. No guarding/reflex.  Musculoskeletal: M/S 5/5 throughout.  No deformity or atrophy. No edema. Neurologic: Sensation grossly intact in extremities.  Symmetrical.  Speech is fluent.  Psychiatric: Judgment intact, Mood & affect appropriate for pt's clinical situation. Dermatologic: No rashes or ulcers noted.  No cellulitis or open wounds.      Labs No results found for this or any previous visit (from the past 2160 hour(s)).  Radiology No results found.  Assessment/Plan HTN (hypertension), benign blood pressure control important in reducing the progression of atherosclerotic disease. On appropriate oral medications.     Diabetes mellitus with stage 3 chronic kidney disease (HCC) blood glucose control important in  reducing the progression of atherosclerotic disease. Also, involved in wound healing. On appropriate medications.     Renal failure, chronic, stage 3 (moderate) Limit contrast with procedures requiring contrast and hydrate well if contrast is used.  No need for invasive procedures at this time.  PVD (peripheral vascular disease) ([place]) His ABIs today are 1.47 on the right and 1.28 on the left which are likely falsely elevated but he does have multiphasic waveforms.  His digit pressures are over 100 bilaterally. No worrisome symptoms at current.  Recheck in 6 months with noninvasive studies.  Continue current medical regimen.    Leotis Pain, MD  12/19/2021 12:45 PM    This note was created with Dragon medical transcription system.  Any errors from dictation are purely unintentional

## 2021-12-19 NOTE — Assessment & Plan Note (Signed)
His ABIs today are 1.47 on the right and 1.28 on the left which are likely falsely elevated but he does have multiphasic waveforms.  His digit pressures are over 100 bilaterally. No worrisome symptoms at current.  Recheck in 6 months with noninvasive studies.  Continue current medical regimen.

## 2022-01-27 ENCOUNTER — Telehealth: Payer: Self-pay

## 2022-01-27 NOTE — Telephone Encounter (Signed)
Called and LVM in regards to upcoming appt, would like for patient to bring in SD card, compliance report is not up to date.  ?

## 2022-01-27 NOTE — Telephone Encounter (Signed)
Pt not using CPAP. Nothing further needed.  ?

## 2022-01-29 ENCOUNTER — Other Ambulatory Visit: Payer: Self-pay

## 2022-01-29 ENCOUNTER — Encounter: Payer: Self-pay | Admitting: Pulmonary Disease

## 2022-01-29 ENCOUNTER — Ambulatory Visit (INDEPENDENT_AMBULATORY_CARE_PROVIDER_SITE_OTHER): Payer: Medicare Other | Admitting: Pulmonary Disease

## 2022-01-29 VITALS — BP 142/68 | HR 71 | Temp 97.8°F | Ht 65.0 in | Wt 168.8 lb

## 2022-01-29 DIAGNOSIS — I7025 Atherosclerosis of native arteries of other extremities with ulceration: Secondary | ICD-10-CM | POA: Diagnosis not present

## 2022-01-29 DIAGNOSIS — G4733 Obstructive sleep apnea (adult) (pediatric): Secondary | ICD-10-CM

## 2022-01-29 NOTE — Progress Notes (Signed)
? ?[facility] ? ?Chief Complaint  ?Patient presents with  ? Follow-up  ?  Not currently wearing cpap-- c/o daytime sleepiness.   ? ? ?Past Surgical History:  ?He  has a past surgical history that includes Eye surgery; Total knee arthroplasty; Tonsillectomy; Appendectomy; Cardiac catheterization; Hernia repair; testicular vein surgery; Cholecystectomy; Nissen fundoplication; Colonoscopy with esophagogastroduodenoscopy (egd); Colonoscopy with propofol (N/A, 03/16/2016); Lower Extremity Angiography (Left, 10/09/2019); Esophagogastroduodenoscopy (egd) with propofol (N/A, 02/28/2020); and Lower Extremity Angiography (Left, 02/10/2021). ? ?Past Medical History:  ?OA, Back pain, CAD, DM type 2, GERD, Hiatal hernia, HLD, HTN ? ?Constitutional:  ?BP (!) 142/68 (BP Location: Right Arm, Cuff Size: Normal)   Pulse 71   Temp 97.8 ?F (36.6 ?C) (Temporal)   Ht $R'5\' 5"'Ft$  (1.651 m)   Wt 168 lb 12.8 oz (76.6 kg)   SpO2 100%   BMI 28.09 kg/m?  ? ?Brief Summary:  ?Bobby [facility]. is a 81 y.o. male former smoker with obstructive sleep apnea. ?  ? ? ? ?Subjective:  ? ?He is here with his wife. ? ?He was seen by Dr. Alva Garnet in August 2020. ? ?He had sleep study in 2019.  Showed moderate sleep apnea.  Started on auto CPAP.  Had trouble with mask and could only use for a few hours.  He did try mask refit at sleep lab.  Hasn't used CPAP for several years.  He is having more trouble with daytime alertness and memory.  He falls asleep whenever he sits.  He snores and gurgles at night.  Epworth score is 18 out of 24. ? ?Physical Exam:  ? ?Appearance - well kempt  ? ?ENMT - no sinus tenderness, no oral exudate, no LAN, Mallampati 3 airway, no stridor, wears dentures ? ?Respiratory - equal breath sounds bilaterally, no wheezing or rales ? ?CV - s1s2 regular rate and rhythm, no murmurs ? ?Ext - no clubbing, no edema ? ?Skin - no rashes ? ?Psych - normal mood and affect ?  ?Sleep Tests:  ?PSG 04/28/18 >>  AHI 28 ?CPAP titration 05/05/18 >> CPAP 13 cm H2O ? ?Social History:  ?He  reports that he quit smoking about 44 years ago. His smoking use included cigarettes. He has a 30.00 pack-year smoking history. He has never used smokeless tobacco. He reports current alcohol use of about 1.0 standard drink per week. He reports that he does not use drugs. ? ?Family History:  ?His family history is not on file. ?  ? ? ?Assessment/Plan:  ? ?Obstructive sleep apnea. ?- he had trouble with previous CPAP set up due to difficulty with mask fit ?- he has progression of daytime sleepiness and memory difficulties ?- he continues to have snoring, sleep disruption, daytime sleepiness ?- I am concerned he still has significant sleep apnea ?- will arrange for home sleep study to assess current status of sleep apnea ?- he is open to trying CPAP again if he still has sleep apnea ? ?Time Spent Involved in Patient Care on Day of Examination:  ?27 minutes ? ?Follow up:  ? ?Patient Instructions  ?Will arrange for home sleep study ?Will call to arrange for follow up after sleep study reviewed ? ? ?Medication List:  ? ?Allergies as of 01/29/2022   ? ?   Reactions  ? Nsaids   ? Tolmetin   ? ?  ? ?  ?Medication List  ?  ? ?  ? Accurate as of January 29, 2022 10:58 AM.  If you have any questions, ask your nurse or doctor.  ?  ?  ? ?  ? ?STOP taking these medications   ? ?doxycycline 100 MG capsule ?Commonly known as: VIBRAMYCIN ?Stopped by: [name], MD ?  ?Fluticasone-Salmeterol 250-50 MCG/DOSE Aepb ?Commonly known as: ADVAIR ?Stopped by: [name], MD ?  ? ?  ? ?TAKE these medications   ? ?Accu-Chek Guide Me w/Device Kit ?See admin instructions. ?  ?GlucoCom Blood Glucose Monitor Devi ?See admin instructions. ?  ?acetaminophen 500 MG tablet ?Commonly known as: TYLENOL ?Take 1,000 mg by mouth every 6 (six) hours as needed. ?  ?albuterol 108 (90 Base) MCG/ACT inhaler ?Commonly known as: VENTOLIN HFA ?Inhale into the lungs. ?  ?aspirin 81 MG  tablet ?Take 81 mg by mouth daily. ?  ?atorvastatin 40 MG tablet ?Commonly known as: LIPITOR ?Take 40 mg by mouth daily. ?  ?b complex vitamins capsule ?Take 1 capsule by mouth daily. ?  ?cetirizine 10 MG chewable tablet ?Commonly known as: ZYRTEC ?Chew 10 mg by mouth daily. ?  ?Cetirizine HCl 10 MG Caps ?Take by mouth. ?  ?clopidogrel 75 MG tablet ?Commonly known as: PLAVIX ?Take 1 tablet by mouth daily. ?  ?colchicine 0.6 MG tablet ?Take 0.6 mg by mouth 2 (two) times daily as needed. ?  ?glimepiride 2 MG tablet ?Commonly known as: AMARYL ?TAKE 1 TABLET BY MOUTH ONCE DAILY WITH BREAKFAST ?  ?Jardiance 10 MG Tabs tablet ?Generic drug: empagliflozin ?TAKE 1 TABLET BY MOUTH ONCE DAILY WITH BREAKFAST ?  ?ketorolac 0.4 % Soln ?Commonly known as: ACULAR ?1 drop 4 (four) times daily. ?  ?losartan 100 MG tablet ?Commonly known as: COZAAR ?Take 100 mg by mouth daily. ?  ?losartan 50 MG tablet ?Commonly known as: COZAAR ?Take 1 tablet by mouth daily. ?  ?metFORMIN 500 MG 24 hr tablet ?Commonly known as: GLUCOPHAGE-XR ?Take 500 mg by mouth daily with breakfast. ?  ?metFORMIN 500 MG tablet ?Commonly known as: GLUCOPHAGE ?Take 500 mg by mouth 2 (two) times daily. ?  ?methocarbamol 500 MG tablet ?Commonly known as: ROBAXIN ?Take by mouth. ?  ?metoprolol succinate 100 MG 24 hr tablet ?Commonly known as: TOPROL-XL ?Take 100 mg by mouth daily. Take with or immediately following a meal. ?  ?omeprazole 20 MG capsule ?Commonly known as: PRILOSEC ?Take 20 mg by mouth daily. ?  ?tiZANidine 4 MG tablet ?Commonly known as: ZANAFLEX ?Take 4 mg by mouth 3 (three) times daily as needed. ?  ?traMADol 50 MG tablet ?Commonly known as: ULTRAM ?Take 1 tablet (50 mg total) by mouth every 6 (six) hours as needed. ?  ?vitamin B-12 1000 MCG tablet ?Commonly known as: CYANOCOBALAMIN ?Take 1,000 mcg by mouth daily. ?  ? ?  ? ? ?Signature:  ?[name], MD ?[place] ?Pager - [phone] ?01/29/2022, 10:58 AM ?   ? ? ? ? ? ? ? ? ?

## 2022-01-29 NOTE — Patient Instructions (Signed)
Will arrange for home sleep study Will call to arrange for follow up after sleep study reviewed  

## 2022-03-05 ENCOUNTER — Ambulatory Visit: Payer: Medicare Other

## 2022-03-05 DIAGNOSIS — G4733 Obstructive sleep apnea (adult) (pediatric): Secondary | ICD-10-CM | POA: Diagnosis not present

## 2022-03-06 ENCOUNTER — Telehealth: Payer: Self-pay | Admitting: Pulmonary Disease

## 2022-03-06 DIAGNOSIS — G4733 Obstructive sleep apnea (adult) (pediatric): Secondary | ICD-10-CM | POA: Diagnosis not present

## 2022-03-06 NOTE — Telephone Encounter (Signed)
HST 03/05/22 >> AHI 28.4, SpO2 low 85% ? ? ?Please inform him that his sleep study shows moderate obstructive sleep apnea.  Please arrange for ROV with me or NP to discuss treatment options. ? ? ? ?

## 2022-03-09 NOTE — Telephone Encounter (Signed)
Called patient gave results to wife (ok per patient-verbally) and confirmed with two identifiers. Patients wife voiced understanding. Appt made for [place] first available. Nothing further needed.  ?

## 2022-04-30 ENCOUNTER — Encounter: Payer: Self-pay | Admitting: Pulmonary Disease

## 2022-04-30 ENCOUNTER — Ambulatory Visit (INDEPENDENT_AMBULATORY_CARE_PROVIDER_SITE_OTHER): Payer: Medicare Other | Admitting: Pulmonary Disease

## 2022-04-30 VITALS — BP 140/64 | HR 96 | Temp 97.6°F | Ht 70.0 in | Wt 170.4 lb

## 2022-04-30 DIAGNOSIS — G4733 Obstructive sleep apnea (adult) (pediatric): Secondary | ICD-10-CM | POA: Diagnosis not present

## 2022-04-30 DIAGNOSIS — I7025 Atherosclerosis of native arteries of other extremities with ulceration: Secondary | ICD-10-CM | POA: Diagnosis not present

## 2022-04-30 NOTE — Patient Instructions (Signed)
Will have [employer] arrange for a cloth CPAP mask so you can start using your CPAP machine again  Follow up in 2 months

## 2022-04-30 NOTE — Progress Notes (Signed)
[facility] [place] [facility]  Chief Complaint  Patient presents with   Follow-up    Review sleep study    Past Surgical History:  He  has a past surgical history that includes Eye surgery; Total knee arthroplasty; Tonsillectomy; Appendectomy; Cardiac catheterization; Hernia repair; testicular vein surgery; Cholecystectomy; Nissen fundoplication; Colonoscopy with esophagogastroduodenoscopy (egd); Colonoscopy with propofol (N/A, 03/16/2016); Lower Extremity Angiography (Left, 10/09/2019); Esophagogastroduodenoscopy (egd) with propofol (N/A, 02/28/2020); and Lower Extremity Angiography (Left, 02/10/2021).  Past Medical History:  OA, Back pain, CAD, DM type 2, GERD, Hiatal hernia, HLD, HTN  Constitutional:  BP 140/64 (BP Location: Left Arm, Cuff Size: Normal)   Pulse 96   Temp 97.6 F (36.4 C) (Temporal)   Ht _0  (1.778 m)   Wt 170 lb 6.4 oz (77.3 kg)   SpO2 98%   BMI 24.45 kg/m   Brief Summary:  Bobby Crease [name] is a 81 y.o. male former smoker with obstructive sleep apnea.      Subjective:   He is here with his wife.  Sleep study showed moderate sleep apnea.  He is still having trouble staying asleep and feeling sleepy during the day.  He still has his CPAP machine, but hasn't been able to wear this because of mask fit.  Physical Exam:   Appearance - well kempt   ENMT - no sinus tenderness, no oral exudate, no LAN, Mallampati 3 airway, no stridor, wears dentures  Respiratory - equal breath sounds bilaterally, no wheezing or rales  CV - s1s2 regular rate and rhythm, no murmurs  Ext - no clubbing, no edema  Skin - no rashes  Psych - normal mood and affect   Sleep Tests:  PSG 04/28/18 >> AHI 28 CPAP titration 05/05/18 >> CPAP 13 cm H2O HST 03/05/22 >> AHI 28.4, SpO2 low 85%   Social History:  He  reports that he quit smoking about 44 years ago. His smoking use included cigarettes. He has a 40.00 pack-year smoking history. He has never  used smokeless tobacco. He reports current alcohol use of about 1.0 standard drink of alcohol per week. He reports that he does not use drugs.  Family History:  His family history is not on file.     Assessment/Plan:   Obstructive sleep apnea. - reviewed his sleep study results - discussed how sleep apnea can impact his health - treatment options reviewed - he uses [employer] for his DME - will see if he can get a SleepWeaver cloth full face mask and then resume CPAP - if he still has difficulty, then he might need an in lab titration study - if he is not able to tolerate CPAP again, then he might need referral to ENT to assess for an Inspire device  Time Spent Involved in Patient Care on Day of Examination:  37 minutes  Follow up:   Patient Instructions  Will have [employer] arrange for a cloth CPAP mask so you can start using your CPAP machine again  Follow up in 2 months  Medication List:   Allergies as of 04/30/2022       Reactions   Nsaids    Tolmetin         Medication List        Accurate as of April 30, 2022 12:31 PM. If you have any questions, ask your nurse or doctor.          Accu-Chek Guide Me w/Device Kit See admin instructions.   GlucoCom Blood  Glucose Monitor Devi See admin instructions.   acetaminophen 500 MG tablet Commonly known as: TYLENOL Take 1,000 mg by mouth every 6 (six) hours as needed.   albuterol 108 (90 Base) MCG/ACT inhaler Commonly known as: VENTOLIN HFA Inhale into the lungs.   aspirin 81 MG tablet Take 81 mg by mouth daily.   atorvastatin 40 MG tablet Commonly known as: LIPITOR Take 40 mg by mouth daily.   b complex vitamins capsule Take 1 capsule by mouth daily.   cetirizine 10 MG chewable tablet Commonly known as: ZYRTEC Chew 10 mg by mouth daily.   Cetirizine HCl 10 MG Caps Take by mouth.   clopidogrel 75 MG tablet Commonly known as: PLAVIX Take 1 tablet by mouth daily.   colchicine 0.6 MG tablet Take 0.6 mg  by mouth 2 (two) times daily as needed.   glimepiride 2 MG tablet Commonly known as: AMARYL TAKE 1 TABLET BY MOUTH ONCE DAILY WITH BREAKFAST   Jardiance 10 MG Tabs tablet Generic drug: empagliflozin TAKE 1 TABLET BY MOUTH ONCE DAILY WITH BREAKFAST   ketorolac 0.4 % Soln Commonly known as: ACULAR 1 drop 4 (four) times daily.   losartan 100 MG tablet Commonly known as: COZAAR Take 100 mg by mouth daily.   losartan 50 MG tablet Commonly known as: COZAAR Take 1 tablet by mouth daily.   metFORMIN 500 MG 24 hr tablet Commonly known as: GLUCOPHAGE-XR Take 500 mg by mouth daily with breakfast.   methocarbamol 500 MG tablet Commonly known as: ROBAXIN Take by mouth.   metoprolol succinate 100 MG 24 hr tablet Commonly known as: TOPROL-XL Take 100 mg by mouth daily. Take with or immediately following a meal.   omeprazole 20 MG capsule Commonly known as: PRILOSEC Take 20 mg by mouth daily.   tiZANidine 4 MG tablet Commonly known as: ZANAFLEX Take 4 mg by mouth 3 (three) times daily as needed.   traMADol 50 MG tablet Commonly known as: ULTRAM Take 1 tablet (50 mg total) by mouth every 6 (six) hours as needed.   vitamin B-12 1000 MCG tablet Commonly known as: CYANOCOBALAMIN Take 1,000 mcg by mouth daily.        Signature:  [name], MD [place] Pager - [phone] 04/30/2022, 12:31 PM

## 2022-05-08 ENCOUNTER — Telehealth: Payer: Self-pay | Admitting: Pulmonary Disease

## 2022-05-08 NOTE — Telephone Encounter (Signed)
Patient's wife, Laurey Arrow stated that [employer] contacted her and scheduled an appt for mask fit. Nothing further needed.

## 2022-05-18 NOTE — Telephone Encounter (Signed)
Received verbal from patient to talk to wife, Laurey Arrow. She stated that [employer] does not carry cloth cpap mask.  Lm for Brad with [employer] to clarify.

## 2022-05-18 NOTE — Telephone Encounter (Signed)
Spoke to [place] with [employer].  He stated according to patient's account, cloth mask has been shipped to patient. Patient's spouse, Laurey Arrow is aware and voiced her understanding.  Nothing further needed.

## 2022-06-18 ENCOUNTER — Ambulatory Visit (INDEPENDENT_AMBULATORY_CARE_PROVIDER_SITE_OTHER): Payer: Medicare Other

## 2022-06-18 ENCOUNTER — Encounter (INDEPENDENT_AMBULATORY_CARE_PROVIDER_SITE_OTHER): Payer: Self-pay | Admitting: Nurse Practitioner

## 2022-06-18 ENCOUNTER — Ambulatory Visit (INDEPENDENT_AMBULATORY_CARE_PROVIDER_SITE_OTHER): Payer: Medicare Other | Admitting: Nurse Practitioner

## 2022-06-18 VITALS — BP 132/68 | HR 78 | Resp 16 | Wt 168.0 lb

## 2022-06-18 DIAGNOSIS — I7025 Atherosclerosis of native arteries of other extremities with ulceration: Secondary | ICD-10-CM | POA: Diagnosis not present

## 2022-06-18 DIAGNOSIS — I739 Peripheral vascular disease, unspecified: Secondary | ICD-10-CM | POA: Diagnosis not present

## 2022-06-18 DIAGNOSIS — N183 Chronic kidney disease, stage 3 unspecified: Secondary | ICD-10-CM | POA: Diagnosis not present

## 2022-06-18 DIAGNOSIS — I10 Essential (primary) hypertension: Secondary | ICD-10-CM | POA: Diagnosis not present

## 2022-06-18 DIAGNOSIS — E1122 Type 2 diabetes mellitus with diabetic chronic kidney disease: Secondary | ICD-10-CM

## 2022-06-20 ENCOUNTER — Encounter (INDEPENDENT_AMBULATORY_CARE_PROVIDER_SITE_OTHER): Payer: Self-pay | Admitting: Nurse Practitioner

## 2022-06-20 NOTE — Progress Notes (Signed)
Subjective:    Patient ID: Bobby Harps., male    DOB: Jun 10, 1941, 81 y.o.   MRN: [id number] Chief Complaint  Patient presents with   Follow-up    Ultrasound follow up    The patient returns to the office for followup and review of the noninvasive studies.   There have been no interval changes in lower extremity symptoms. No interval shortening of the patient's claudication distance or development of rest pain symptoms. No new ulcers or wounds have occurred since the last visit.  There have been no significant changes to the patient's overall health care.  The patient denies amaurosis fugax or recent TIA symptoms. There are no documented recent neurological changes noted. There is no history of DVT, PE or superficial thrombophlebitis. The patient denies recent episodes of angina or shortness of breath.   The patient has noncompressible ABIs bilaterally with a TBI of 1.05 on the right and 0.91 on the left.  The patient has multiphasic tibial artery waveforms bilaterally with normal toe waveforms bilaterally.    Review of Systems  Skin:  Negative for wound.  All other systems reviewed and are negative.      Objective:   Physical Exam Vitals reviewed.  HENT:     Head: Normocephalic.  Cardiovascular:     Rate and Rhythm: Normal rate.     Pulses:          Dorsalis pedis pulses are detected w/ Doppler on the right side and detected w/ Doppler on the left side.       Posterior tibial pulses are detected w/ Doppler on the right side and detected w/ Doppler on the left side.  Pulmonary:     Effort: Pulmonary effort is normal.  Skin:    General: Skin is warm and dry.  Neurological:     Mental Status: He is alert and oriented to person, place, and time.  Psychiatric:        Mood and Affect: Mood normal.        Behavior: Behavior normal.        Thought Content: Thought content normal.        Judgment: Judgment normal.     BP 132/68 (BP Location: Left Arm)   Pulse 78    Resp 16   Wt 168 lb (76.2 kg)   BMI 24.11 kg/m   Past Medical History:  Diagnosis Date   Arthritis    Back pain    Coronary artery disease    Diabetes mellitus without complication (HCC)    GERD (gastroesophageal reflux disease)    History of hiatal hernia    Hyperlipidemia    Hypertension    Sleep apnea     Social History   Socioeconomic History   Marital status: Married    Spouse name: Bobby Warner   Number of children: 1   Years of education: Not on file   Highest education level: Not on file  Occupational History   Not on file  Tobacco Use   Smoking status: Former    Packs/day: 2.00    Years: 20.00    Total pack years: 40.00    Types: Cigarettes    Quit date: 1979    Years since quitting: 44.6   Smokeless tobacco: Never  Vaping Use   Vaping Use: Never used  Substance and Sexual Activity   Alcohol use: Yes    Alcohol/week: 1.0 standard drink of alcohol    Types: 1 Cans of beer per week  Comment: occassional    Drug use: No   Sexual activity: Not on file  Other Topics Concern   Not on file  Social History Narrative   Not on file   Social Determinants of Health   Financial Resource Strain: Not on file  Food Insecurity: Not on file  Transportation Needs: Not on file  Physical Activity: Not on file  Stress: Not on file  Social Connections: Not on file  Intimate Partner Violence: Not on file    Past Surgical History:  Procedure Laterality Date   APPENDECTOMY     CARDIAC CATHETERIZATION     CHOLECYSTECTOMY     COLONOSCOPY WITH ESOPHAGOGASTRODUODENOSCOPY (EGD)     COLONOSCOPY WITH PROPOFOL N/A 03/16/2016   Procedure: COLONOSCOPY WITH PROPOFOL;  Surgeon: Lollie Sails, MD;  Location: [facility] ENDOSCOPY;  Service: Endoscopy;  Laterality: N/A;   ESOPHAGOGASTRODUODENOSCOPY (EGD) WITH PROPOFOL N/A 02/28/2020   Procedure: ESOPHAGOGASTRODUODENOSCOPY (EGD) WITH PROPOFOL;  Surgeon: Toledo, Benay Pike, MD;  Location: [facility] ENDOSCOPY;  Service: Gastroenterology;   Laterality: N/A;   EYE SURGERY     HERNIA REPAIR     LOWER EXTREMITY ANGIOGRAPHY Left 10/09/2019   Procedure: LOWER EXTREMITY ANGIOGRAPHY;  Surgeon: Algernon Huxley, MD;  Location: [place] [facility];  Service: Cardiovascular;  Laterality: Left;   LOWER EXTREMITY ANGIOGRAPHY Left 02/10/2021   Procedure: LOWER EXTREMITY ANGIOGRAPHY;  Surgeon: Algernon Huxley, MD;  Location: [place] [facility];  Service: Cardiovascular;  Laterality: Left;   NISSEN FUNDOPLICATION     testicular vein surgery     TONSILLECTOMY     TOTAL KNEE ARTHROPLASTY      Family History  Problem Relation Age of Onset   Varicose Veins Neg Hx    Vision loss Neg Hx    Stroke Neg Hx     Allergies  Allergen Reactions   Nsaids    Tolmetin        Latest Ref Rng & Units 04/18/2013    9:07 AM 06/20/2012   11:46 PM 06/20/2012    7:26 PM  CBC  WBC 3.8 - 10.6 x10 3/mm 3 4.2  7.0  6.7   Hemoglobin 13.0 - 18.0 g/dL 13.4  13.4  13.6   Hematocrit 40.0 - 52.0 % 38.5  38.8  40.2   Platelets 150 - 440 x10 3/mm 3 162  112  137       CMP     Component Value Date/Time   NA 141 04/18/2013 0907   K 3.7 04/18/2013 0907   CL 105 04/18/2013 0907   CO2 30 04/18/2013 0907   GLUCOSE 159 (H) 04/18/2013 0907   BUN 30 (H) 02/10/2021 0940   BUN 8 04/18/2013 0907   CREATININE 1.47 (H) 02/10/2021 0940   CREATININE 1.08 04/18/2013 0907   CALCIUM 8.6 04/18/2013 0907   PROT 7.3 04/18/2013 0907   ALBUMIN 3.9 04/18/2013 0907   AST 24 04/18/2013 0907   ALT 29 04/18/2013 0907   ALKPHOS 54 04/18/2013 0907   BILITOT 0.6 04/18/2013 0907   GFRNONAA 48 (L) 02/10/2021 0940   GFRNONAA >60 04/18/2013 0907   GFRAA 56 (L) 10/09/2019 0939   GFRAA >60 04/18/2013 0907     No results found.     Assessment & Plan:   1. Atherosclerosis of native arteries of the extremities with ulceration ([place])  Recommend:  The patient has evidence of atherosclerosis of the lower extremities with claudication.  The patient does not voice lifestyle limiting  changes at this point in time.  Noninvasive studies do not suggest clinically significant change.  No invasive studies, angiography or surgery at this time The patient should continue walking and begin a more formal exercise program.  The patient should continue antiplatelet therapy and aggressive treatment of the lipid abnormalities  No changes in the patient's medications at this time  Continued surveillance is indicated as atherosclerosis is likely to progress with time.    The patient will continue follow up with noninvasive studies as ordered.     2. HTN (hypertension), benign Continue antihypertensive medications as already ordered, these medications have been reviewed and there are no changes at this time.   3. Diabetes mellitus with stage 3 chronic kidney disease ([place]) Continue hypoglycemic medications as already ordered, these medications have been reviewed and there are no changes at this time.  Hgb A1C to be monitored as already arranged by primary service    Current Outpatient Medications on File Prior to Visit  Medication Sig Dispense Refill   acetaminophen (TYLENOL) 500 MG tablet Take 1,000 mg by mouth every 6 (six) hours as needed.     albuterol (VENTOLIN HFA) 108 (90 Base) MCG/ACT inhaler Inhale into the lungs.     aspirin 81 MG tablet Take 81 mg by mouth daily.     atorvastatin (LIPITOR) 40 MG tablet Take 40 mg by mouth daily.     b complex vitamins capsule Take 1 capsule by mouth daily.     Blood Glucose Monitoring Suppl (ACCU-CHEK GUIDE ME) w/Device KIT See admin instructions.     Blood Glucose Monitoring Suppl (GLUCOCOM BLOOD GLUCOSE MONITOR) DEVI See admin instructions.     cetirizine (ZYRTEC) 10 MG chewable tablet Chew 10 mg by mouth daily.     Cetirizine HCl 10 MG CAPS Take by mouth.     clopidogrel (PLAVIX) 75 MG tablet Take 1 tablet by mouth daily.     colchicine 0.6 MG tablet Take 0.6 mg by mouth 2 (two) times daily as needed.     glimepiride (AMARYL) 2  MG tablet TAKE 1 TABLET BY MOUTH ONCE DAILY WITH BREAKFAST     JARDIANCE 10 MG TABS tablet TAKE 1 TABLET BY MOUTH ONCE DAILY WITH BREAKFAST     ketorolac (ACULAR) 0.4 % SOLN 1 drop 4 (four) times daily.     metFORMIN (GLUCOPHAGE-XR) 500 MG 24 hr tablet Take 500 mg by mouth daily with breakfast.     methocarbamol (ROBAXIN) 500 MG tablet Take by mouth.     metoprolol succinate (TOPROL-XL) 100 MG 24 hr tablet Take 100 mg by mouth daily. Take with or immediately following a meal.     omeprazole (PRILOSEC) 20 MG capsule Take 20 mg by mouth daily.     tiZANidine (ZANAFLEX) 4 MG tablet Take 4 mg by mouth 3 (three) times daily as needed.     traMADol (ULTRAM) 50 MG tablet Take 1 tablet (50 mg total) by mouth every 6 (six) hours as needed. 20 tablet 0   vitamin B-12 (CYANOCOBALAMIN) 1000 MCG tablet Take 1,000 mcg by mouth daily.     losartan (COZAAR) 100 MG tablet Take 100 mg by mouth daily. (Patient not taking: Reported on 06/18/2022)     losartan (COZAAR) 50 MG tablet Take 1 tablet by mouth daily.     No current facility-administered medications on file prior to visit.    There are no Patient Instructions on file for this visit. No follow-ups on file.   Kris Hartmann, NP

## 2022-07-10 ENCOUNTER — Ambulatory Visit: Payer: Medicare Other | Admitting: Primary Care

## 2022-11-13 ENCOUNTER — Ambulatory Visit (INDEPENDENT_AMBULATORY_CARE_PROVIDER_SITE_OTHER): Payer: Medicare Other | Admitting: Pulmonary Disease

## 2022-11-13 ENCOUNTER — Encounter: Payer: Self-pay | Admitting: Pulmonary Disease

## 2022-11-13 VITALS — BP 140/60 | HR 87 | Temp 97.7°F | Ht 70.0 in | Wt 172.2 lb

## 2022-11-13 DIAGNOSIS — G4733 Obstructive sleep apnea (adult) (pediatric): Secondary | ICD-10-CM

## 2022-11-13 NOTE — Progress Notes (Signed)
[facility]  Chief Complaint  Patient presents with   Follow-up    Doing well with cpap.     Past Surgical History:  He  has a past surgical history that includes Eye surgery; Total knee arthroplasty; Tonsillectomy; Appendectomy; Cardiac catheterization; Hernia repair; testicular vein surgery; Cholecystectomy; Nissen fundoplication; Colonoscopy with esophagogastroduodenoscopy (egd); Colonoscopy with propofol (N/A, 03/16/2016); Lower Extremity Angiography (Left, 10/09/2019); Esophagogastroduodenoscopy (egd) with propofol (N/A, 02/28/2020); and Lower Extremity Angiography (Left, 02/10/2021).  Past Medical History:  OA, Back pain, CAD, DM type 2, GERD, Hiatal hernia, HLD, HTN  Constitutional:  BP (!) 140/60 (BP Location: Right Arm, Cuff Size: Normal)   Pulse 87   Temp 97.7 F (36.5 C) (Temporal)   Ht 5\' 10"  (1.778 m)   Wt 172 lb 3.2 oz (78.1 kg)   SpO2 96%   BMI 24.71 kg/m   Brief Summary:  Bobby Crease [name] is a 82 y.o. male former smoker with obstructive sleep apnea.      Subjective:   He is here with his wife.  He is doing better with CPAP since he got a new mask.  Pressure setting is okay.  Gets dry mouth at night.  He wakes up after 4 to 5 hours and goes to the bathroom.  He takes his mask off, and doesn't usually put it back on.  He sleeps for another 2 hours.  He is still feeling sleepy at times during the day.  Physical Exam:   Appearance - well kempt   ENMT - no sinus tenderness, no oral exudate, no LAN, Mallampati 3 airway, no stridor, wears dentures  Respiratory - equal breath sounds bilaterally, no wheezing or rales  CV - s1s2 regular rate and rhythm, no murmurs  Ext - no clubbing, no edema  Skin - no rashes  Psych - normal mood and affect    Sleep Tests:  PSG 04/28/18 >> AHI 28 CPAP titration 05/05/18 >> CPAP 13 cm H2O HST 03/05/22 >> AHI 28.4, SpO2 low 85% Auto CPAP 10/13/22 to 11/11/22 >> used on 29 of 30  nights with average 4 hrs 17 min.  Average AHI 5.6 with median CPAP 12 and 95 th percentile CPAP 14 cm H2O   Social History:  He  reports that he quit smoking about 45 years ago. His smoking use included cigarettes. He has a 40.00 pack-year smoking history. He has never used smokeless tobacco. He reports current alcohol use of about 1.0 standard drink of alcohol per week. He reports that he does not use drugs.  Family History:  His family history is not on file.     Assessment/Plan:   Obstructive sleep apnea. - he is compliant with CPAP and reports benefit from therapy - he uses [employer] for his DME - continue auto CPAP 5 to 15 cm H2O - advised him to use his CPAP for the entire time he is asleep to get maximum benefit from therapy  Time Spent Involved in Patient Care on Day of Examination:  18 minutes  Follow up:   Patient Instructions  Follow up in 1 year  Medication List:   Allergies as of 11/13/2022       Reactions   Nsaids    Tolmetin         Medication List        Accurate as of November 13, 2022 11:30 AM. If you have any questions, ask your nurse or doctor.  Accu-Chek Guide Me w/Device Kit See admin instructions.   GlucoCom Blood Glucose Monitor Devi See admin instructions.   acetaminophen 500 MG tablet Commonly known as: TYLENOL Take 1,000 mg by mouth every 6 (six) hours as needed.   albuterol 108 (90 Base) MCG/ACT inhaler Commonly known as: VENTOLIN HFA Inhale into the lungs.   aspirin 81 MG tablet Take 81 mg by mouth daily.   atorvastatin 40 MG tablet Commonly known as: LIPITOR Take 40 mg by mouth daily.   b complex vitamins capsule Take 1 capsule by mouth daily.   cetirizine 10 MG chewable tablet Commonly known as: ZYRTEC Chew 10 mg by mouth daily.   Cetirizine HCl 10 MG Caps Take by mouth.   clopidogrel 75 MG tablet Commonly known as: PLAVIX Take 1 tablet by mouth daily.   colchicine 0.6 MG tablet Take 0.6 mg by mouth 2  (two) times daily as needed.   cyanocobalamin 1000 MCG tablet Commonly known as: VITAMIN B12 Take 1,000 mcg by mouth daily.   glimepiride 2 MG tablet Commonly known as: AMARYL TAKE 1 TABLET BY MOUTH ONCE DAILY WITH BREAKFAST   Jardiance 10 MG Tabs tablet Generic drug: empagliflozin TAKE 1 TABLET BY MOUTH ONCE DAILY WITH BREAKFAST   ketorolac 0.4 % Soln Commonly known as: ACULAR 1 drop 4 (four) times daily.   losartan 100 MG tablet Commonly known as: COZAAR Take 100 mg by mouth daily.   losartan 50 MG tablet Commonly known as: COZAAR Take 1 tablet by mouth daily.   metFORMIN 500 MG 24 hr tablet Commonly known as: GLUCOPHAGE-XR Take 500 mg by mouth daily with breakfast.   methocarbamol 500 MG tablet Commonly known as: ROBAXIN Take by mouth.   metoprolol succinate 100 MG 24 hr tablet Commonly known as: TOPROL-XL Take 100 mg by mouth daily. Take with or immediately following a meal.   omeprazole 20 MG capsule Commonly known as: PRILOSEC Take 20 mg by mouth daily.   tiZANidine 4 MG tablet Commonly known as: ZANAFLEX Take 4 mg by mouth 3 (three) times daily as needed.   traMADol 50 MG tablet Commonly known as: ULTRAM Take 1 tablet (50 mg total) by mouth every 6 (six) hours as needed.        Signature:  [name], MD [place] Pager - [phone] 11/13/2022, 11:30 AM

## 2022-11-13 NOTE — Patient Instructions (Signed)
Follow up in 1 year.

## 2022-12-21 ENCOUNTER — Other Ambulatory Visit (INDEPENDENT_AMBULATORY_CARE_PROVIDER_SITE_OTHER): Payer: Self-pay | Admitting: Nurse Practitioner

## 2022-12-21 DIAGNOSIS — I739 Peripheral vascular disease, unspecified: Secondary | ICD-10-CM

## 2022-12-22 ENCOUNTER — Encounter (INDEPENDENT_AMBULATORY_CARE_PROVIDER_SITE_OTHER): Payer: Self-pay | Admitting: Vascular Surgery

## 2022-12-22 ENCOUNTER — Ambulatory Visit (INDEPENDENT_AMBULATORY_CARE_PROVIDER_SITE_OTHER): Payer: Medicare Other

## 2022-12-22 ENCOUNTER — Ambulatory Visit (INDEPENDENT_AMBULATORY_CARE_PROVIDER_SITE_OTHER): Payer: Medicare Other | Admitting: Vascular Surgery

## 2022-12-22 VITALS — BP 162/70 | HR 61 | Ht 70.0 in | Wt 172.0 lb

## 2022-12-22 DIAGNOSIS — N183 Chronic kidney disease, stage 3 unspecified: Secondary | ICD-10-CM | POA: Diagnosis not present

## 2022-12-22 DIAGNOSIS — I739 Peripheral vascular disease, unspecified: Secondary | ICD-10-CM

## 2022-12-22 DIAGNOSIS — E1122 Type 2 diabetes mellitus with diabetic chronic kidney disease: Secondary | ICD-10-CM | POA: Diagnosis not present

## 2022-12-22 DIAGNOSIS — I10 Essential (primary) hypertension: Secondary | ICD-10-CM

## 2022-12-22 DIAGNOSIS — Z9889 Other specified postprocedural states: Secondary | ICD-10-CM

## 2022-12-22 NOTE — Progress Notes (Signed)
MRN : [id number]  Bobby HANCOX [name] is a 82 y.o. ([birth year]) male who presents with chief complaint of  Chief Complaint  Patient presents with   Follow-up    6 month follow up with ABI  .  History of Present Illness: Patient returns today in follow up of his PAD.  He has had 2 previous left lower extremity revascularizations and seems to be doing well from this.  Currently, his legs are doing well and he does not have any rest pain, ulceration, or lifestyle limiting claudication.  His ABIs today remain noncompressible, but his digit pressures are in the normal range on each side and he has multiphasic waveforms bilaterally.  Current Outpatient Medications  Medication Sig Dispense Refill   acetaminophen (TYLENOL) 500 MG tablet Take 1,000 mg by mouth every 6 (six) hours as needed.     albuterol (VENTOLIN HFA) 108 (90 Base) MCG/ACT inhaler Inhale into the lungs.     aspirin 81 MG tablet Take 81 mg by mouth daily.     atorvastatin (LIPITOR) 40 MG tablet Take 40 mg by mouth daily.     b complex vitamins capsule Take 1 capsule by mouth daily.     Blood Glucose Monitoring Suppl (ACCU-CHEK GUIDE ME) w/Device KIT See admin instructions.     Blood Glucose Monitoring Suppl (GLUCOCOM BLOOD GLUCOSE MONITOR) DEVI See admin instructions.     cetirizine (ZYRTEC) 10 MG chewable tablet Chew 10 mg by mouth daily.     Cetirizine HCl 10 MG CAPS Take by mouth.     clopidogrel (PLAVIX) 75 MG tablet Take 1 tablet by mouth daily.     colchicine 0.6 MG tablet Take 0.6 mg by mouth 2 (two) times daily as needed.     glimepiride (AMARYL) 2 MG tablet TAKE 1 TABLET BY MOUTH ONCE DAILY WITH BREAKFAST     ketorolac (ACULAR) 0.4 % SOLN 1 drop 4 (four) times daily.     losartan (COZAAR) 100 MG tablet Take 100 mg by mouth daily.     losartan (COZAAR) 50 MG tablet Take 1 tablet by mouth daily.     metFORMIN (GLUCOPHAGE-XR) 500 MG 24 hr tablet Take 500 mg by mouth daily with breakfast.     metoprolol succinate  (TOPROL-XL) 100 MG 24 hr tablet Take 100 mg by mouth daily. Take with or immediately following a meal.     omeprazole (PRILOSEC) 20 MG capsule Take 20 mg by mouth daily.     vitamin B-12 (CYANOCOBALAMIN) 1000 MCG tablet Take 1,000 mcg by mouth daily.     No current facility-administered medications for this visit.    Past Medical History:  Diagnosis Date   Arthritis    Back pain    Coronary artery disease    Diabetes mellitus without complication (HCC)    GERD (gastroesophageal reflux disease)    History of hiatal hernia    Hyperlipidemia    Hypertension    Sleep apnea     Past Surgical History:  Procedure Laterality Date   APPENDECTOMY     CARDIAC CATHETERIZATION     CHOLECYSTECTOMY     COLONOSCOPY WITH ESOPHAGOGASTRODUODENOSCOPY (EGD)     COLONOSCOPY WITH PROPOFOL N/A 03/16/2016   Procedure: COLONOSCOPY WITH PROPOFOL;  Surgeon: Lollie Sails, MD;  Location: [facility] ENDOSCOPY;  Service: Endoscopy;  Laterality: N/A;   ESOPHAGOGASTRODUODENOSCOPY (EGD) WITH PROPOFOL N/A 02/28/2020   Procedure: ESOPHAGOGASTRODUODENOSCOPY (EGD) WITH PROPOFOL;  Surgeon: Toledo, Benay Pike, MD;  Location: [facility] ENDOSCOPY;  Service: Gastroenterology;  Laterality: N/A;   EYE SURGERY     HERNIA REPAIR     LOWER EXTREMITY ANGIOGRAPHY Left 10/09/2019   Procedure: LOWER EXTREMITY ANGIOGRAPHY;  Surgeon: Algernon Huxley, MD;  Location: [place] [facility];  Service: Cardiovascular;  Laterality: Left;   LOWER EXTREMITY ANGIOGRAPHY Left 02/10/2021   Procedure: LOWER EXTREMITY ANGIOGRAPHY;  Surgeon: Algernon Huxley, MD;  Location: [place] [facility];  Service: Cardiovascular;  Laterality: Left;   NISSEN FUNDOPLICATION     testicular vein surgery     TONSILLECTOMY     TOTAL KNEE ARTHROPLASTY       Social History   Tobacco Use   Smoking status: Former    Packs/day: 2.00    Years: 20.00    Total pack years: 40.00    Types: Cigarettes    Quit date: 1979    Years since quitting: 45.1   Smokeless tobacco: Never   Vaping Use   Vaping Use: Never used  Substance Use Topics   Alcohol use: Yes    Alcohol/week: 1.0 standard drink of alcohol    Types: 1 Cans of beer per week    Comment: occassional    Drug use: No       Family History  Problem Relation Age of Onset   Varicose Veins Neg Hx    Vision loss Neg Hx    Stroke Neg Hx     Allergies  Allergen Reactions   Nsaids    Tolmetin     REVIEW OF SYSTEMS (Negative unless checked)   Constitutional: []$ Weight loss  []$ Fever  []$ Chills Cardiac: []$ Chest pain   []$ Chest pressure   []$ Palpitations   []$ Shortness of breath when laying flat   []$ Shortness of breath at rest   []$ Shortness of breath with exertion. Vascular:  []$ Pain in legs with walking   []$ Pain in legs at rest   []$ Pain in legs when laying flat   []$ Claudication   []$ Pain in feet when walking  []$ Pain in feet at rest  []$ Pain in feet when laying flat   []$ History of DVT   []$ Phlebitis   []$ Swelling in legs   []$ Varicose veins   []$ Non-healing ulcers Pulmonary:   []$ Uses home oxygen   []$ Productive cough   []$ Hemoptysis   []$ Wheeze  []$ COPD   []$ Asthma Neurologic:  []$ Dizziness  []$ Blackouts   []$ Seizures   []$ History of stroke   []$ History of TIA  []$ Aphasia   []$ Temporary blindness   []$ Dysphagia   []$ Weakness or numbness in arms   []$ Weakness or numbness in legs Musculoskeletal:  [x]$ Arthritis   []$ Joint swelling   [x]$ Joint pain   [x]$ Low back pain Hematologic:  []$ Easy bruising  []$ Easy bleeding   []$ Hypercoagulable state   []$ Anemic   Gastrointestinal:  []$ Blood in stool   []$ Vomiting blood  [x]$ Gastroesophageal reflux/heartburn   []$ Abdominal pain Genitourinary:  []$ Chronic kidney disease   []$ Difficult urination  []$ Frequent urination  []$ Burning with urination   []$ Hematuria Skin:  []$ Rashes   []$ Ulcers   []$ Wounds Psychological:  []$ History of anxiety   []$  History of major depression.  Physical Examination  BP (!) 162/70   Pulse 61   Ht 5' 10"$  (1.778 m)   Wt 172 lb (78 kg)   BMI 24.68 kg/m  Gen:  WD/WN, NAD.  Appears  younger than stated age. Head: [place]/AT, No temporalis wasting. Ear/Nose/Throat: Hearing grossly intact, nares w/o erythema or drainage Eyes: Conjunctiva clear. Sclera non-icteric Neck: Supple.  Trachea midline Pulmonary:  Good air movement, no use of accessory muscles.  Cardiac: RRR, no  JVD Vascular:  Vessel Right Left  Radial Palpable Palpable                          PT Palpable Palpable  DP Palpable Palpable   Gastrointestinal: soft, non-tender/non-distended. No guarding/reflex.  Musculoskeletal: M/S 5/5 throughout.  No deformity or atrophy.  No edema. Neurologic: Sensation grossly intact in extremities.  Symmetrical.  Speech is fluent.  Psychiatric: Judgment intact, Mood & affect appropriate for pt's clinical situation. Dermatologic: No rashes or ulcers noted.  No cellulitis or open wounds.      Labs No results found for this or any previous visit (from the past 2160 hour(s)).  Radiology No results found.  Assessment/Plan HTN (hypertension), benign blood pressure control important in reducing the progression of atherosclerotic disease. On appropriate oral medications.     Diabetes mellitus with stage 3 chronic kidney disease (HCC) blood glucose control important in reducing the progression of atherosclerotic disease. Also, involved in wound healing. On appropriate medications.     Renal failure, chronic, stage 3 (moderate) Limit contrast with procedures requiring contrast and hydrate well if contrast is used.  No need for invasive procedures at this time.  PVD (peripheral vascular disease) ([place]) His ABIs today remain noncompressible, but his digit pressures are in the normal range on each side and he has multiphasic waveforms bilaterally.  Currently, ulcers have all healed and he has no limb threatening issues.  We can now follow this annually.    Leotis Pain, MD  12/22/2022 9:41 AM    This note was created with Dragon medical transcription system.  Any  errors from dictation are purely unintentional

## 2022-12-22 NOTE — Assessment & Plan Note (Signed)
His ABIs today remain noncompressible, but his digit pressures are in the normal range on each side and he has multiphasic waveforms bilaterally.  Currently, ulcers have all healed and he has no limb threatening issues.  We can now follow this annually.

## 2022-12-30 LAB — VAS US ABI WITH/WO TBI
Left ABI: 1
Right ABI: >1

## 2023-05-18 ENCOUNTER — Ambulatory Visit: Payer: PRIVATE HEALTH INSURANCE | Admitting: Primary Care

## 2023-05-20 ENCOUNTER — Other Ambulatory Visit
Admission: RE | Admit: 2023-05-20 | Discharge: 2023-05-20 | Disposition: A | Discharge status: Home / Self Care | Payer: Medicare Other | Source: Ambulatory Visit | Attending: Student in an Organized Health Care Education/Training Program | Admitting: Student in an Organized Health Care Education/Training Program

## 2023-05-20 ENCOUNTER — Encounter: Payer: Self-pay | Admitting: Student in an Organized Health Care Education/Training Program

## 2023-05-20 ENCOUNTER — Ambulatory Visit (INDEPENDENT_AMBULATORY_CARE_PROVIDER_SITE_OTHER): Payer: Medicare Other | Admitting: Student in an Organized Health Care Education/Training Program

## 2023-05-20 VITALS — BP 110/60 | HR 69 | Temp 96.9°F | Ht 70.0 in | Wt 170.0 lb

## 2023-05-20 DIAGNOSIS — R531 Weakness: Secondary | ICD-10-CM | POA: Diagnosis not present

## 2023-05-20 DIAGNOSIS — I480 Paroxysmal atrial fibrillation: Secondary | ICD-10-CM | POA: Diagnosis not present

## 2023-05-20 DIAGNOSIS — R053 Chronic cough: Secondary | ICD-10-CM

## 2023-05-20 LAB — CBC WITH DIFFERENTIAL/PLATELET
Abs Immature Granulocytes: 0.05 10*3/uL (ref 0.00–0.07)
Basophils Absolute: 0 10*3/uL (ref 0.0–0.1)
Basophils Relative: 0 %
Eosinophils Absolute: 0 10*3/uL (ref 0.0–0.5)
Eosinophils Relative: 0 %
HCT: 38.7 % — ABNORMAL LOW (ref 39.0–52.0)
Hemoglobin: 13.5 g/dL (ref 13.0–17.0)
Immature Granulocytes: 1 %
Lymphocytes Relative: 27 %
Lymphs Abs: 2.9 10*3/uL (ref 0.7–4.0)
MCH: 34.4 pg — ABNORMAL HIGH (ref 26.0–34.0)
MCHC: 34.9 g/dL (ref 30.0–36.0)
MCV: 98.5 fL (ref 80.0–100.0)
Monocytes Absolute: 0.6 10*3/uL (ref 0.1–1.0)
Monocytes Relative: 5 %
Neutro Abs: 7.2 10*3/uL (ref 1.7–7.7)
Neutrophils Relative %: 67 %
Platelets: 233 10*3/uL (ref 150–400)
RBC: 3.93 MIL/uL — ABNORMAL LOW (ref 4.22–5.81)
RDW: 13.6 % (ref 11.5–15.5)
WBC: 10.8 10*3/uL — ABNORMAL HIGH (ref 4.0–10.5)
nRBC: 0 % (ref 0.0–0.2)

## 2023-05-20 LAB — COMPREHENSIVE METABOLIC PANEL
ALT: 36 U/L (ref 0–44)
AST: 22 U/L (ref 15–41)
Albumin: 4.1 g/dL (ref 3.5–5.0)
Alkaline Phosphatase: 41 U/L (ref 38–126)
Anion gap: 11 (ref 5–15)
BUN: 54 mg/dL — ABNORMAL HIGH (ref 8–23)
CO2: 18 mmol/L — ABNORMAL LOW (ref 22–32)
Calcium: 10.8 mg/dL — ABNORMAL HIGH (ref 8.9–10.3)
Chloride: 111 mmol/L (ref 98–111)
Creatinine, Ser: 2.12 mg/dL — ABNORMAL HIGH (ref 0.61–1.24)
GFR, Estimated: 30 mL/min — ABNORMAL LOW (ref >60)
Glucose, Bld: 118 mg/dL — ABNORMAL HIGH (ref 70–99)
Potassium: 4.8 mmol/L (ref 3.5–5.1)
Sodium: 140 mmol/L (ref 135–145)
Total Bilirubin: 1 mg/dL (ref 0.3–1.2)
Total Protein: 7.1 g/dL (ref 6.5–8.1)

## 2023-05-20 NOTE — Progress Notes (Signed)
Synopsis: Acute visit regarding cough  Assessment & Plan:   1. Chronic cough  He is presenting for the evaluation of a cough that appears to be chronic per wife's report and worsened after a recent URTI (though symptoms are vague). He could have a chronic cough (possible upper airway cough syndrome or reflux associated cough) that got exacerbated by a viral syndrome. There could be a component of obstructive lung disease given his history of smoking for which I will obtain a PFT as well as a CT scan of the chest.  As to the report of dizziness, I will obtain a complete metabolic profile and a CBC to rule out metabolic abnormalities contributing to his symptoms.    - Pulmonary Function Test [facility] Only; Future - CT CHEST WO CONTRAST; Future - CBC w/Diff; Future - Comp Met (CMET); Future  05/21/2023 update: CMP returned with AKI (rise in creatinine, BUN, and drop in CO2) which could be secondary to dehydration. Given symptoms of dizziness, I have asked the patient to present to the ED today to be evaluated.   Return in about 4 weeks (around 06/17/2023).  I spent 40 minutes caring for this patient today, including preparing to see the patient, obtaining a medical history , reviewing a separately obtained history, performing a medically appropriate examination and/or evaluation, counseling and educating the patient/family/caregiver, ordering medications, tests, or procedures, documenting clinical information in the electronic health record, and independently interpreting results (not separately reported/billed) and communicating results to the patient/family/caregiver  Raechel Chute, MD [facility]   End of visit medications:  No orders of the defined types were placed in this encounter.    Current Outpatient Medications:    acetaminophen (TYLENOL) 500 MG tablet, Take 1,000 mg by mouth every 6 (six) hours as needed., Disp: , Rfl:    albuterol (VENTOLIN HFA) 108 (90  Base) MCG/ACT inhaler, Inhale into the lungs. (Patient not taking: Reported on 05/20/2023), Disp: , Rfl:    aspirin 81 MG tablet, Take 81 mg by mouth daily., Disp: , Rfl:    atorvastatin (LIPITOR) 40 MG tablet, Take 40 mg by mouth daily., Disp: , Rfl:    b complex vitamins capsule, Take 1 capsule by mouth daily., Disp: , Rfl:    Blood Glucose Monitoring Suppl (ACCU-CHEK GUIDE ME) w/Device KIT, See admin instructions., Disp: , Rfl:    Blood Glucose Monitoring Suppl (GLUCOCOM BLOOD GLUCOSE MONITOR) DEVI, See admin instructions., Disp: , Rfl:    cetirizine (ZYRTEC) 10 MG chewable tablet, Chew 10 mg by mouth daily., Disp: , Rfl:    Cetirizine HCl 10 MG CAPS, Take by mouth., Disp: , Rfl:    clopidogrel (PLAVIX) 75 MG tablet, Take 1 tablet by mouth daily., Disp: , Rfl:    colchicine 0.6 MG tablet, Take 0.6 mg by mouth 2 (two) times daily as needed., Disp: , Rfl:    glimepiride (AMARYL) 2 MG tablet, TAKE 1 TABLET BY MOUTH ONCE DAILY WITH BREAKFAST, Disp: , Rfl:    ketorolac (ACULAR) 0.4 % SOLN, 1 drop 4 (four) times daily., Disp: , Rfl:    losartan (COZAAR) 100 MG tablet, Take 100 mg by mouth daily., Disp: , Rfl:    losartan (COZAAR) 50 MG tablet, Take 1 tablet by mouth daily., Disp: , Rfl:    metFORMIN (GLUCOPHAGE-XR) 500 MG 24 hr tablet, Take 500 mg by mouth daily with breakfast., Disp: , Rfl:    metoprolol succinate (TOPROL-XL) 100 MG 24 hr tablet, Take 100 mg by mouth  daily. Take with or immediately following a meal., Disp: , Rfl:    omeprazole (PRILOSEC) 20 MG capsule, Take 20 mg by mouth daily., Disp: , Rfl:    vitamin B-12 (CYANOCOBALAMIN) 1000 MCG tablet, Take 1,000 mcg by mouth daily., Disp: , Rfl:    Subjective:   PATIENT ID: Bobby Guest [name] GENDER: male DOB: April 13, 1941, MRN: [id number]  Chief Complaint  Patient presents with   Acute Home Visit    Dry cough, SOB and sore chest for 1 month. No f/c/s. Has taken 2 rounds of antibiotics which have not helped.    HPI  Bobby Warner is a  pleasant 82 year old male presenting to clinic for acute visit regarding cough.  He is a patient of Dr. Coralyn Helling and was last seen by him in January 2024.  He is accompanied by his wife.  Patient reports a cough that worsened around a month ago after what he describes as a cold. He denied shortness of breath, joint aches, sore throat, or productive cough around the time of this cold but did feel that a dry cough became incessant.  It is unclear from the history whether he had a chronic cough prior to that or not.  Patient does not think that he had a chronic cough but his wife reports that he does in fact have a chronic nonproductive cough that got worse after his cold.  Patient's history is somewhat vague and he isn't able to elaborate further.  Patient's wife reports having had a productive cough a couple months ago and the timeline of the symptoms do not align.  Furthermore, patient and wife reporting increased weakness as well as dizziness that is new.  The cough does not wake him up from sleep and is actually worse during the day rather than at night.  There is no sputum production, no hemoptysis, no night sweats, no chills, and no report of weight loss. He denies any wheeze, chest tightness, or chest pain. There have been no rashes.  Patient was recently seen at urgent care where a CXR was obtained and noted to be within normal. He also received a course of prednisone, doxycycline, and cough syrup without much improvement in his symptoms.  He had followed with Dr. Craige Cotta regarding obstructive sleep apnea for which she was prescribed a CPAP machine with which she is compliant.  There is no report of respiratory symptoms during his visits with Dr. Craige Cotta.  Ancillary information including prior medications, full medical/surgical/family/social histories, and PFTs (when available) are listed below and have been reviewed.   Review of Systems  Constitutional:  Positive for malaise/fatigue. Negative for  chills, diaphoresis, fever and weight loss.  Respiratory:  Positive for cough. Negative for hemoptysis, sputum production, shortness of breath and wheezing.   Cardiovascular:  Negative for chest pain, palpitations, orthopnea and leg swelling.  Skin:  Negative for rash.  Neurological:  Positive for dizziness. Negative for speech change, focal weakness and headaches.     Objective:   Vitals:   05/20/23 1554  BP: 110/60  Pulse: 69  Temp: (!) 96.9 F (36.1 C)  SpO2: 96%  Weight: 170 lb (77.1 kg)  Height: 5\' 10"  (1.778 m)   96% on RA BMI Readings from Last 3 Encounters:  05/20/23 24.39 kg/m  12/22/22 24.68 kg/m  11/13/22 24.71 kg/m   Wt Readings from Last 3 Encounters:  05/20/23 170 lb (77.1 kg)  12/22/22 172 lb (78 kg)  11/13/22 172 lb 3.2 oz (78.1 kg)  Physical Exam Constitutional:      Appearance: Normal appearance. He is not ill-appearing.  HENT:     Mouth/Throat:     Mouth: Mucous membranes are moist.  Cardiovascular:     Rate and Rhythm: Normal rate and regular rhythm.     Pulses: Normal pulses.     Heart sounds: Normal heart sounds.  Pulmonary:     Effort: Pulmonary effort is normal.     Breath sounds: Normal breath sounds.  Abdominal:     General: Abdomen is flat.     Palpations: Abdomen is soft.  Neurological:     General: No focal deficit present.     Mental Status: He is alert and oriented to person, place, and time. Mental status is at baseline.       Ancillary Information    Past Medical History:  Diagnosis Date   Arthritis    Back pain    Coronary artery disease    Diabetes mellitus without complication (HCC)    GERD (gastroesophageal reflux disease)    History of hiatal hernia    Hyperlipidemia    Hypertension    Sleep apnea      Family History  Problem Relation Age of Onset   Varicose Veins Neg Hx    Vision loss Neg Hx    Stroke Neg Hx      Past Surgical History:  Procedure Laterality Date   APPENDECTOMY     CARDIAC  CATHETERIZATION     CHOLECYSTECTOMY     COLONOSCOPY WITH ESOPHAGOGASTRODUODENOSCOPY (EGD)     COLONOSCOPY WITH PROPOFOL N/A 03/16/2016   Procedure: COLONOSCOPY WITH PROPOFOL;  Surgeon: [name], MD;  Location: [facility] ENDOSCOPY;  Service: Endoscopy;  Laterality: N/A;   ESOPHAGOGASTRODUODENOSCOPY (EGD) WITH PROPOFOL N/A 02/28/2020   Procedure: ESOPHAGOGASTRODUODENOSCOPY (EGD) WITH PROPOFOL;  Surgeon: Toledo, Boykin Nearing, MD;  Location: [facility] ENDOSCOPY;  Service: Gastroenterology;  Laterality: N/A;   EYE SURGERY     HERNIA REPAIR     LOWER EXTREMITY ANGIOGRAPHY Left 10/09/2019   Procedure: LOWER EXTREMITY ANGIOGRAPHY;  Surgeon: Annice Needy, MD;  Location: [facility] INVASIVE CV LAB;  Service: Cardiovascular;  Laterality: Left;   LOWER EXTREMITY ANGIOGRAPHY Left 02/10/2021   Procedure: LOWER EXTREMITY ANGIOGRAPHY;  Surgeon: Annice Needy, MD;  Location: [facility] INVASIVE CV LAB;  Service: Cardiovascular;  Laterality: Left;   NISSEN FUNDOPLICATION     testicular vein surgery     TONSILLECTOMY     TOTAL KNEE ARTHROPLASTY      Social History   Socioeconomic History   Marital status: Married    Spouse name: Diago Haik   Number of children: 1   Years of education: Not on file   Highest education level: Not on file  Occupational History   Not on file  Tobacco Use   Smoking status: Former    Current packs/day: 0.00    Average packs/day: 2.0 packs/day for 20.0 years (40.0 ttl pk-yrs)    Types: Cigarettes    Start date: 12    Quit date: 1979    Years since quitting: 45.5   Smokeless tobacco: Never  Vaping Use   Vaping status: Never Used  Substance and Sexual Activity   Alcohol use: Yes    Alcohol/week: 1.0 standard drink of alcohol    Types: 1 Cans of beer per week    Comment: occassional    Drug use: No   Sexual activity: Not on file  Other Topics Concern   Not on file  Social History  Narrative   Not on file   Social Determinants of Health   Financial Resource Strain: Not on file   Food Insecurity: Not on file  Transportation Needs: Not on file  Physical Activity: Not on file  Stress: Not on file  Social Connections: Not on file  Intimate Partner Violence: Not on file     Allergies  Allergen Reactions   Nsaids    Tolmetin      CBC    Component Value Date/Time   WBC 4.2 04/18/2013 0907   RBC 3.85 (L) 04/18/2013 0907   HGB 13.4 04/18/2013 0907   HCT 38.5 (L) 04/18/2013 0907   PLT 162 04/18/2013 0907   MCV 100 04/18/2013 0907   MCH 34.7 (H) 04/18/2013 0907   MCHC 34.7 04/18/2013 0907   RDW 14.2 04/18/2013 0907   LYMPHSABS 0.5 (L) 06/20/2012 2346   MONOABS 0.5 06/20/2012 2346   EOSABS 0.0 06/20/2012 2346   BASOSABS 0.1 06/20/2012 2346    Pulmonary Functions Testing Results:     No data to display          Outpatient Medications Prior to Visit  Medication Sig Dispense Refill   acetaminophen (TYLENOL) 500 MG tablet Take 1,000 mg by mouth every 6 (six) hours as needed.     albuterol (VENTOLIN HFA) 108 (90 Base) MCG/ACT inhaler Inhale into the lungs. (Patient not taking: Reported on 05/20/2023)     aspirin 81 MG tablet Take 81 mg by mouth daily.     atorvastatin (LIPITOR) 40 MG tablet Take 40 mg by mouth daily.     b complex vitamins capsule Take 1 capsule by mouth daily.     Blood Glucose Monitoring Suppl (ACCU-CHEK GUIDE ME) w/Device KIT See admin instructions.     Blood Glucose Monitoring Suppl (GLUCOCOM BLOOD GLUCOSE MONITOR) DEVI See admin instructions.     cetirizine (ZYRTEC) 10 MG chewable tablet Chew 10 mg by mouth daily.     Cetirizine HCl 10 MG CAPS Take by mouth.     clopidogrel (PLAVIX) 75 MG tablet Take 1 tablet by mouth daily.     colchicine 0.6 MG tablet Take 0.6 mg by mouth 2 (two) times daily as needed.     glimepiride (AMARYL) 2 MG tablet TAKE 1 TABLET BY MOUTH ONCE DAILY WITH BREAKFAST     ketorolac (ACULAR) 0.4 % SOLN 1 drop 4 (four) times daily.     losartan (COZAAR) 100 MG tablet Take 100 mg by mouth daily.     losartan  (COZAAR) 50 MG tablet Take 1 tablet by mouth daily.     metFORMIN (GLUCOPHAGE-XR) 500 MG 24 hr tablet Take 500 mg by mouth daily with breakfast.     metoprolol succinate (TOPROL-XL) 100 MG 24 hr tablet Take 100 mg by mouth daily. Take with or immediately following a meal.     omeprazole (PRILOSEC) 20 MG capsule Take 20 mg by mouth daily.     vitamin B-12 (CYANOCOBALAMIN) 1000 MCG tablet Take 1,000 mcg by mouth daily.     No facility-administered medications prior to visit.

## 2023-05-21 ENCOUNTER — Emergency Department: Payer: Medicare Other

## 2023-05-21 ENCOUNTER — Inpatient Hospital Stay
Admission: EM | Admit: 2023-05-21 | Discharge: 2023-05-23 | DRG: 309 | Disposition: A | Discharge status: Home / Self Care | Payer: Medicare Other | Attending: Internal Medicine | Admitting: Internal Medicine

## 2023-05-21 ENCOUNTER — Inpatient Hospital Stay: Payer: Medicare Other

## 2023-05-21 ENCOUNTER — Encounter: Payer: Self-pay | Admitting: Intensive Care

## 2023-05-21 ENCOUNTER — Other Ambulatory Visit: Payer: Self-pay

## 2023-05-21 DIAGNOSIS — R634 Abnormal weight loss: Secondary | ICD-10-CM | POA: Diagnosis present

## 2023-05-21 DIAGNOSIS — Z79899 Other long term (current) drug therapy: Secondary | ICD-10-CM

## 2023-05-21 DIAGNOSIS — E860 Dehydration: Secondary | ICD-10-CM | POA: Diagnosis present

## 2023-05-21 DIAGNOSIS — F0390 Unspecified dementia without behavioral disturbance: Secondary | ICD-10-CM | POA: Diagnosis present

## 2023-05-21 DIAGNOSIS — Z888 Allergy status to other drugs, medicaments and biological substances status: Secondary | ICD-10-CM

## 2023-05-21 DIAGNOSIS — I712 Thoracic aortic aneurysm, without rupture, unspecified: Secondary | ICD-10-CM | POA: Diagnosis present

## 2023-05-21 DIAGNOSIS — Z886 Allergy status to analgesic agent status: Secondary | ICD-10-CM

## 2023-05-21 DIAGNOSIS — G4733 Obstructive sleep apnea (adult) (pediatric): Secondary | ICD-10-CM | POA: Diagnosis present

## 2023-05-21 DIAGNOSIS — I4891 Unspecified atrial fibrillation: Secondary | ICD-10-CM | POA: Insufficient documentation

## 2023-05-21 DIAGNOSIS — N401 Enlarged prostate with lower urinary tract symptoms: Secondary | ICD-10-CM | POA: Diagnosis present

## 2023-05-21 DIAGNOSIS — E785 Hyperlipidemia, unspecified: Secondary | ICD-10-CM | POA: Diagnosis present

## 2023-05-21 DIAGNOSIS — Z7902 Long term (current) use of antithrombotics/antiplatelets: Secondary | ICD-10-CM

## 2023-05-21 DIAGNOSIS — Z9049 Acquired absence of other specified parts of digestive tract: Secondary | ICD-10-CM

## 2023-05-21 DIAGNOSIS — I2510 Atherosclerotic heart disease of native coronary artery without angina pectoris: Secondary | ICD-10-CM | POA: Diagnosis present

## 2023-05-21 DIAGNOSIS — Z1152 Encounter for screening for COVID-19: Secondary | ICD-10-CM | POA: Diagnosis not present

## 2023-05-21 DIAGNOSIS — I739 Peripheral vascular disease, unspecified: Secondary | ICD-10-CM | POA: Diagnosis not present

## 2023-05-21 DIAGNOSIS — Z87891 Personal history of nicotine dependence: Secondary | ICD-10-CM

## 2023-05-21 DIAGNOSIS — I480 Paroxysmal atrial fibrillation: Secondary | ICD-10-CM | POA: Diagnosis present

## 2023-05-21 DIAGNOSIS — E1122 Type 2 diabetes mellitus with diabetic chronic kidney disease: Secondary | ICD-10-CM | POA: Diagnosis present

## 2023-05-21 DIAGNOSIS — R531 Weakness: Secondary | ICD-10-CM | POA: Diagnosis not present

## 2023-05-21 DIAGNOSIS — R0609 Other forms of dyspnea: Secondary | ICD-10-CM | POA: Diagnosis present

## 2023-05-21 DIAGNOSIS — N138 Other obstructive and reflux uropathy: Secondary | ICD-10-CM | POA: Diagnosis present

## 2023-05-21 DIAGNOSIS — K30 Functional dyspepsia: Secondary | ICD-10-CM | POA: Diagnosis present

## 2023-05-21 DIAGNOSIS — I700 Atherosclerosis of aorta: Secondary | ICD-10-CM | POA: Diagnosis present

## 2023-05-21 DIAGNOSIS — K219 Gastro-esophageal reflux disease without esophagitis: Secondary | ICD-10-CM | POA: Diagnosis present

## 2023-05-21 DIAGNOSIS — Z7901 Long term (current) use of anticoagulants: Secondary | ICD-10-CM

## 2023-05-21 DIAGNOSIS — N183 Chronic kidney disease, stage 3 unspecified: Secondary | ICD-10-CM | POA: Diagnosis not present

## 2023-05-21 DIAGNOSIS — R051 Acute cough: Secondary | ICD-10-CM

## 2023-05-21 DIAGNOSIS — E1151 Type 2 diabetes mellitus with diabetic peripheral angiopathy without gangrene: Secondary | ICD-10-CM | POA: Diagnosis present

## 2023-05-21 DIAGNOSIS — M1990 Unspecified osteoarthritis, unspecified site: Secondary | ICD-10-CM | POA: Diagnosis present

## 2023-05-21 DIAGNOSIS — N1831 Chronic kidney disease, stage 3a: Secondary | ICD-10-CM | POA: Diagnosis present

## 2023-05-21 DIAGNOSIS — Z6824 Body mass index (BMI) 24.0-24.9, adult: Secondary | ICD-10-CM

## 2023-05-21 DIAGNOSIS — Z7984 Long term (current) use of oral hypoglycemic drugs: Secondary | ICD-10-CM

## 2023-05-21 DIAGNOSIS — N179 Acute kidney failure, unspecified: Secondary | ICD-10-CM | POA: Diagnosis present

## 2023-05-21 DIAGNOSIS — R0789 Other chest pain: Secondary | ICD-10-CM | POA: Diagnosis present

## 2023-05-21 DIAGNOSIS — Z7982 Long term (current) use of aspirin: Secondary | ICD-10-CM

## 2023-05-21 DIAGNOSIS — I129 Hypertensive chronic kidney disease with stage 1 through stage 4 chronic kidney disease, or unspecified chronic kidney disease: Secondary | ICD-10-CM | POA: Diagnosis present

## 2023-05-21 DIAGNOSIS — R413 Other amnesia: Secondary | ICD-10-CM | POA: Insufficient documentation

## 2023-05-21 DIAGNOSIS — I10 Essential (primary) hypertension: Secondary | ICD-10-CM | POA: Diagnosis present

## 2023-05-21 LAB — BASIC METABOLIC PANEL
Anion gap: 10 (ref 5–15)
BUN: 50 mg/dL — ABNORMAL HIGH (ref 8–23)
CO2: 19 mmol/L — ABNORMAL LOW (ref 22–32)
Calcium: 10.5 mg/dL — ABNORMAL HIGH (ref 8.9–10.3)
Chloride: 110 mmol/L (ref 98–111)
Creatinine, Ser: 2.04 mg/dL — ABNORMAL HIGH (ref 0.61–1.24)
GFR, Estimated: 32 mL/min — ABNORMAL LOW (ref >60)
Glucose, Bld: 185 mg/dL — ABNORMAL HIGH (ref 70–99)
Potassium: 5 mmol/L (ref 3.5–5.1)
Sodium: 139 mmol/L (ref 135–145)

## 2023-05-21 LAB — TROPONIN I (HIGH SENSITIVITY)
Troponin I (High Sensitivity): 27 ng/L — ABNORMAL HIGH (ref <18)
Troponin I (High Sensitivity): 23 ng/L — ABNORMAL HIGH (ref <18)

## 2023-05-21 LAB — CBC
HCT: 39.5 % (ref 39.0–52.0)
Hemoglobin: 13.4 g/dL (ref 13.0–17.0)
MCH: 34.4 pg — ABNORMAL HIGH (ref 26.0–34.0)
MCHC: 33.9 g/dL (ref 30.0–36.0)
MCV: 101.3 fL — ABNORMAL HIGH (ref 80.0–100.0)
Platelets: 199 10*3/uL (ref 150–400)
RBC: 3.9 MIL/uL — ABNORMAL LOW (ref 4.22–5.81)
RDW: 13.9 % (ref 11.5–15.5)
WBC: 9.4 10*3/uL (ref 4.0–10.5)
nRBC: 0 % (ref 0.0–0.2)

## 2023-05-21 LAB — PROTIME-INR
INR: 1 (ref 0.8–1.2)
Prothrombin Time: 13.2 seconds (ref 11.4–15.2)

## 2023-05-21 LAB — SARS CORONAVIRUS 2 BY RT PCR: SARS Coronavirus 2 by RT PCR: NEGATIVE

## 2023-05-21 LAB — PROCALCITONIN: Procalcitonin: <0.1 ng/mL

## 2023-05-21 LAB — CBG MONITORING, ED: Glucose-Capillary: 109 mg/dL — ABNORMAL HIGH (ref 70–99)

## 2023-05-21 LAB — BRAIN NATRIURETIC PEPTIDE: B Natriuretic Peptide: 94 pg/mL (ref 0.0–100.0)

## 2023-05-21 MED ORDER — ATORVASTATIN CALCIUM 20 MG PO TABS: 40.0000 mg | ORAL_TABLET | Freq: Every evening | ORAL | Status: DC

## 2023-05-21 MED ORDER — HEPARIN SODIUM (PORCINE) 5000 UNIT/ML IJ SOLN
5000.0000 [IU] | Freq: Three times a day (TID) | INTRAMUSCULAR | Status: DC
  Administered 2023-05-21 – 2023-05-23 (×3): 5000 [IU] via SUBCUTANEOUS
  Filled 2023-05-21 (×4): qty 1

## 2023-05-21 MED ORDER — SENNOSIDES-DOCUSATE SODIUM 8.6-50 MG PO TABS: 1.0000 | ORAL_TABLET | Freq: Every evening | ORAL | Status: DC | PRN

## 2023-05-21 MED ORDER — IPRATROPIUM-ALBUTEROL 0.5-2.5 (3) MG/3ML IN SOLN
3.0000 mL | Freq: Once | RESPIRATORY_TRACT | Status: AC
  Administered 2023-05-21: 3 mL via RESPIRATORY_TRACT
  Filled 2023-05-21: qty 3

## 2023-05-21 MED ORDER — CLOPIDOGREL BISULFATE 75 MG PO TABS
75.0000 mg | ORAL_TABLET | Freq: Every day | ORAL | Status: DC
  Administered 2023-05-22 – 2023-05-23 (×2): 75 mg via ORAL
  Filled 2023-05-21 (×2): qty 1

## 2023-05-21 MED ORDER — NITROGLYCERIN 2 % TD OINT: 1.0000 [in_us] | TOPICAL_OINTMENT | Freq: Four times a day (QID) | TRANSDERMAL | Status: AC | PRN

## 2023-05-21 MED ORDER — SODIUM CHLORIDE 0.9 % IV BOLUS
1000.0000 mL | Freq: Once | INTRAVENOUS | Status: AC
  Administered 2023-05-21: 1000 mL via INTRAVENOUS

## 2023-05-21 MED ORDER — ONDANSETRON HCL 4 MG/2ML IJ SOLN: 4.0000 mg | Freq: Four times a day (QID) | INTRAMUSCULAR | Status: DC | PRN

## 2023-05-21 MED ORDER — ONDANSETRON HCL 4 MG PO TABS: 4.0000 mg | ORAL_TABLET | Freq: Four times a day (QID) | ORAL | Status: DC | PRN

## 2023-05-21 MED ORDER — ATORVASTATIN CALCIUM 20 MG PO TABS
40.0000 mg | ORAL_TABLET | Freq: Every day | ORAL | Status: DC
  Administered 2023-05-22 – 2023-05-23 (×2): 40 mg via ORAL
  Filled 2023-05-21 (×2): qty 2

## 2023-05-21 MED ORDER — HYDRALAZINE HCL 20 MG/ML IJ SOLN: 5.0000 mg | Freq: Four times a day (QID) | INTRAMUSCULAR | Status: DC | PRN

## 2023-05-21 MED ORDER — PANTOPRAZOLE SODIUM 40 MG PO TBEC: 40.0000 mg | DELAYED_RELEASE_TABLET | Freq: Two times a day (BID) | ORAL | Status: DC | PRN

## 2023-05-21 MED ORDER — ASPIRIN 81 MG PO TBEC
81.0000 mg | DELAYED_RELEASE_TABLET | Freq: Every day | ORAL | Status: DC
  Administered 2023-05-22 – 2023-05-23 (×2): 81 mg via ORAL
  Filled 2023-05-21 (×2): qty 1

## 2023-05-21 MED ORDER — ACETAMINOPHEN 325 MG PO TABS
650.0000 mg | ORAL_TABLET | Freq: Four times a day (QID) | ORAL | Status: DC | PRN
  Administered 2023-05-21: 650 mg via ORAL
  Filled 2023-05-21: qty 2

## 2023-05-21 MED ORDER — BENZONATATE 100 MG PO CAPS: 100.0000 mg | ORAL_CAPSULE | Freq: Two times a day (BID) | ORAL | Status: DC | PRN

## 2023-05-21 MED ORDER — ACETAMINOPHEN 650 MG RE SUPP: 650.0000 mg | Freq: Four times a day (QID) | RECTAL | Status: DC | PRN

## 2023-05-21 NOTE — Assessment & Plan Note (Signed)
-   CPAP nightly ordered 

## 2023-05-21 NOTE — Assessment & Plan Note (Signed)
Atorvastatin 40 mg every evening resumed 

## 2023-05-21 NOTE — ED Triage Notes (Signed)
Patient sent by PCP after abnormal lab values from yesterdays blood work. Creatinine and BUN elevated.   Patient reports feeling weak and cold symptoms X1 month. Dry cough present

## 2023-05-21 NOTE — ED Provider Notes (Signed)
[facility] Provider Note    Event Date/Time   First MD Initiated Contact with Patient 05/21/23 1513     (approximate)   History   Abnormal Lab   HPI  Bobby Warner. is a 82 y.o. male  here with abnormal lab and cough. Pt reports that over the past month he has had persistent cough and weakness. He has had poor energy, lightheadedness with standing, and fatigue. He has had difficulty exerting himself. Went to PCP yesterday and had labs, told to come here for evaluation 2/2 dehydration/elevated Cr. Denies known h/o CKD. He has had 20 lb weight loss over past year which he thought was 2/2 his diabetic medications.       Physical Exam   Triage Vital Signs: ED Triage Vitals  Encounter Vitals Group     BP 05/21/23 1042 (!) 150/69     Systolic BP Percentile --      Diastolic BP Percentile --      Pulse Rate 05/21/23 1042 77     Resp 05/21/23 1042 20     Temp 05/21/23 1051 97.7 F (36.5 C)     Temp Source 05/21/23 1051 Oral     SpO2 05/21/23 1042 100 %     Weight 05/21/23 1047 174 lb (78.9 kg)     Height 05/21/23 1047 5\' 10"  (1.778 m)     Head Circumference --      Peak Flow --      Pain Score 05/21/23 1047 5     Pain Loc --      Pain Education --      Exclude from Growth Chart --     Most recent vital signs: Vitals:   05/21/23 1600 05/21/23 1630  BP: 137/78 (!) 93/52  Pulse: 69 91  Resp: 12 15  Temp:    SpO2: 94% 97%     General: Awake, no distress.  CV:  Good peripheral perfusion. Irregular rhythm. Resp:  Normal work of breathing. Rales noted in L base, RML. Abd:  No distention. No tenderness.  Other:  No edema. Mildly dry MM.   ED Results / Procedures / Treatments   Labs (all labs ordered are listed, but only abnormal results are displayed) Labs Reviewed  BASIC METABOLIC PANEL - Abnormal; Notable for the following components:      Result Value   CO2 19 (*)    Glucose, Bld 185 (*)    BUN 50 (*)    Creatinine, Ser 2.04  (*)    Calcium 10.5 (*)    GFR, Estimated 32 (*)    All other components within normal limits  CBC - Abnormal; Notable for the following components:   RBC 3.90 (*)    MCV 101.3 (*)    MCH 34.4 (*)    All other components within normal limits  TROPONIN I (HIGH SENSITIVITY) - Abnormal; Notable for the following components:   Troponin I (High Sensitivity) 27 (*)    All other components within normal limits  TROPONIN I (HIGH SENSITIVITY) - Abnormal; Notable for the following components:   Troponin I (High Sensitivity) 23 (*)    All other components within normal limits  SARS CORONAVIRUS 2 BY RT PCR  CULTURE, BLOOD (ROUTINE X 2)  CULTURE, BLOOD (ROUTINE X 2)  PROTIME-INR  BRAIN NATRIURETIC PEPTIDE  PROCALCITONIN     EKG Sinus rhythm with frequent pACs, PVCs. LVH. VR 85, QRS 84, QTc 395. No acute ST elevations.   RADIOLOGY  CXR: Nodular opacity R lung apex   I also independently reviewed and agree with radiologist interpretations.   PROCEDURES:  Critical Care performed: No  .1-3 Lead EKG Interpretation  Performed by: Shaune Pollack, MD Authorized by: Shaune Pollack, MD     Interpretation: abnormal     ECG rate:  80-100   ECG rate assessment: tachycardic     Rhythm: atrial fibrillation     Ectopy: PVCs     Conduction: normal   Comments:     Indication: Weakness     MEDICATIONS ORDERED IN ED: Medications  sodium chloride 0.9 % bolus 1,000 mL (1,000 mLs Intravenous New Bag/Given 05/21/23 1554)     IMPRESSION / MDM / ASSESSMENT AND PLAN / ED COURSE  I reviewed the triage vital signs and the nursing notes.                              Differential diagnosis includes, but is not limited to, PNA, underlying malignancy, AFib, CHF, dehydration/AKI, deconditioning  Patient's presentation is most consistent with acute presentation with potential threat to life or bodily function.  The patient is on the cardiac monitor to evaluate for evidence of arrhythmia and/or  significant heart rate changes  82 yo M with PMHx DM, HTN, HLD, GERD here with cough, fatigue. Labs show persistent AKI, mild trop elevation concerning for possible demand though may be related to his CKD. CXR shows subtle nodular opacities. Labs o/w reassuring. No significant leukocytosis. Will plan to hydrate, admit. Of note, EKG shows possible new onset AFib which would be new for him.  Admit to medicine.  FINAL CLINICAL IMPRESSION(S) / ED DIAGNOSES   Final diagnoses:  AKI (acute kidney injury) (HCC)  Weakness  Acute cough     Rx / DC Orders   ED Discharge Orders     None        Note:  This document was prepared using Dragon voice recognition software and may include unintentional dictation errors.   Shaune Pollack, MD 05/21/23 1705

## 2023-05-21 NOTE — Assessment & Plan Note (Signed)
Consistent with early development of dementia Recommend outpatient follow-up with pcp for monitoring

## 2023-05-21 NOTE — Assessment & Plan Note (Signed)
Status post left lower extremity revascularizations

## 2023-05-21 NOTE — Assessment & Plan Note (Signed)
Suspect prerenal, secondary to cardiorenal Strict I's and O's Status post sodium chloride, 2 L bolus per EDP Will order renal ultrasound Check BMP in the a.m.

## 2023-05-21 NOTE — Assessment & Plan Note (Addendum)
Etiology workup in progress given elevated high sensitive troponin in the ED, coronary atherosclerosis, with no acute kidney injury, new onset heart failure cannot be excluded at this time Procalcitonin ordered and is in process at the time of this dictation Strict I's and O's, complete echo ordered Nitroglycerin 1 inch ointment every 6 hours as needed for chest pain, 1 day ordered

## 2023-05-21 NOTE — Assessment & Plan Note (Signed)
Resumed home aspirin 81 mg daily and Plavix 75 mg daily

## 2023-05-21 NOTE — H&P (Addendum)
History and Physical   Bobby Warner ZOX:[id number] DOB: 1941-01-06 DOA: 05/21/2023  PCP: Lauro Regulus, MD  Specialist provider: Dr. Gillermo [facility] clinic cardiology Patient coming from: home  I have personally briefly reviewed patient's old medical records in [facility] EMR.  Chief Concern: Abnormal labs, dyspnea on exertion  HPI: Bobby Warner, [name] 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  Patient reports that he has been feeling weakness with exertion and fatigue.  Patient has had an unintentional 20 pound weight loss 1 year.  Vitals in the ED showed temperature 97.7, respiration rate of 20, heart rate of 77, blood pressure 150/69, SpO2 100% on room air.  Serum sodium is 139, potassium 5.0, chloride 110, bicarb 19, BUN of 50, serum creatinine of 2.04, EGFR 32, nonfasting blood glucose 185, WBC 9.4, platelets 199, hemoglobin 13.4.  BNP was 94.  High sensitive troponin was 27, and on repeat was 23.  EKG performed in the emergency department showed new atrial fibrillation, with rate of 85, QTc of 395.  ED treatment: DuoNebs one-time treatment, sodium chloride 1 L bolus x 2. ---------------------------- At bedside, patient was able to tell me his name, age, current month of July, and current location. He was not able to tell me the current calendar year.   He states that his outaptient pcp call family, and told patient to come the ED because patient may be dehydrated.  He has been having chest pressure, comes and goes for about 1 months. He feels like he has been having reflux and like indigestion.  He endorses shortness of breath with exertion and weakness and fatigue with exertion.  He feels  that at times, his legs will give out on him.  He denies swelling of his lower extremities.  He endorses nausea every day.  He denies vomiting.  He endorses a cough that has started about 1 month ago.  Social history: He lives at home with his spouse. He denies tobacco and recreational drug use. He infrequently uses ETOH, 'once in a blue moon'.  He is retired.  ROS: Constitutional: no weight change, no fever ENT/Mouth: no sore throat, no rhinorrhea Eyes: no eye pain, no vision changes Cardiovascular: no chest pain, no dyspnea,  no edema, no palpitations Respiratory: + cough, no sputum, no wheezing Gastrointestinal: + nausea, no vomiting, no diarrhea, no constipation Genitourinary: no urinary incontinence, no dysuria, no hematuria Musculoskeletal: no arthralgias, no myalgias Skin: no skin lesions, no pruritus, Neuro: + weakness, no loss of consciousness, no syncope Psych: no anxiety, no depression, + decrease appetite Heme/Lymph: no bruising, no bleeding  ED Course: Discussed with emergency medicine provider, patient requiring hospitalization for chief concerns of acute kidney injury and new onset atrial fibrillation.  Assessment/Plan  Principal Problem:   Dyspnea on exertion Active Problems:   Atherosclerosis of abdominal aorta (HCC)   Benign localized hyperplasia of prostate with urinary obstruction   Coronary atherosclerosis   Diabetes mellitus with stage 3 chronic kidney disease (HCC)   GERD (gastroesophageal reflux disease)   HTN (hypertension), benign   OSA (obstructive sleep apnea)   PVD (peripheral vascular disease) (HCC)   Hyperlipidemia   CKD stage 3a, GFR 45-59 ml/min (HCC)   AKI (acute kidney injury) (HCC)   Memory changes   Chest pressure  Atrial fibrillation, new onset (HCC)   Assessment and Plan:  * Dyspnea on exertion Etiology workup in progress given elevated high sensitive troponin in the ED, coronary atherosclerosis, with no acute kidney injury, new  onset heart failure cannot be excluded at this time Procalcitonin ordered and is in process at the time of this dictation Strict I's and O's, complete echo ordered Nitroglycerin 1 inch ointment every 6 hours as needed for chest pain, 1 day ordered  Atrial fibrillation, new onset (HCC) Complete echo has been ordered, workup in progress, suspect secondary to new onset heart failure Admit to telemetry cardiac, inpatient  Chest pressure Query atypical angina High sensitive troponin is downtrending and patient denies chest pressure and/or chest pain at bedside Nitroglycerin ointment 1 inch every 6 hours as needed for chest pain, 1 day ordered Complete echo ordered  Memory changes Consistent with early development of dementia Recommend outpatient follow-up with pcp for monitoring  AKI (acute kidney injury) (HCC) Suspect prerenal, secondary to cardiorenal Strict I's and O's Status post sodium chloride, 2 L bolus per EDP Will order renal ultrasound Check BMP in the a.m.  Hyperlipidemia Atorvastatin 40 mg every evening resumed  PVD (peripheral vascular disease) (HCC) Status post left lower extremity revascularizations  OSA (obstructive sleep apnea) CPAP nightly ordered  HTN (hypertension), benign Hydralazine 5 mg IV every 6 hours as needed for SBP greater 170, 5 days ordered  Coronary atherosclerosis Resumed home aspirin 81 mg daily and Plavix 75 mg daily  Chart reviewed.   DVT prophylaxis: Heparin 5000 units subcutaneous every 8 hours Code Status: full code Diet: Heart healthy Family Communication: updated spouse and son Jeremia Panton) and daughter in law were at beside Disposition Plan: Pending clinical course, complete echo result Consults called: None at this time, pending complete echo Admission status: Telemetry cardiac, inpatient  Past Medical History:  Diagnosis Date   Arthritis    Back pain    Coronary artery disease    Diabetes mellitus without complication  (HCC)    GERD (gastroesophageal reflux disease)    History of hiatal hernia    Hyperlipidemia    Hypertension    Sleep apnea    Past Surgical History:  Procedure Laterality Date   APPENDECTOMY     CARDIAC CATHETERIZATION     CHOLECYSTECTOMY     COLONOSCOPY WITH ESOPHAGOGASTRODUODENOSCOPY (EGD)     COLONOSCOPY WITH PROPOFOL N/A 03/16/2016   Procedure: COLONOSCOPY WITH PROPOFOL;  Surgeon: [name], MD;  Location: [facility] ENDOSCOPY;  Service: Endoscopy;  Laterality: N/A;   ESOPHAGOGASTRODUODENOSCOPY (EGD) WITH PROPOFOL N/A 02/28/2020   Procedure: ESOPHAGOGASTRODUODENOSCOPY (EGD) WITH PROPOFOL;  Surgeon: Toledo, Boykin Nearing, MD;  Location: [facility] ENDOSCOPY;  Service: Gastroenterology;  Laterality: N/A;   EYE SURGERY     HERNIA REPAIR     LOWER EXTREMITY ANGIOGRAPHY Left 10/09/2019   Procedure: LOWER EXTREMITY ANGIOGRAPHY;  Surgeon: Annice Needy, MD;  Location: [facility] INVASIVE CV LAB;  Service: Cardiovascular;  Laterality: Left;   LOWER EXTREMITY ANGIOGRAPHY Left 02/10/2021   Procedure: LOWER EXTREMITY ANGIOGRAPHY;  Surgeon: Annice Needy, MD;  Location: [facility] INVASIVE CV LAB;  Service: Cardiovascular;  Laterality: Left;   NISSEN FUNDOPLICATION     testicular vein surgery     TONSILLECTOMY     TOTAL KNEE ARTHROPLASTY     Social History:  reports that he quit smoking about 45 years ago. His smoking use included cigarettes. He started smoking about 65 years ago. He has a 40 pack-year smoking history. He has never used  smokeless tobacco. He reports current alcohol use of about 1.0 standard drink of alcohol per week. He reports that he does not use drugs.  Allergies  Allergen Reactions   Nsaids    Tolmetin    Family History  Problem Relation Age of Onset   Varicose Veins Neg Hx    Vision loss Neg Hx    Stroke Neg Hx    Family history: Family history reviewed and not pertinent.  Prior to Admission medications   Medication Sig Start Date End Date Taking? Authorizing Provider  acetaminophen  (TYLENOL) 500 MG tablet Take 1,000 mg by mouth every 6 (six) hours as needed.    [provider]  albuterol (VENTOLIN HFA) 108 (90 Base) MCG/ACT inhaler Inhale into the lungs. Patient not taking: Reported on 05/20/2023 11/18/20   [provider]  aspirin 81 MG tablet Take 81 mg by mouth daily.    [provider]  atorvastatin (LIPITOR) 40 MG tablet Take 40 mg by mouth daily. 07/03/19   [provider]  b complex vitamins capsule Take 1 capsule by mouth daily.    [provider]  cetirizine (ZYRTEC) 10 MG chewable tablet Chew 10 mg by mouth daily.    [provider]  Cetirizine HCl 10 MG CAPS Take by mouth.    [provider]  clopidogrel (PLAVIX) 75 MG tablet Take 1 tablet by mouth daily. 04/21/21   [provider]  colchicine 0.6 MG tablet Take 0.6 mg by mouth 2 (two) times daily as needed. 05/30/19   [provider]  glimepiride (AMARYL) 2 MG tablet TAKE 1 TABLET BY MOUTH ONCE DAILY WITH BREAKFAST 07/06/19   [provider]  ketorolac (ACULAR) 0.4 % SOLN 1 drop 4 (four) times daily.    [provider]  losartan (COZAAR) 100 MG tablet Take 100 mg by mouth daily. 06/18/19   [provider]  losartan (COZAAR) 50 MG tablet Take 1 tablet by mouth daily. 04/14/21   [provider]  metFORMIN (GLUCOPHAGE-XR) 500 MG 24 hr tablet Take 500 mg by mouth daily with breakfast.    [provider]  metoprolol succinate (TOPROL-XL) 100 MG 24 hr tablet Take 100 mg by mouth daily. Take with or immediately following a meal.    [provider]  omeprazole (PRILOSEC) 20 MG capsule Take 20 mg by mouth daily.    [provider]  vitamin B-12 (CYANOCOBALAMIN) 1000 MCG tablet Take 1,000 mcg by mouth daily.    [provider]   Physical Exam: Vitals:   05/21/23 1505 05/21/23 1530 05/21/23 1600 05/21/23 1630  BP: 121/78 (!) 130/99 137/78 (!) 93/52  Pulse: 84 82 69 91  Resp: 20  14 12 15   Temp:      TempSrc:      SpO2: 94% 98% 94% 97%  Weight:      Height:       Constitutional: appears age-appropriate, frail Eyes: PERRL, lids and conjunctivae normal ENMT: Mucous membranes are moist. Posterior pharynx clear of any exudate or lesions. Age-appropriate dentition. Hearing appropriate Neck: normal, supple, no masses, no thyromegaly Respiratory: clear to auscultation bilaterally, no wheezing, no crackles. Normal respiratory effort. No accessory muscle use.  Cardiovascular: Irregular rate and rhythm, no murmurs / rubs / gallops. No extremity edema. 2+ pedal pulses. No carotid bruits.  Abdomen: no tenderness, no masses palpated, no hepatosplenomegaly. Bowel sounds positive.  Musculoskeletal: no clubbing / cyanosis. No joint deformity upper and lower extremities. Good ROM, no contractures, no atrophy.  Normal muscle tone.  Skin: no rashes, lesions, ulcers. No induration Neurologic: Sensation intact. Strength 5/5 in all 4.  Psychiatric: Normal judgment and insight. Alert and oriented x 3. Normal mood.   EKG: independently reviewed, showing atrial fibrillation with rate of 85, QTc 395  Chest x-ray on Admission: I personally reviewed and I agree with radiologist reading as below.  CT Chest Wo Contrast  Result Date: 05/21/2023 CLINICAL DATA:  Respiratory illness abnormal lab values EXAM: CT CHEST WITHOUT CONTRAST TECHNIQUE: Multidetector CT imaging of the chest was performed following the standard protocol without IV contrast. RADIATION DOSE REDUCTION: This exam was performed according to the departmental dose-optimization program which includes automated exposure control, adjustment of the mA and/or kV according to patient size and/or use of iterative reconstruction technique. COMPARISON:  Chest x-ray 05/21/2023, CT 06/20/2012 FINDINGS: Cardiovascular: Limited evaluation without intravenous contrast. Mild aortic atherosclerosis. Aneurysmal dilatation of the distal arch/proximal  descending thoracic aorta measuring up to 4 cm on sagittal series 6, image 103. Coronary vascular calcification. Normal cardiac size. No pericardial effusion Mediastinum/Nodes: Midline trachea. No thyroid mass. No suspicious lymph nodes. Interval hiatal hernia repair. Lungs/Pleura: Emphysema. No acute airspace disease, pleural effusion or pneumothorax. No suspicious pulmonary nodules. Mild lower lobe bronchiectasis Upper Abdomen: No acute finding. Suspect large duodenal diverticulum, incompletely visualized Musculoskeletal: No acute osseous abnormality IMPRESSION: 1. No CT evidence for acute intrathoracic abnormality. 2. Emphysema.  No suspicious lung nodules. 3. Aneurysmal dilatation of the distal arch/proximal descending thoracic aorta up to 4 cm. Recommend semi-annual imaging followup by CTA or MRA and referral to cardiothoracic surgery if not already obtained. This recommendation follows 2010 ACCF/AHA/AATS/ACR/ASA/SCA/SCAI/SIR/STS/SVM Guidelines for the Diagnosis and Management of Patients With Thoracic Aortic Disease. Circulation. 2010; 121: G956-O13. Aortic aneurysm NOS (ICD10-I71.9) Aortic Atherosclerosis (ICD10-I70.0) and Emphysema (ICD10-J43.9). Electronically Signed   By: Jasmine Pang M.D.   On: 05/21/2023 17:13   DG Chest 2 View  Result Date: 05/21/2023 CLINICAL DATA:  Chest pain. EXAM: CHEST - 2 VIEW COMPARISON:  Chest x-ray April 18, 2013. FINDINGS: Subtle somewhat nodular opacities at the right lung apex. No visible pleural effusions or pneumothorax. Cardiomediastinal silhouette is unchanged. IMPRESSION: Subtle somewhat nodular opacities at the right lung apex. These could represent pleuroparenchymal scarring, but recommend CT of the chest to exclude pneumonia or malignancy. Electronically Signed   By: Feliberto Harts M.D.   On: 05/21/2023 11:23    Labs on Admission: I have personally reviewed following labs  CBC: Recent Labs  Lab 05/20/23 1659 05/21/23 1053  WBC 10.8* 9.4  NEUTROABS  7.2  --   HGB 13.5 13.4  HCT 38.7* 39.5  MCV 98.5 101.3*  PLT 233 199   Basic Metabolic Panel: Recent Labs  Lab 05/20/23 1659 05/21/23 1053  NA 140 139  K 4.8 5.0  CL 111 110  CO2 18* 19*  GLUCOSE 118* 185*  BUN 54* 50*  CREATININE 2.12* 2.04*  CALCIUM 10.8* 10.5*   GFR: Estimated Creatinine Clearance: 28.3 mL/min (A) (by C-G formula based on SCr of 2.04 mg/dL (H)).  Liver Function Tests: Recent Labs  Lab 05/20/23 1659  AST 22  ALT 36  ALKPHOS 41  BILITOT 1.0  PROT 7.1  ALBUMIN 4.1   Coagulation Profile: Recent Labs  Lab 05/21/23 1053  INR 1.0   Urine analysis:    Component Value Date/Time   COLORURINE Amber 06/20/2012 1926   APPEARANCEUR Hazy 06/20/2012 1926   LABSPEC 1.023 06/20/2012 1926   PHURINE 5.0 06/20/2012 1926   GLUCOSEU 50  mg/dL 29/56/2130 8657   HGBUR Negative 06/20/2012 1926   BILIRUBINUR Negative 06/20/2012 1926   KETONESUR Negative 06/20/2012 1926   PROTEINUR Negative 06/20/2012 1926   NITRITE Negative 06/20/2012 1926   LEUKOCYTESUR Negative 06/20/2012 1926   This document was prepared using Dragon Voice Recognition software and may include unintentional dictation errors.  Dr. Sedalia Muta [facility]  If 7PM-7AM, please contact overnight-coverage provider If 7AM-7PM, please contact day attending provider [email or web address]  05/21/2023, 7:29 PM

## 2023-05-21 NOTE — Assessment & Plan Note (Signed)
Complete echo has been ordered, workup in progress, suspect secondary to new onset heart failure Admit to telemetry cardiac, inpatient

## 2023-05-21 NOTE — Assessment & Plan Note (Signed)
Hydralazine 5 mg IV every 6 hours as needed for SBP greater 170, 5 days ordered

## 2023-05-21 NOTE — Assessment & Plan Note (Addendum)
Query atypical angina High sensitive troponin is downtrending and patient denies chest pressure and/or chest pain at bedside Nitroglycerin ointment 1 inch every 6 hours as needed for chest pain, 1 day ordered Complete echo ordered

## 2023-05-21 NOTE — Hospital Course (Signed)
Mr. Bobby Warner, [name] 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  Patient reports that he has been feeling weakness with exertion and fatigue.  Patient has had an unintentional 20 pound weight loss 1 year.  Vitals in the ED showed temperature 97.7, respiration rate of 20, heart rate of 77, blood pressure 150/69, SpO2 100% on room air.  Serum sodium is 139, potassium 5.0, chloride 110, bicarb 19, BUN of 50, serum creatinine of 2.04, EGFR 32, nonfasting blood glucose 185, WBC 9.4, platelets 199, hemoglobin 13.4.  BNP was 94.  High sensitive troponin was 27, and on repeat was 23.  EKG performed in the emergency department showed new atrial fibrillation, with rate of 85, QTc of 395.  ED treatment: DuoNebs one-time treatment, sodium chloride 1 L bolus x 2.

## 2023-05-22 ENCOUNTER — Other Ambulatory Visit: Payer: Self-pay

## 2023-05-22 ENCOUNTER — Inpatient Hospital Stay
Admit: 2023-05-22 | Discharge: 2023-05-22 | Disposition: A | Discharge status: Home / Self Care | Payer: Medicare Other | Attending: Cardiovascular Disease | Admitting: Cardiovascular Disease

## 2023-05-22 DIAGNOSIS — I4891 Unspecified atrial fibrillation: Secondary | ICD-10-CM | POA: Diagnosis not present

## 2023-05-22 DIAGNOSIS — R0609 Other forms of dyspnea: Secondary | ICD-10-CM | POA: Diagnosis not present

## 2023-05-22 DIAGNOSIS — R531 Weakness: Secondary | ICD-10-CM

## 2023-05-22 LAB — PHOSPHORUS: Phosphorus: 3 mg/dL (ref 2.5–4.6)

## 2023-05-22 LAB — COMPREHENSIVE METABOLIC PANEL
ALT: 36 U/L (ref 0–44)
AST: 31 U/L (ref 15–41)
Albumin: 3.3 g/dL — ABNORMAL LOW (ref 3.5–5.0)
Alkaline Phosphatase: 35 U/L — ABNORMAL LOW (ref 38–126)
Anion gap: 8 (ref 5–15)
BUN: 32 mg/dL — ABNORMAL HIGH (ref 8–23)
CO2: 20 mmol/L — ABNORMAL LOW (ref 22–32)
Calcium: 9 mg/dL (ref 8.9–10.3)
Chloride: 105 mmol/L (ref 98–111)
Creatinine, Ser: 1.65 mg/dL — ABNORMAL HIGH (ref 0.61–1.24)
GFR, Estimated: 41 mL/min — ABNORMAL LOW (ref >60)
Glucose, Bld: 138 mg/dL — ABNORMAL HIGH (ref 70–99)
Potassium: 4.1 mmol/L (ref 3.5–5.1)
Sodium: 133 mmol/L — ABNORMAL LOW (ref 135–145)
Total Bilirubin: 0.8 mg/dL (ref 0.3–1.2)
Total Protein: 5.7 g/dL — ABNORMAL LOW (ref 6.5–8.1)

## 2023-05-22 LAB — CBC
HCT: 34.2 % — ABNORMAL LOW (ref 39.0–52.0)
Hemoglobin: 11.8 g/dL — ABNORMAL LOW (ref 13.0–17.0)
MCH: 34.5 pg — ABNORMAL HIGH (ref 26.0–34.0)
MCHC: 34.5 g/dL (ref 30.0–36.0)
MCV: 100 fL (ref 80.0–100.0)
Platelets: 159 10*3/uL (ref 150–400)
RBC: 3.42 MIL/uL — ABNORMAL LOW (ref 4.22–5.81)
RDW: 13.6 % (ref 11.5–15.5)
WBC: 8.2 10*3/uL (ref 4.0–10.5)
nRBC: 0 % (ref 0.0–0.2)

## 2023-05-22 LAB — ECHOCARDIOGRAM COMPLETE: Height: 70 in

## 2023-05-22 LAB — MAGNESIUM: Magnesium: 1 mg/dL — ABNORMAL LOW (ref 1.7–2.4)

## 2023-05-22 LAB — CBG MONITORING, ED: Glucose-Capillary: 96 mg/dL (ref 70–99)

## 2023-05-22 MED ORDER — COLCHICINE 0.6 MG PO TABS
0.6000 mg | ORAL_TABLET | Freq: Two times a day (BID) | ORAL | Status: DC | PRN
  Administered 2023-05-22: 0.6 mg via ORAL
  Filled 2023-05-22: qty 1

## 2023-05-22 NOTE — ED Notes (Signed)
Pt cbg was 96, will notify Lauren, RN

## 2023-05-22 NOTE — Progress Notes (Signed)
  Echocardiogram 2D Echocardiogram has been performed.  Bobby Warner 05/22/2023, 4:26 PM

## 2023-05-22 NOTE — Progress Notes (Signed)
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  Patient has been having intermittent chest pressure for last 1 month.  He also reports having reflux and indigestion.  Patient reports unintentional 20 pound weight loss in 1 year.  He also reports generalized weakness with exertion and fatigue.  Significant labs in the ED BNP 94, troponin 27> 23, EKG shows new onset atrial fibrillation.  Patient is admitted for further evaluation.   Assessment & Plan:   Principal Problem:   Dyspnea on exertion Active Problems:   Atherosclerosis of abdominal aorta (HCC)   Benign localized hyperplasia of prostate with urinary obstruction   Coronary atherosclerosis   Diabetes mellitus with stage 3 chronic kidney disease (HCC)   GERD (gastroesophageal reflux disease)   HTN (hypertension), benign   OSA (obstructive sleep apnea)   PVD (peripheral vascular disease) (HCC)   Hyperlipidemia   CKD stage 3a, GFR 45-59 ml/min (HCC)   AKI (acute kidney injury) (HCC)   Memory changes   Chest pressure   Atrial fibrillation, new onset (HCC)   Dyspnea on exertion: Patient presented with shortness of breath on exertion, differential diagnosis could be broad. Troponins are slightly elevated, new onset heart failure could be a possibility. Procalcitonin 0.10, BNP 94. Monitor daily weight, intake output charting. Follow-up 2D echocardiogram.   Atrial fibrillation, new onset (HCC) Suspect secondary to new onset heart failure. Complete echo has been ordered, workup in progress, Continue telemetry.  Heart rate is controlled. Cardiology  is consulted.   Chest pressure: This could be atypical angina High sensitive troponin is downtrending and patient denies chest pressure and/or chest pain at bedside. Nitroglycerin ointment 1 inch every 6 hours as needed for chest pain, Follow up Echo and cardio evaluation.   Memory changes Consistent with early development of dementia Recommend outpatient follow-up with pcp for monitoring   AKI (acute kidney injury) (HCC) Suspect prerenal, secondary to cardiorenal. Baseline creatinine 1.08-1.13, creatinine on admission 2.12 Serum creatinine improved after IV fluid resuscitation.   Hyperlipidemia Atorvastatin 40 mg daily   PVD (peripheral vascular disease) (HCC) Status post left lower extremity revascularizations   OSA (obstructive sleep apnea) CPAP nightly    HTN (hypertension), benign Hydralazine 5 mg IV every 6 hours as needed for SBP greater 170, 5 days ordered   Coronary atherosclerosis  aspirin 81 mg daily and Plavix 75 mg daily   DVT prophylaxis: Heparin sq Code Status: Full code Family Communication: No family at bed side. Disposition Plan:    Status is: Inpatient Remains inpatient appropriate because: Admitted for chest discomfort, shortness of breath, elevated troponin, new onset A-fib.  Cardiology is consulted    Consultants:  Cardiology  Procedures: Echo   Antimicrobials: Anti-infectives (From admission, onward)    None      Subjective: Patient was seen and examined at bedside.  Overnight events noted. Patient denies any chest pain , states shortness of breath is improving .   Objective: Vitals:   05/22/23 0251 05/22/23 0600 05/22/23 0826 05/22/23 1301  BP: (!) 85/62 94/65 120/73 112/66  Pulse: 89 70 86 88  Resp: 16 16 16 16   Temp:   98 F (36.7 C) 97.8 F (36.6  C)  TempSrc:   Oral Oral  SpO2: 92% 98% 98% 97%  Weight:      Height:        Intake/Output Summary (Last 24 hours) at 05/22/2023 1421 Last data filed at 05/21/2023  2126 Gross per 24 hour  Intake 1000.21 ml  Output --  Net 1000.21 ml   Filed Weights   05/21/23 1047  Weight: 78.9 kg    Examination:  General exam: Appears calm and comfortable, not in any acute distress.  Deconditioned. Respiratory system: Clear to auscultation. Respiratory effort normal.  RR 14 Cardiovascular system: S1 & S2 heard, irregular rhythm, no murmur. Gastrointestinal system: Abdomen is soft, non tender, non distended, bowel sounds present. Central nervous system: Alert and oriented x 3. No focal neurological deficits. Extremities: No edema, no cyanosis, no clubbing Skin: No rashes, lesions or ulcers Psychiatry: Judgement and insight appear normal. Mood & affect appropriate.     Data Reviewed: I have personally reviewed following labs and imaging studies  CBC: Recent Labs  Lab 05/20/23 1659 05/21/23 1053  WBC 10.8* 9.4  NEUTROABS 7.2  --   HGB 13.5 13.4  HCT 38.7* 39.5  MCV 98.5 101.3*  PLT 233 199   Basic Metabolic Panel: Recent Labs  Lab 05/20/23 1659 05/21/23 1053  NA 140 139  K 4.8 5.0  CL 111 110  CO2 18* 19*  GLUCOSE 118* 185*  BUN 54* 50*  CREATININE 2.12* 2.04*  CALCIUM 10.8* 10.5*   GFR: Estimated Creatinine Clearance: 28.3 mL/min (A) (by C-G formula based on SCr of 2.04 mg/dL (H)). Liver Function Tests: Recent Labs  Lab 05/20/23 1659  AST 22  ALT 36  ALKPHOS 41  BILITOT 1.0  PROT 7.1  ALBUMIN 4.1   No results for input(s): "LIPASE", "AMYLASE" in the last 168 hours. No results for input(s): "AMMONIA" in the last 168 hours. Coagulation Profile: Recent Labs  Lab 05/21/23 1053  INR 1.0   Cardiac Enzymes: No results for input(s): "CKTOTAL", "CKMB", "CKMBINDEX", "TROPONINI" in the last 168 hours. BNP (last 3 results) No results for input(s): "PROBNP" in the last 8760 hours. HbA1C: No results for input(s): "HGBA1C" in the last 72 hours. CBG: Recent Labs  Lab 05/21/23 2104 05/22/23 0822  GLUCAP 109* 96   Lipid  Profile: No results for input(s): "CHOL", "HDL", "LDLCALC", "TRIG", "CHOLHDL", "LDLDIRECT" in the last 72 hours. Thyroid Function Tests: No results for input(s): "TSH", "T4TOTAL", "FREET4", "T3FREE", "THYROIDAB" in the last 72 hours. Anemia Panel: No results for input(s): "VITAMINB12", "FOLATE", "FERRITIN", "TIBC", "IRON", "RETICCTPCT" in the last 72 hours. Sepsis Labs: Recent Labs  Lab 05/21/23 1617  PROCALCITON <0.10    Recent Results (from the past 240 hour(s))  Blood culture (routine x 2)     Status: None (Preliminary result)   Collection Time: 05/21/23  4:14 PM   Specimen: BLOOD RIGHT ARM  Result Value Ref Range Status   Specimen Description BLOOD RIGHT ARM  Final   Special Requests   Final    BOTTLES DRAWN AEROBIC AND ANAEROBIC Blood Culture adequate volume   Culture   Final    NO GROWTH < 12 HOURS Performed at [facility], [address] [place], [place] [address]    Report Status PENDING  Incomplete  Blood culture (routine x 2)     Status: None (Preliminary result)   Collection Time: 05/21/23  4:14 PM   Specimen: BLOOD LEFT ARM  Result Value Ref Range Status   Specimen Description BLOOD LEFT ARM  Final   Special Requests   Final    BOTTLES DRAWN AEROBIC AND ANAEROBIC Blood Culture adequate volume   Culture   Final    NO GROWTH < 12 HOURS Performed at [facility], [address] [place], [place] [address]    Report Status PENDING  Incomplete  SARS Coronavirus 2 by RT PCR (hospital order, performed in [facility] hospital lab) *cepheid single result test* Anterior Nasal Swab     Status: None   Collection Time: 05/21/23  4:14 PM   Specimen: Anterior Nasal Swab  Result Value Ref Range Status   SARS Coronavirus 2 by RT PCR NEGATIVE NEGATIVE Final    Comment: (NOTE) SARS-CoV-2 target nucleic acids are NOT DETECTED.  The SARS-CoV-2 RNA is generally detectable in upper and lower respiratory specimens during the acute phase of infection. The  lowest concentration of SARS-CoV-2 viral copies this assay can detect is 250 copies / mL. A negative result does not preclude SARS-CoV-2 infection and should not be used as the sole basis for treatment or other patient management decisions.  A negative result may occur with improper specimen collection / handling, submission of specimen other than nasopharyngeal swab, presence of viral mutation(s) within the areas targeted by this assay, and inadequate number of viral copies (<250 copies / mL). A negative result must be combined with clinical observations, patient history, and epidemiological information.  Fact Sheet for Patients:   RoadLapTop.co.za  Fact Sheet for Healthcare Providers: http://kim-miller.com/  This test is not yet approved or  cleared by the [place] FDA and has been authorized for detection and/or diagnosis of SARS-CoV-2 by FDA under an Emergency Use Authorization (EUA).  This EUA will remain in effect (meaning this test can be used) for the duration of the COVID-19 declaration under Section 564(b)(1) of the Act, 21 U.S.C. section 360bbb-3(b)(1), unless the authorization is terminated or revoked sooner.  Performed at [facility], [address] [place], [place] [address]     Radiology Studies: US RENAL  Result Date: 05/21/2023 CLINICAL DATA:  Acute renal insufficiency. EXAM: RENAL / URINARY TRACT ULTRASOUND COMPLETE COMPARISON:  None Available. FINDINGS: Right Kidney: Renal measurements: 8.9 x 3.5 x 4.7 cm = volume: 75.2 mL. Echogenicity within normal limits. No mass or hydronephrosis visualized. Left Kidney: Renal measurements: 10.9 x 4.8 x 5.3 cm = volume: 144 mL. Echogenicity within normal limits. No mass or hydronephrosis visualized. Bladder: Appears normal for degree of bladder distention. Other: None. IMPRESSION: Normal renal ultrasound. Electronically Signed   By: Ted Mcalpine M.D.   On:  05/21/2023 21:53   CT Chest Wo Contrast  Result Date: 05/21/2023 CLINICAL DATA:  Respiratory illness abnormal lab values EXAM: CT CHEST WITHOUT CONTRAST TECHNIQUE: Multidetector CT imaging of the chest was performed following the standard protocol without IV contrast. RADIATION DOSE REDUCTION: This exam was performed according to the departmental dose-optimization program which includes automated exposure control, adjustment of the mA and/or kV according to patient size and/or use of iterative reconstruction technique. COMPARISON:  Chest x-ray 05/21/2023, CT 06/20/2012 FINDINGS: Cardiovascular: Limited evaluation without intravenous contrast. Mild aortic atherosclerosis. Aneurysmal dilatation of the distal arch/proximal descending thoracic aorta measuring up to 4 cm on sagittal series 6, image 103. Coronary vascular calcification. Normal cardiac size. No pericardial effusion Mediastinum/Nodes: Midline trachea. No thyroid mass. No suspicious lymph nodes. Interval hiatal hernia repair. Lungs/Pleura: Emphysema. No acute airspace disease, pleural effusion or pneumothorax. No suspicious pulmonary nodules. Mild lower lobe bronchiectasis Upper Abdomen: No acute finding. Suspect  large duodenal diverticulum, incompletely visualized Musculoskeletal: No acute osseous abnormality IMPRESSION: 1. No CT evidence for acute intrathoracic abnormality. 2. Emphysema.  No suspicious lung nodules. 3. Aneurysmal dilatation of the distal arch/proximal descending thoracic aorta up to 4 cm. Recommend semi-annual imaging followup by CTA or MRA and referral to cardiothoracic surgery if not already obtained. This recommendation follows 2010 ACCF/AHA/AATS/ACR/ASA/SCA/SCAI/SIR/STS/SVM Guidelines for the Diagnosis and Management of Patients With Thoracic Aortic Disease. Circulation. 2010; 121: G956-O13. Aortic aneurysm NOS (ICD10-I71.9) Aortic Atherosclerosis (ICD10-I70.0) and Emphysema (ICD10-J43.9). Electronically Signed   By: Jasmine Pang  M.D.   On: 05/21/2023 17:13   DG Chest 2 View  Result Date: 05/21/2023 CLINICAL DATA:  Chest pain. EXAM: CHEST - 2 VIEW COMPARISON:  Chest x-ray April 18, 2013. FINDINGS: Subtle somewhat nodular opacities at the right lung apex. No visible pleural effusions or pneumothorax. Cardiomediastinal silhouette is unchanged. IMPRESSION: Subtle somewhat nodular opacities at the right lung apex. These could represent pleuroparenchymal scarring, but recommend CT of the chest to exclude pneumonia or malignancy. Electronically Signed   By: Feliberto Harts M.D.   On: 05/21/2023 11:23    Scheduled Meds:  aspirin EC  81 mg Oral Daily   atorvastatin  40 mg Oral Daily   clopidogrel  75 mg Oral Daily   heparin  5,000 Units Subcutaneous Q8H   Continuous Infusions:   LOS: 1 day    Time spent: 50 mins    Willeen Niece, MD [facility]   If 7PM-7AM, please contact night-coverage

## 2023-05-22 NOTE — Consult Note (Signed)
Bobby KINT [name] is a 82 y.o. male  [id number]  Primary Cardiologist: [facility] cardiology Reason for Consultation: Atrial fibrillation  HPI: 82 year old white male who presented to the hospital with shortness of breath and chest pain was found to be in atrial fibrillation with rapid ventricular response today.  Patient is saying that he is feeling better since he has been here.  I was asked to evaluate the patient.   Review of Systems: No orthopnea PND or leg swelling   Past Medical History:  Diagnosis Date   Arthritis    Back pain    Coronary artery disease    Diabetes mellitus without complication (HCC)    GERD (gastroesophageal reflux disease)    History of hiatal hernia    Hyperlipidemia    Hypertension    Sleep apnea     Medications Prior to Admission  Medication Sig Dispense Refill   acetaminophen (TYLENOL) 500 MG tablet Take 1,000 mg by mouth every 6 (six) hours as needed.     aspirin 81 MG tablet Take 81 mg by mouth daily.     atorvastatin (LIPITOR) 40 MG tablet Take 40 mg by mouth daily.     cetirizine (ZYRTEC) 10 MG chewable tablet Chew 10 mg by mouth daily.     cholecalciferol (VITAMIN D3) 25 MCG (1000 UNIT) tablet Take 1,000 Units by mouth daily.     clopidogrel (PLAVIX) 75 MG tablet Take 1 tablet by mouth daily.     colchicine 0.6 MG tablet Take 0.6 mg by mouth 2 (two) times daily as needed.     doxycycline (VIBRA-TABS) 100 MG tablet Take 100 mg by mouth 2 (two) times daily.     glimepiride (AMARYL) 2 MG tablet TAKE 1 TABLET BY MOUTH ONCE DAILY WITH BREAKFAST     losartan (COZAAR) 100 MG tablet Take 100 mg by mouth daily.     metFORMIN (GLUCOPHAGE-XR) 500 MG 24 hr tablet Take 500 mg by mouth daily with breakfast.     omeprazole (PRILOSEC) 20 MG capsule Take 20 mg by mouth daily.     predniSONE (DELTASONE) 20 MG tablet Take 20 mg by mouth 2 (two) times daily.     albuterol (VENTOLIN HFA) 108 (90 Base) MCG/ACT inhaler Inhale into the lungs. (Patient not taking:  Reported on 05/20/2023)     b complex vitamins capsule Take 1 capsule by mouth daily. (Patient not taking: Reported on 05/21/2023)     Cetirizine HCl 10 MG CAPS Take by mouth. (Patient not taking: Reported on 05/21/2023)     ketorolac (ACULAR) 0.4 % SOLN 1 drop 4 (four) times daily. (Patient not taking: Reported on 05/21/2023)     losartan (COZAAR) 50 MG tablet Take 1 tablet by mouth daily. (Patient not taking: Reported on 05/21/2023)     metoprolol succinate (TOPROL-XL) 100 MG 24 hr tablet Take 100 mg by mouth daily. Take with or immediately following a meal. (Patient not taking: Reported on 05/21/2023)     vitamin B-12 (CYANOCOBALAMIN) 1000 MCG tablet Take 1,000 mcg by mouth daily. (Patient not taking: Reported on 05/21/2023)        aspirin EC  81 mg Oral Daily   atorvastatin  40 mg Oral Daily   clopidogrel  75 mg Oral Daily   heparin  5,000 Units Subcutaneous Q8H    Infusions:   Allergies  Allergen Reactions   Nsaids    Tolmetin     Social History   Socioeconomic History   Marital status: Married  Spouse name: Bobby Warner   Number of children: 1   Years of education: Not on file   Highest education level: Not on file  Occupational History   Not on file  Tobacco Use   Smoking status: Former    Current packs/day: 0.00    Average packs/day: 2.0 packs/day for 20.0 years (40.0 ttl pk-yrs)    Types: Cigarettes    Start date: 28    Quit date: 26    Years since quitting: 45.5   Smokeless tobacco: Never  Vaping Use   Vaping status: Never Used  Substance and Sexual Activity   Alcohol use: Yes    Alcohol/week: 1.0 standard drink of alcohol    Types: 1 Cans of beer per week    Comment: occassional    Drug use: No   Sexual activity: Not Currently  Other Topics Concern   Not on file  Social History Narrative   Not on file   Social Determinants of Health   Financial Resource Strain: Not on file  Food Insecurity: Not on file  Transportation Needs: Not on file   Physical Activity: Not on file  Stress: Not on file  Social Connections: Not on file  Intimate Partner Violence: Not on file    Family History  Problem Relation Age of Onset   Varicose Veins Neg Hx    Vision loss Neg Hx    Stroke Neg Hx     PHYSICAL EXAM: Vitals:   05/22/23 0826 05/22/23 1301  BP: 120/73 112/66  Pulse: 86 88  Resp: 16 16  Temp: 98 F (36.7 C) 97.8 F (36.6 C)  SpO2: 98% 97%     Intake/Output Summary (Last 24 hours) at 05/22/2023 1418 Last data filed at 05/21/2023 2126 Gross per 24 hour  Intake 1000.21 ml  Output --  Net 1000.21 ml    General:  Well appearing. No respiratory difficulty HEENT: normal Neck: supple. no JVD. Carotids 2+ bilat; no bruits. No lymphadenopathy or thryomegaly appreciated. Cor: PMI nondisplaced. Regular rate & rhythm. No rubs, gallops or murmurs. Lungs: clear Abdomen: soft, nontender, nondistended. No hepatosplenomegaly. No bruits or masses. Good bowel sounds. Extremities: no cyanosis, clubbing, rash, edema Neuro: alert & oriented x 3, cranial nerves grossly intact. moves all 4 extremities w/o difficulty. Affect pleasant.  ECG: Atrial fibrillation with frequent PVCs and nonspecific ST to T changes  Results for orders placed or performed during the hospital encounter of 05/21/23 (from the past 24 hour(s))  Troponin I (High Sensitivity)     Status: Abnormal   Collection Time: 05/21/23  3:07 PM  Result Value Ref Range   Troponin I (High Sensitivity) 23 (H) <18 ng/L  Blood culture (routine x 2)     Status: None (Preliminary result)   Collection Time: 05/21/23  4:14 PM   Specimen: BLOOD RIGHT ARM  Result Value Ref Range   Specimen Description BLOOD RIGHT ARM    Special Requests      BOTTLES DRAWN AEROBIC AND ANAEROBIC Blood Culture adequate volume   Culture      NO GROWTH < 12 HOURS Performed at [facility], [address] [place], [place] [address]    Report Status PENDING   Blood culture (routine x 2)      Status: None (Preliminary result)   Collection Time: 05/21/23  4:14 PM   Specimen: BLOOD LEFT ARM  Result Value Ref Range   Specimen Description BLOOD LEFT ARM    Special Requests  BOTTLES DRAWN AEROBIC AND ANAEROBIC Blood Culture adequate volume   Culture      NO GROWTH < 12 HOURS Performed at [facility], [address] [place], [place] [address]    Report Status PENDING   SARS Coronavirus 2 by RT PCR (hospital order, performed in [facility] hospital lab) *cepheid single result test* Anterior Nasal Swab     Status: None   Collection Time: 05/21/23  4:14 PM   Specimen: Anterior Nasal Swab  Result Value Ref Range   SARS Coronavirus 2 by RT PCR NEGATIVE NEGATIVE  Procalcitonin     Status: None   Collection Time: 05/21/23  4:17 PM  Result Value Ref Range   Procalcitonin <0.10 ng/mL  Brain natriuretic peptide     Status: None   Collection Time: 05/21/23  4:17 PM  Result Value Ref Range   B Natriuretic Peptide 94.0 0.0 - 100.0 pg/mL  CBG monitoring, ED     Status: Abnormal   Collection Time: 05/21/23  9:04 PM  Result Value Ref Range   Glucose-Capillary 109 (H) 70 - 99 mg/dL  CBG monitoring, ED     Status: None   Collection Time: 05/22/23  8:22 AM  Result Value Ref Range   Glucose-Capillary 96 70 - 99 mg/dL   US RENAL  Result Date: 05/21/2023 CLINICAL DATA:  Acute renal insufficiency. EXAM: RENAL / URINARY TRACT ULTRASOUND COMPLETE COMPARISON:  None Available. FINDINGS: Right Kidney: Renal measurements: 8.9 x 3.5 x 4.7 cm = volume: 75.2 mL. Echogenicity within normal limits. No mass or hydronephrosis visualized. Left Kidney: Renal measurements: 10.9 x 4.8 x 5.3 cm = volume: 144 mL. Echogenicity within normal limits. No mass or hydronephrosis visualized. Bladder: Appears normal for degree of bladder distention. Other: None. IMPRESSION: Normal renal ultrasound. Electronically Signed   By: Ted Mcalpine M.D.   On: 05/21/2023 21:53   CT Chest Wo Contrast  Result  Date: 05/21/2023 CLINICAL DATA:  Respiratory illness abnormal lab values EXAM: CT CHEST WITHOUT CONTRAST TECHNIQUE: Multidetector CT imaging of the chest was performed following the standard protocol without IV contrast. RADIATION DOSE REDUCTION: This exam was performed according to the departmental dose-optimization program which includes automated exposure control, adjustment of the mA and/or kV according to patient size and/or use of iterative reconstruction technique. COMPARISON:  Chest x-ray 05/21/2023, CT 06/20/2012 FINDINGS: Cardiovascular: Limited evaluation without intravenous contrast. Mild aortic atherosclerosis. Aneurysmal dilatation of the distal arch/proximal descending thoracic aorta measuring up to 4 cm on sagittal series 6, image 103. Coronary vascular calcification. Normal cardiac size. No pericardial effusion Mediastinum/Nodes: Midline trachea. No thyroid mass. No suspicious lymph nodes. Interval hiatal hernia repair. Lungs/Pleura: Emphysema. No acute airspace disease, pleural effusion or pneumothorax. No suspicious pulmonary nodules. Mild lower lobe bronchiectasis Upper Abdomen: No acute finding. Suspect large duodenal diverticulum, incompletely visualized Musculoskeletal: No acute osseous abnormality IMPRESSION: 1. No CT evidence for acute intrathoracic abnormality. 2. Emphysema.  No suspicious lung nodules. 3. Aneurysmal dilatation of the distal arch/proximal descending thoracic aorta up to 4 cm. Recommend semi-annual imaging followup by CTA or MRA and referral to cardiothoracic surgery if not already obtained. This recommendation follows 2010 ACCF/AHA/AATS/ACR/ASA/SCA/SCAI/SIR/STS/SVM Guidelines for the Diagnosis and Management of Patients With Thoracic Aortic Disease. Circulation. 2010; 121: U045-W09. Aortic aneurysm NOS (ICD10-I71.9) Aortic Atherosclerosis (ICD10-I70.0) and Emphysema (ICD10-J43.9). Electronically Signed   By: Jasmine Pang M.D.   On: 05/21/2023 17:13   DG Chest 2  View  Result Date: 05/21/2023 CLINICAL DATA:  Chest pain. EXAM: CHEST - 2  VIEW COMPARISON:  Chest x-ray April 18, 2013. FINDINGS: Subtle somewhat nodular opacities at the right lung apex. No visible pleural effusions or pneumothorax. Cardiomediastinal silhouette is unchanged. IMPRESSION: Subtle somewhat nodular opacities at the right lung apex. These could represent pleuroparenchymal scarring, but recommend CT of the chest to exclude pneumonia or malignancy. Electronically Signed   By: Feliberto Harts M.D.   On: 05/21/2023 11:23     ASSESSMENT AND PLAN: Atrial fibrillation appears to be new onset with symptoms of congestive heart failure.  Patient also has acute kidney injury with prerenal azotemia secondary to dehydration.  Right now on the monitor.  Patient appears to be in sinus rhythm.  Heart rate right now is 97.  Advised starting the patient on metoprolol and Eliquis.  Bobby Warner

## 2023-05-23 DIAGNOSIS — E1122 Type 2 diabetes mellitus with diabetic chronic kidney disease: Secondary | ICD-10-CM

## 2023-05-23 DIAGNOSIS — I4891 Unspecified atrial fibrillation: Secondary | ICD-10-CM | POA: Diagnosis not present

## 2023-05-23 DIAGNOSIS — E785 Hyperlipidemia, unspecified: Secondary | ICD-10-CM

## 2023-05-23 DIAGNOSIS — I739 Peripheral vascular disease, unspecified: Secondary | ICD-10-CM

## 2023-05-23 DIAGNOSIS — R0609 Other forms of dyspnea: Secondary | ICD-10-CM | POA: Diagnosis not present

## 2023-05-23 DIAGNOSIS — N183 Chronic kidney disease, stage 3 unspecified: Secondary | ICD-10-CM

## 2023-05-23 LAB — ECHOCARDIOGRAM COMPLETE
AR max vel: 2.3 cm2
AV Peak grad: 11.3 mmHg
Ao pk vel: 1.68 m/s
Area-P 1/2: 3.01 cm2
S' Lateral: 2.5 cm
Weight: 2784 oz

## 2023-05-23 LAB — MAGNESIUM: Magnesium: 1.1 mg/dL — ABNORMAL LOW (ref 1.7–2.4)

## 2023-05-23 LAB — BASIC METABOLIC PANEL
Anion gap: 9 (ref 5–15)
BUN: 31 mg/dL — ABNORMAL HIGH (ref 8–23)
CO2: 22 mmol/L (ref 22–32)
Calcium: 9.1 mg/dL (ref 8.9–10.3)
Chloride: 104 mmol/L (ref 98–111)
Creatinine, Ser: 1.52 mg/dL — ABNORMAL HIGH (ref 0.61–1.24)
GFR, Estimated: 45 mL/min — ABNORMAL LOW (ref >60)
Glucose, Bld: 155 mg/dL — ABNORMAL HIGH (ref 70–99)
Potassium: 4.3 mmol/L (ref 3.5–5.1)
Sodium: 135 mmol/L (ref 135–145)

## 2023-05-23 MED ORDER — MAGNESIUM SULFATE 4 GM/100ML IV SOLN
4.0000 g | Freq: Once | INTRAVENOUS | Status: AC
  Administered 2023-05-23: 4 g via INTRAVENOUS
  Filled 2023-05-23: qty 100

## 2023-05-23 MED ORDER — MAGNESIUM 400 MG PO TABS: 400.0000 mg | ORAL_TABLET | Freq: Every day | ORAL | 0 refills | Status: AC

## 2023-05-23 MED ORDER — METOPROLOL SUCCINATE ER 25 MG PO TB24: 25.0000 mg | ORAL_TABLET | Freq: Every day | ORAL | 1 refills | Status: AC

## 2023-05-23 MED ORDER — APIXABAN 2.5 MG PO TABS: 2.5000 mg | ORAL_TABLET | Freq: Two times a day (BID) | ORAL | 1 refills | Status: AC

## 2023-05-23 MED ORDER — APIXABAN 2.5 MG PO TABS: 2.5000 mg | ORAL_TABLET | Freq: Two times a day (BID) | ORAL | Status: DC

## 2023-05-23 MED ORDER — METOPROLOL SUCCINATE ER 25 MG PO TB24
25.0000 mg | ORAL_TABLET | Freq: Every day | ORAL | Status: DC
  Administered 2023-05-23: 25 mg via ORAL
  Filled 2023-05-23: qty 1

## 2023-05-23 NOTE — Discharge Summary (Signed)
Physician Discharge Summary   Patient: Bobby ROSTEN [name] MRN: [id number] DOB: 1941/05/19  Admit date:     05/21/2023  Discharge date: 05/23/23  Discharge Physician: Arnetha Courser   PCP: Lauro Regulus, MD   Recommendations at discharge:  Please obtain CBC, BMP and magnesium levels during next follow-up visit Follow-up with primary care provider Follow-up with cardiology Patient was on aspirin and Plavix, Plavix was discontinued as he is being started on Eliquis for concern of atrial fibrillation.  Please reevaluate to for continuation of aspirin  Discharge Diagnoses: Principal Problem:   Dyspnea on exertion Active Problems:   Atherosclerosis of abdominal aorta (HCC)   Benign localized hyperplasia of prostate with urinary obstruction   Coronary atherosclerosis   Diabetes mellitus with stage 3 chronic kidney disease (HCC)   GERD (gastroesophageal reflux disease)   HTN (hypertension), benign   OSA (obstructive sleep apnea)   PVD (peripheral vascular disease) (HCC)   Hyperlipidemia   CKD stage 3a, GFR 45-59 ml/min (HCC)   AKI (acute kidney injury) (HCC)   Memory changes   Chest pressure   Atrial fibrillation, new onset [facility])   Hospital Course: 82 year old male with PMH significant of hyperlipidemia, hypertension, non-insulin-dependent diabetes mellitus, GERD, CKD stage IIIa,  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  Patient has been having intermittent chest pressure for last 1 month.  He also reports having reflux and indigestion.  Patient reports unintentional 20 pound weight loss in 1 year.  He also reports generalized weakness with exertion and fatigue.  Significant labs in the ED BNP 94, troponin 27> 23, EKG shows new onset atrial fibrillation.  Patient is admitted for further evaluation.   Cardiology was consulted and patient was spontaneously  converted back to sinus rhythm.  Also found to have significant hypomagnesemia with magnesium at 1, can be contributory to abnormal rhythm.  Cardiology started him on low-dose metoprolol and Eliquis.  His home Plavix was discontinued and he will continue on aspirin with Eliquis.  Need to have a discussion with his providers regarding continuation of aspirin.  Rate remained well-controlled and patient remains in normal sinus rhythm.  Echocardiogram was without any acute abnormality.  BNP was 94 and he need to follow-up with his cardiologist for further recommendations.  AKI improved and renal functions were close to baseline.  Blood cultures were obtained in the ED and remain negative.  Patient will continue on current medications and need to have a close follow-up with his providers for further recommendations.  Consultants: Cardiology Procedures performed: None Disposition: Home Diet recommendation:  Discharge Diet Orders (From admission, onward)     Start     Ordered   05/23/23 0000  Diet - low sodium heart healthy        05/23/23 1245           Cardiac diet DISCHARGE MEDICATION: Allergies as of 05/23/2023       Reactions   Nsaids    Tolmetin         Medication List     STOP taking these medications    albuterol 108 (90 Base) MCG/ACT inhaler Commonly known as: VENTOLIN HFA   b complex vitamins capsule   clopidogrel 75 MG tablet Commonly known as: PLAVIX   doxycycline 100 MG tablet Commonly known as: VIBRA-TABS   ketorolac 0.4 % Soln Commonly known as: ACULAR   predniSONE 20 MG tablet Commonly known as:  DELTASONE       TAKE these medications    acetaminophen 500 MG tablet Commonly known as: TYLENOL Take 1,000 mg by mouth every 6 (six) hours as needed.   apixaban 2.5 MG Tabs tablet Commonly known as: ELIQUIS Take 1 tablet (2.5 mg total) by mouth 2 (two) times daily.   aspirin 81 MG tablet Take 81 mg by mouth daily.   atorvastatin 40 MG  tablet Commonly known as: LIPITOR Take 40 mg by mouth daily.   cetirizine 10 MG chewable tablet Commonly known as: ZYRTEC Chew 10 mg by mouth daily. What changed: Another medication with the same name was removed. Continue taking this medication, and follow the directions you see here.   cholecalciferol 25 MCG (1000 UNIT) tablet Commonly known as: VITAMIN D3 Take 1,000 Units by mouth daily.   colchicine 0.6 MG tablet Take 0.6 mg by mouth 2 (two) times daily as needed.   cyanocobalamin 1000 MCG tablet Commonly known as: VITAMIN B12 Take 1,000 mcg by mouth daily.   glimepiride 2 MG tablet Commonly known as: AMARYL TAKE 1 TABLET BY MOUTH ONCE DAILY WITH BREAKFAST   losartan 100 MG tablet Commonly known as: COZAAR Take 100 mg by mouth daily. What changed: Another medication with the same name was removed. Continue taking this medication, and follow the directions you see here.   Magnesium 400 MG Tabs Take 400 mg by mouth daily.   metFORMIN 500 MG 24 hr tablet Commonly known as: GLUCOPHAGE-XR Take 500 mg by mouth daily with breakfast.   metoprolol succinate 25 MG 24 hr tablet Commonly known as: TOPROL-XL Take 1 tablet (25 mg total) by mouth daily. Start taking on: May 24, 2023 What changed:  medication strength how much to take additional instructions   omeprazole 20 MG capsule Commonly known as: PRILOSEC Take 20 mg by mouth daily.        Follow-up Information     Lauro Regulus, MD. Schedule an appointment as soon as possible for a visit in 1 week(s).   Specialty: Internal Medicine Contact information: [address] [facility] [place] [address] [phone]         Marcina Millard, MD. Schedule an appointment as soon as possible for a visit in 1 week(s).   Specialty: Cardiology Contact information: [address] [facility] [place] [address] [phone]                 Discharge Exam: Ceasar Mons Weights   05/21/23 1047  Weight: 78.9 kg   General.  Well-developed elderly man, in no acute distress. Pulmonary.  Lungs clear bilaterally, normal respiratory effort. CV.  Regular rate and rhythm, no JVD, rub or murmur. Abdomen.  Soft, nontender, nondistended, BS positive. CNS.  Alert and oriented .  No focal neurologic deficit. Extremities.  No edema, no cyanosis, pulses intact and symmetrical. Psychiatry.  Judgment and insight appears normal.   Condition at discharge: stable  The results of significant diagnostics from this hospitalization (including imaging, microbiology, ancillary and laboratory) are listed below for reference.   Imaging Studies: ECHOCARDIOGRAM COMPLETE  Result Date: 05/23/2023    ECHOCARDIOGRAM REPORT   Patient Name:   Bobby MELITO [name] Date of Exam: 05/22/2023 Medical Rec #:  [id number]           Height:       70.0 in Accession #:    [id number]          Weight:  174.0 lb Date of Birth:  Mar 10, 1941          BSA:          1.967 m Patient Age:    81 years            BP:           131/74 mmHg Patient Gender: M                   HR:           91 bpm. Exam Location:  [facility] Procedure: 2D Echo Indications:     Dyspnea R06.00                  Elevated Troponin  History:         Patient has no prior history of Echocardiogram examinations.  Sonographer:     Overton Mam RDCS, FASE Referring Phys:  Lajuana Carry A KHAN Diagnosing Phys: Adrian Blackwater  Sonographer Comments: Technically difficult study due to poor echo windows. Image acquisition challenging due to respiratory motion. IMPRESSIONS  1. Left ventricular ejection fraction, by estimation, is 50 to 55%. The left ventricle has low normal function. The left ventricle has no regional wall motion abnormalities. The left ventricular internal cavity size was mildly dilated. There is moderate  left ventricular hypertrophy. Left ventricular diastolic parameters are consistent with Grade I diastolic  dysfunction (impaired relaxation).  2. Right ventricular systolic function is mildly reduced. The right ventricular size is mildly enlarged. Mildly increased right ventricular wall thickness.  3. Left atrial size was moderately dilated.  4. Right atrial size was moderately dilated.  5. The mitral valve is grossly normal. Mild mitral valve regurgitation.  6. The aortic valve is calcified. Aortic valve regurgitation is mild. Aortic valve sclerosis/calcification is present, without any evidence of aortic stenosis. FINDINGS  Left Ventricle: Left ventricular ejection fraction, by estimation, is 50 to 55%. The left ventricle has low normal function. The left ventricle has no regional wall motion abnormalities. The left ventricular internal cavity size was mildly dilated. There is moderate left ventricular hypertrophy. Left ventricular diastolic parameters are consistent with Grade I diastolic dysfunction (impaired relaxation). Right Ventricle: The right ventricular size is mildly enlarged. Mildly increased right ventricular wall thickness. Right ventricular systolic function is mildly reduced. Left Atrium: Left atrial size was moderately dilated. Right Atrium: Right atrial size was moderately dilated. Pericardium: There is no evidence of pericardial effusion. The pericardial effusion is circumferential. Mitral Valve: The mitral valve is grossly normal. Mild mitral valve regurgitation. Tricuspid Valve: The tricuspid valve is grossly normal. Tricuspid valve regurgitation is mild. Aortic Valve: The aortic valve is calcified. Aortic valve regurgitation is mild. Aortic valve sclerosis/calcification is present, without any evidence of aortic stenosis. Aortic valve peak gradient measures 11.3 mmHg. Pulmonic Valve: The pulmonic valve was grossly normal. Pulmonic valve regurgitation is trivial. Aorta: The aortic root, ascending aorta and aortic arch are all structurally normal, with no evidence of dilitation or obstruction.  IAS/Shunts: No atrial level shunt detected by color flow Doppler.  LEFT VENTRICLE PLAX 2D LVIDd:         3.40 cm   Diastology LVIDs:         2.50 cm   LV e' medial:    9.79 cm/s LV PW:         1.30 cm   LV E/e' medial:  4.8 LV IVS:        1.50 cm   LV e' lateral:  7.07 cm/s LVOT diam:     1.90 cm   LV E/e' lateral: 6.6 LV SV:         60 LV SV Index:   30 LVOT Area:     2.84 cm  RIGHT VENTRICLE RV Basal diam:  2.30 cm LEFT ATRIUM             Index LA diam:        3.10 cm 1.58 cm/m LA Vol (A2C):   26.3 ml 13.37 ml/m LA Vol (A4C):   24.6 ml 12.50 ml/m LA Biplane Vol: 25.5 ml 12.96 ml/m  AORTIC VALVE                 PULMONIC VALVE AV Area (Vmax): 2.30 cm     PV Vmax:       1.06 m/s AV Vmax:        168.00 cm/s  PV Peak grad:  4.5 mmHg AV Peak Grad:   11.3 mmHg LVOT Vmax:      136.00 cm/s LVOT Vmean:     87.900 cm/s LVOT VTI:       0.211 m  AORTA Ao Root diam: 3.40 cm Ao Asc diam:  3.50 cm MITRAL VALVE MV Area (PHT): 3.01 cm     SHUNTS MV Decel Time: 252 msec     Systemic VTI:  0.21 m MV E velocity: 46.60 cm/s   Systemic Diam: 1.90 cm MV A velocity: 107.00 cm/s MV E/A ratio:  0.44 Shaukat Khan Electronically signed by Adrian Blackwater Signature Date/Time: 05/23/2023/11:53:41 AM    Final    US RENAL  Result Date: 05/21/2023 CLINICAL DATA:  Acute renal insufficiency. EXAM: RENAL / URINARY TRACT ULTRASOUND COMPLETE COMPARISON:  None Available. FINDINGS: Right Kidney: Renal measurements: 8.9 x 3.5 x 4.7 cm = volume: 75.2 mL. Echogenicity within normal limits. No mass or hydronephrosis visualized. Left Kidney: Renal measurements: 10.9 x 4.8 x 5.3 cm = volume: 144 mL. Echogenicity within normal limits. No mass or hydronephrosis visualized. Bladder: Appears normal for degree of bladder distention. Other: None. IMPRESSION: Normal renal ultrasound. Electronically Signed   By: Ted Mcalpine M.D.   On: 05/21/2023 21:53   CT Chest Wo Contrast  Result Date: 05/21/2023 CLINICAL DATA:  Respiratory illness abnormal lab  values EXAM: CT CHEST WITHOUT CONTRAST TECHNIQUE: Multidetector CT imaging of the chest was performed following the standard protocol without IV contrast. RADIATION DOSE REDUCTION: This exam was performed according to the departmental dose-optimization program which includes automated exposure control, adjustment of the mA and/or kV according to patient size and/or use of iterative reconstruction technique. COMPARISON:  Chest x-ray 05/21/2023, CT 06/20/2012 FINDINGS: Cardiovascular: Limited evaluation without intravenous contrast. Mild aortic atherosclerosis. Aneurysmal dilatation of the distal arch/proximal descending thoracic aorta measuring up to 4 cm on sagittal series 6, image 103. Coronary vascular calcification. Normal cardiac size. No pericardial effusion Mediastinum/Nodes: Midline trachea. No thyroid mass. No suspicious lymph nodes. Interval hiatal hernia repair. Lungs/Pleura: Emphysema. No acute airspace disease, pleural effusion or pneumothorax. No suspicious pulmonary nodules. Mild lower lobe bronchiectasis Upper Abdomen: No acute finding. Suspect large duodenal diverticulum, incompletely visualized Musculoskeletal: No acute osseous abnormality IMPRESSION: 1. No CT evidence for acute intrathoracic abnormality. 2. Emphysema.  No suspicious lung nodules. 3. Aneurysmal dilatation of the distal arch/proximal descending thoracic aorta up to 4 cm. Recommend semi-annual imaging followup by CTA or MRA and referral to cardiothoracic surgery if not already obtained. This recommendation follows 2010 ACCF/AHA/AATS/ACR/ASA/SCA/SCAI/SIR/STS/SVM Guidelines for the Diagnosis and Management of Patients With  Thoracic Aortic Disease. Circulation. 2010; 121: Z610-R60. Aortic aneurysm NOS (ICD10-I71.9) Aortic Atherosclerosis (ICD10-I70.0) and Emphysema (ICD10-J43.9). Electronically Signed   By: Jasmine Pang M.D.   On: 05/21/2023 17:13   DG Chest 2 View  Result Date: 05/21/2023 CLINICAL DATA:  Chest pain. EXAM: CHEST - 2  VIEW COMPARISON:  Chest x-ray April 18, 2013. FINDINGS: Subtle somewhat nodular opacities at the right lung apex. No visible pleural effusions or pneumothorax. Cardiomediastinal silhouette is unchanged. IMPRESSION: Subtle somewhat nodular opacities at the right lung apex. These could represent pleuroparenchymal scarring, but recommend CT of the chest to exclude pneumonia or malignancy. Electronically Signed   By: Feliberto Harts M.D.   On: 05/21/2023 11:23    Microbiology: Results for orders placed or performed during the hospital encounter of 05/21/23  Blood culture (routine x 2)     Status: None (Preliminary result)   Collection Time: 05/21/23  4:14 PM   Specimen: BLOOD RIGHT ARM  Result Value Ref Range Status   Specimen Description BLOOD RIGHT ARM  Final   Special Requests   Final    BOTTLES DRAWN AEROBIC AND ANAEROBIC Blood Culture adequate volume   Culture   Final    NO GROWTH 2 DAYS Performed at [facility], [address] [place], [place] [address]    Report Status PENDING  Incomplete  Blood culture (routine x 2)     Status: None (Preliminary result)   Collection Time: 05/21/23  4:14 PM   Specimen: BLOOD LEFT ARM  Result Value Ref Range Status   Specimen Description BLOOD LEFT ARM  Final   Special Requests   Final    BOTTLES DRAWN AEROBIC AND ANAEROBIC Blood Culture adequate volume   Culture   Final    NO GROWTH 2 DAYS Performed at [facility], [address], [place], [place] [address]    Report Status PENDING  Incomplete  SARS Coronavirus 2 by RT PCR (hospital order, performed in [facility] hospital lab) *cepheid single result test* Anterior Nasal Swab     Status: None   Collection Time: 05/21/23  4:14 PM   Specimen: Anterior Nasal Swab  Result Value Ref Range Status   SARS Coronavirus 2 by RT PCR NEGATIVE NEGATIVE Final    Comment: (NOTE) SARS-CoV-2 target nucleic acids are NOT DETECTED.  The SARS-CoV-2 RNA is generally detectable in upper and  lower respiratory specimens during the acute phase of infection. The lowest concentration of SARS-CoV-2 viral copies this assay can detect is 250 copies / mL. A negative result does not preclude SARS-CoV-2 infection and should not be used as the sole basis for treatment or other patient management decisions.  A negative result may occur with improper specimen collection / handling, submission of specimen other than nasopharyngeal swab, presence of viral mutation(s) within the areas targeted by this assay, and inadequate number of viral copies (<250 copies / mL). A negative result must be combined with clinical observations, patient history, and epidemiological information.  Fact Sheet for Patients:   RoadLapTop.co.za  Fact Sheet for Healthcare Providers: http://kim-miller.com/  This test is not yet approved or  cleared by the [place] FDA and has been authorized for detection and/or diagnosis of SARS-CoV-2 by FDA under an Emergency Use Authorization (EUA).  This EUA will remain in effect (meaning this test can be used) for the duration of the COVID-19 declaration under Section 564(b)(1) of the Act, 21 U.S.C. section 360bbb-3(b)(1), unless the authorization is terminated or revoked sooner.  Performed at [facility], [address] [place], [place] [address]     Labs: CBC: Recent Labs  Lab 05/20/23 1659 05/21/23 1053 05/22/23 1418  WBC 10.8* 9.4 8.2  NEUTROABS 7.2  --   --   HGB 13.5 13.4 11.8*  HCT 38.7* 39.5 34.2*  MCV 98.5 101.3* 100.0  PLT 233 199 159   Basic Metabolic Panel: Recent Labs  Lab 05/20/23 1659 05/21/23 1053 05/22/23 1418 05/23/23 0913  NA 140 139 133* 135  K 4.8 5.0 4.1 4.3  CL 111 110 105 104  CO2 18* 19* 20* 22  GLUCOSE 118* 185* 138* 155*  BUN 54* 50* 32* 31*  CREATININE 2.12* 2.04* 1.65* 1.52*  CALCIUM 10.8* 10.5* 9.0 9.1  MG  --   --  1.0* 1.1*  PHOS  --   --  3.0  --     Liver Function Tests: Recent Labs  Lab 05/20/23 1659 05/22/23 1418  AST 22 31  ALT 36 36  ALKPHOS 41 35*  BILITOT 1.0 0.8  PROT 7.1 5.7*  ALBUMIN 4.1 3.3*   CBG: Recent Labs  Lab 05/21/23 2104 05/22/23 0822  GLUCAP 109* 96    Discharge time spent: greater than 30 minutes.  This record has been created using Conservation officer, historic buildings. Errors have been sought and corrected,but may not always be located. Such creation errors do not reflect on the standard of care.   Signed: Arnetha Courser, MD [facility] 05/23/2023

## 2023-05-23 NOTE — Consult Note (Signed)
ANTICOAGULATION CONSULT NOTE - Initial Consult  Pharmacy Consult for Eliquis Indication: atrial fibrillation  Allergies  Allergen Reactions   Nsaids    Tolmetin     Patient Measurements: Height: 5\' 10"  (177.8 cm) Weight: 78.9 kg (174 lb) IBW/kg (Calculated) : 73   Vital Signs: Temp: 97.6 F (36.4 C) (07/14 1137) Temp Source: Oral (07/14 0428) BP: 122/63 (07/14 1137) Pulse Rate: 73 (07/14 1137)  Labs: Recent Labs    05/20/23 1659 05/21/23 1053 05/21/23 1507 05/22/23 1418 05/23/23 0913  HGB 13.5 13.4  --  11.8*  --   HCT 38.7* 39.5  --  34.2*  --   PLT 233 199  --  159  --   LABPROT  --  13.2  --   --   --   INR  --  1.0  --   --   --   CREATININE 2.12* 2.04*  --  1.65* 1.52*  TROPONINIHS  --  27* 23*  --   --     Estimated Creatinine Clearance: 38 mL/min (A) (by C-G formula based on SCr of 1.52 mg/dL (H)).   Medical History: Past Medical History:  Diagnosis Date   Arthritis    Back pain    Coronary artery disease    Diabetes mellitus without complication (HCC)    GERD (gastroesophageal reflux disease)    History of hiatal hernia    Hyperlipidemia    Hypertension    Sleep apnea     Assessment: Patient with hx of CAD, DM, HTN, PVD, CKD-3a. Diagnosed with hypomagnesemia and new onset Afib. Pharmacy consulted to initiate Apixaban.   Plan:  Eliqius (Apixaban) 2.5mg  po BID (age 82. Scr 1.52, Wt 78.9kg)  Keishawn Rajewski Rodriguez-Guzman PharmD, BCPS 05/23/2023 12:30 PM

## 2023-05-23 NOTE — Final Consult Note (Signed)
SUBJECTIVE:  Patient is feeling much better denies any chest pain or shortness of breath.  Vitals:   05/22/23 2355 05/23/23 0428 05/23/23 0901 05/23/23 1137  BP: (!) 116/56 99/69 118/68 122/63  Pulse: 77 77 76 73  Resp: 16 17 18 18   Temp: 98.3 F (36.8 C) 97.9 F (36.6 C) 98.1 F (36.7 C) 97.6 F (36.4 C)  TempSrc: Oral Oral    SpO2: 96% 96% 98% 96%  Weight:      Height:       No intake or output data in the 24 hours ending 05/23/23 1155  LABS: Basic Metabolic Panel: Recent Labs    05/22/23 1418 05/23/23 0913  NA 133* 135  K 4.1 4.3  CL 105 104  CO2 20* 22  GLUCOSE 138* 155*  BUN 32* 31*  CREATININE 1.65* 1.52*  CALCIUM 9.0 9.1  MG 1.0* 1.1*  PHOS 3.0  --    Liver Function Tests: Recent Labs    05/20/23 1659 05/22/23 1418  AST 22 31  ALT 36 36  ALKPHOS 41 35*  BILITOT 1.0 0.8  PROT 7.1 5.7*  ALBUMIN 4.1 3.3*   No results for input(s): "LIPASE", "AMYLASE" in the last 72 hours. CBC: Recent Labs    05/20/23 1659 05/21/23 1053 05/22/23 1418  WBC 10.8* 9.4 8.2  NEUTROABS 7.2  --   --   HGB 13.5 13.4 11.8*  HCT 38.7* 39.5 34.2*  MCV 98.5 101.3* 100.0  PLT 233 199 159   Cardiac Enzymes: No results for input(s): "CKTOTAL", "CKMB", "CKMBINDEX", "TROPONINI" in the last 72 hours. BNP: Invalid input(s): "POCBNP" D-Dimer: No results for input(s): "DDIMER" in the last 72 hours. Hemoglobin A1C: No results for input(s): "HGBA1C" in the last 72 hours. Fasting Lipid Panel: No results for input(s): "CHOL", "HDL", "LDLCALC", "TRIG", "CHOLHDL", "LDLDIRECT" in the last 72 hours. Thyroid Function Tests: No results for input(s): "TSH", "T4TOTAL", "T3FREE", "THYROIDAB" in the last 72 hours.  Invalid input(s): "FREET3" Anemia Panel: No results for input(s): "VITAMINB12", "FOLATE", "FERRITIN", "TIBC", "IRON", "RETICCTPCT" in the last 72 hours.   PHYSICAL EXAM General: Well developed, well nourished, in no acute distress HEENT:  Normocephalic and  atramatic Neck:  No JVD.  Lungs: Clear bilaterally to auscultation and percussion. Heart: HRRR . Normal S1 and S2 without gallops or murmurs.  Abdomen: Bowel sounds are positive, abdomen soft and non-tender  Msk:  Back normal, normal gait. Normal strength and tone for age. Extremities: No clubbing, cyanosis or edema.   Neuro: Alert and oriented X 3. Psych:  Good affect, responds appropriately  TELEMETRY: Normal sinus rhythm 96 bpm  ASSESSMENT AND PLAN: Paroxysmal atrial fibrillation with hypomagnesemia with magnesium being 1.0.  Agree with giving IV magnesium and discharged the patient with a follow-up with Dr. Daisy Blossom 1 to 2 weeks.  Advised metoprolol succinate 25 mg p.o. once a day and Eliquis 5 mg p.o. twice daily.  Echocardiogram showed left ventricular ejection fraction 53% with probably calcified aortic valve and mild aortic regurgitation and mild to tricuspid regurgitation.  Patient can be discharged.   ICD-10-CM   1. AKI (acute kidney injury) (HCC)  N17.9     2. Weakness  R53.1     3. Acute cough  R05.1       Principal Problem:   Dyspnea on exertion Active Problems:   Atherosclerosis of abdominal aorta (HCC)   Benign localized hyperplasia of prostate with urinary obstruction   Coronary atherosclerosis   Diabetes mellitus with stage 3 chronic kidney disease (HCC)  GERD (gastroesophageal reflux disease)   HTN (hypertension), benign   OSA (obstructive sleep apnea)   PVD (peripheral vascular disease) (HCC)   Hyperlipidemia   CKD stage 3a, GFR 45-59 ml/min (HCC)   AKI (acute kidney injury) (HCC)   Memory changes   Chest pressure   Atrial fibrillation, new onset [facility])    Adrian Blackwater, MD, [facility] 05/23/2023 11:55 AM

## 2023-05-24 LAB — CULTURE, BLOOD (ROUTINE X 2): Culture: NO GROWTH

## 2023-05-25 ENCOUNTER — Ambulatory Visit: Admission: RE | Admit: 2023-05-25 | Payer: Medicare Other | Source: Ambulatory Visit

## 2023-05-26 LAB — CULTURE, BLOOD (ROUTINE X 2)
Culture: NO GROWTH
Special Requests: ADEQUATE
Special Requests: ADEQUATE

## 2023-06-21 ENCOUNTER — Emergency Department
Admission: EM | Admit: 2023-06-21 | Discharge: 2023-06-21 | Disposition: A | Discharge status: Home / Self Care | Payer: Medicare Other | Attending: Emergency Medicine | Admitting: Emergency Medicine

## 2023-06-21 ENCOUNTER — Other Ambulatory Visit: Payer: Self-pay

## 2023-06-21 DIAGNOSIS — E119 Type 2 diabetes mellitus without complications: Secondary | ICD-10-CM | POA: Insufficient documentation

## 2023-06-21 DIAGNOSIS — R42 Dizziness and giddiness: Secondary | ICD-10-CM | POA: Diagnosis present

## 2023-06-21 DIAGNOSIS — T671XXA Heat syncope, initial encounter: Secondary | ICD-10-CM | POA: Insufficient documentation

## 2023-06-21 DIAGNOSIS — I10 Essential (primary) hypertension: Secondary | ICD-10-CM | POA: Diagnosis not present

## 2023-06-21 DIAGNOSIS — E860 Dehydration: Secondary | ICD-10-CM | POA: Diagnosis not present

## 2023-06-21 LAB — URINALYSIS, ROUTINE W REFLEX MICROSCOPIC
Bilirubin Urine: NEGATIVE
Glucose, UA: NEGATIVE mg/dL
Hgb urine dipstick: NEGATIVE
Ketones, ur: NEGATIVE mg/dL
Leukocytes,Ua: NEGATIVE
Nitrite: NEGATIVE
Protein, ur: 30 mg/dL — AB
Specific Gravity, Urine: 1.015 (ref 1.005–1.030)
pH: 5 (ref 5.0–8.0)

## 2023-06-21 LAB — CBC
HCT: 33.1 % — ABNORMAL LOW (ref 39.0–52.0)
Hemoglobin: 11 g/dL — ABNORMAL LOW (ref 13.0–17.0)
MCH: 34.8 pg — ABNORMAL HIGH (ref 26.0–34.0)
MCHC: 33.2 g/dL (ref 30.0–36.0)
MCV: 104.7 fL — ABNORMAL HIGH (ref 80.0–100.0)
Platelets: 214 10*3/uL (ref 150–400)
RBC: 3.16 MIL/uL — ABNORMAL LOW (ref 4.22–5.81)
RDW: 13.8 % (ref 11.5–15.5)
WBC: 7.7 10*3/uL (ref 4.0–10.5)
nRBC: 0 % (ref 0.0–0.2)

## 2023-06-21 LAB — BASIC METABOLIC PANEL
Anion gap: 11 (ref 5–15)
BUN: 27 mg/dL — ABNORMAL HIGH (ref 8–23)
CO2: 20 mmol/L — ABNORMAL LOW (ref 22–32)
Calcium: 9.8 mg/dL (ref 8.9–10.3)
Chloride: 110 mmol/L (ref 98–111)
Creatinine, Ser: 2.01 mg/dL — ABNORMAL HIGH (ref 0.61–1.24)
GFR, Estimated: 32 mL/min — ABNORMAL LOW (ref >60)
Glucose, Bld: 143 mg/dL — ABNORMAL HIGH (ref 70–99)
Potassium: 4 mmol/L (ref 3.5–5.1)
Sodium: 141 mmol/L (ref 135–145)

## 2023-06-21 MED ORDER — LACTATED RINGERS IV BOLUS
1000.0000 mL | Freq: Once | INTRAVENOUS | Status: AC
  Administered 2023-06-21: 1000 mL via INTRAVENOUS

## 2023-06-21 NOTE — ED Provider Notes (Signed)
[facility] Provider Note    Event Date/Time   First MD Initiated Contact with Patient 06/21/23 1622     (approximate)   History   Chief Complaint: Dizziness (Hx AFIB, and dizziness)   HPI  Bobby Warner. is a 82 y.o. male with a history of diabetes, hypertension, GERD who reports being in his usual state of health today, did not drink fluids this morning because he needed to go for a routine lab draw, and then later on in the morning got locked out of his truck.  He had to walk 2 miles to a gas station and midday heat and then wait outdoors for someone to come to bring him an extra key.  During this time, he got lightheaded.  Denies any pain or shortness of breath.  When he returned to his truck he did have an episode of syncope, denies any preceding pain or other acute complaints.  In the ED he reports that he is feeling fine, asymptomatic.     Physical Exam   Triage Vital Signs: ED Triage Vitals  Encounter Vitals Group     BP 06/21/23 1623 106/60     Systolic BP Percentile --      Diastolic BP Percentile --      Pulse Rate 06/21/23 1623 91     Resp 06/21/23 1623 17     Temp 06/21/23 1623 98.1 F (36.7 C)     Temp Source 06/21/23 1623 Oral     SpO2 06/21/23 1619 94 %     Weight 06/21/23 1624 165 lb (74.8 kg)     Height 06/21/23 1624 5\' 10"  (1.778 m)     Head Circumference --      Peak Flow --      Pain Score 06/21/23 1624 0     Pain Loc --      Pain Education --      Exclude from Growth Chart --     Most recent vital signs: Vitals:   06/21/23 1924 06/21/23 1925  BP: (!) 140/73 136/76  Pulse: 78 86  Resp: 20 20  Temp:    SpO2: 100% 100%    General: Awake, no distress.  CV:  Good peripheral perfusion.  Normal distal pulses Resp:  Normal effort.  Clear to auscultation bilaterally Abd:  No distention.  Soft nontender Other:  Dry oral mucosa.  No lower extremity edema.  No signs of trauma   ED Results / Procedures / Treatments    Labs (all labs ordered are listed, but only abnormal results are displayed) Labs Reviewed  BASIC METABOLIC PANEL - Abnormal; Notable for the following components:      Result Value   CO2 20 (*)    Glucose, Bld 143 (*)    BUN 27 (*)    Creatinine, Ser 2.01 (*)    GFR, Estimated 32 (*)    All other components within normal limits  CBC - Abnormal; Notable for the following components:   RBC 3.16 (*)    Hemoglobin 11.0 (*)    HCT 33.1 (*)    MCV 104.7 (*)    MCH 34.8 (*)    All other components within normal limits  URINALYSIS, ROUTINE W REFLEX MICROSCOPIC - Abnormal; Notable for the following components:   Color, Urine YELLOW (*)    APPearance HAZY (*)    Protein, ur 30 (*)    Bacteria, UA RARE (*)    All other components within normal limits  CBG MONITORING, ED     EKG Interpreted by me Sinus rhythm rate of 86.  Normal axis intervals QRS ST segments and T waves.  1 PAC.   RADIOLOGY    PROCEDURES:  Procedures   MEDICATIONS ORDERED IN ED: Medications  lactated ringers bolus 1,000 mL (0 mLs Intravenous Stopped 06/21/23 1852)     IMPRESSION / MDM / ASSESSMENT AND PLAN / ED COURSE  I reviewed the triage vital signs and the nursing notes.  DDx: Dehydration, AKI, electrolyte abnormality, anemia, UTI, heat syncope .  Doubt ACS PE dissection stroke or infection  Patient's presentation is most consistent with acute presentation with potential threat to life or bodily function.  Patient presents with dizziness and syncope during an episode of prolonged heat exposure, possibly accentuated by low fluid intake this morning.  Vital signs are normal, no acute symptoms.  Exam is reassuring.  Labs at baseline.  Will check orthostatics after giving IV fluids.   Clinical Course as of 06/21/23 1945  Mon Jun 21, 2023  1943 Orthostatics normal. Stable for DC.  [PS]    Clinical Course User Index [PS] Sharman Cheek, MD     FINAL CLINICAL IMPRESSION(S) / ED DIAGNOSES    Final diagnoses:  Dehydration  Heat syncope, initial encounter     Rx / DC Orders   ED Discharge Orders     None        Note:  This document was prepared using Dragon voice recognition software and may include unintentional dictation errors.   Sharman Cheek, MD 06/21/23 1945

## 2023-06-21 NOTE — Discharge Instructions (Addendum)
Your labs were all reassuring today.  Continue drinking plenty of fluids to stay well-hydrated and follow-up with your doctor.

## 2023-06-21 NOTE — ED Triage Notes (Signed)
82 yo male transferred vai [facility] from [place], lives at home Walking c/o feeling dizzy, locked himself out of truck and was waiting awhile for family, fell beside his truck Denies LOC Family came to Pt when he was locked out and they felt Pt was unsteady EMS reports hyptensive, AFIB on their monitor, [facility] placed  #20 RAC w/fluid bolus Fell beside his truck BP 89/54, HR 100-115, SPO2 90-94%, BG: 168 reported Hx: AFIB CHF, no meds allergies, A&Ox4,  On arrival to hospital Pt incontinent of bowel and stool

## 2023-07-05 ENCOUNTER — Ambulatory Visit: Payer: Medicare Other | Attending: Student in an Organized Health Care Education/Training Program

## 2023-07-05 DIAGNOSIS — Z87891 Personal history of nicotine dependence: Secondary | ICD-10-CM | POA: Insufficient documentation

## 2023-07-05 DIAGNOSIS — R053 Chronic cough: Secondary | ICD-10-CM | POA: Diagnosis present

## 2023-07-05 DIAGNOSIS — R0609 Other forms of dyspnea: Secondary | ICD-10-CM | POA: Insufficient documentation

## 2023-07-05 MED ORDER — ALBUTEROL SULFATE (2.5 MG/3ML) 0.083% IN NEBU
2.5000 mg | INHALATION_SOLUTION | Freq: Once | RESPIRATORY_TRACT | Status: AC
  Administered 2023-07-05: 2.5 mg via RESPIRATORY_TRACT
  Filled 2023-07-05: qty 3

## 2023-07-16 ENCOUNTER — Ambulatory Visit (INDEPENDENT_AMBULATORY_CARE_PROVIDER_SITE_OTHER): Payer: Medicare Other | Admitting: Student in an Organized Health Care Education/Training Program

## 2023-07-16 ENCOUNTER — Encounter: Payer: Self-pay | Admitting: Student in an Organized Health Care Education/Training Program

## 2023-07-16 VITALS — BP 122/72 | HR 65 | Temp 97.3°F | Ht 70.0 in | Wt 173.4 lb

## 2023-07-16 DIAGNOSIS — R053 Chronic cough: Secondary | ICD-10-CM

## 2023-07-16 DIAGNOSIS — G4733 Obstructive sleep apnea (adult) (pediatric): Secondary | ICD-10-CM

## 2023-07-16 NOTE — Progress Notes (Unsigned)
Synopsis: Referred in *** by Lauro Regulus, MD  Assessment & Plan:   There are no diagnoses linked to this encounter.  Feels well overall, his cough is fully resolved.  He did not get admitted to the hospital after our last visit where he was found to have an AKI and electrolyte disturbances as well as diagnosed with new onset atrial fibrillation for which he was started on Eliquis.  Following all of this, his cough has mostly resolved.  He was drinking a lot of soda and Gatorade which he has decreased.  Workup has included a CT scan of the chest (performed in the hospital) that was only notable for some emphysema, double contrast esophagogram that suggests reflux disease, and a pulmonary function test that was overall normal (aside from mildly reduced DLCO.  Given he is minimally symptomatic, I will defer any further workup or treatment.  Patient will follow-up as needed.  Return if symptoms worsen or fail to improve.  I spent *** minutes caring for this patient today, including {EM billing:[id number]}  Raechel Chute, MD [facility] 07/16/2023 8:52 AM    End of visit medications:  No orders of the defined types were placed in this encounter.    Current Outpatient Medications:    acetaminophen (TYLENOL) 500 MG tablet, Take 1,000 mg by mouth every 6 (six) hours as needed., Disp: , Rfl:    apixaban (ELIQUIS) 2.5 MG TABS tablet, Take 1 tablet (2.5 mg total) by mouth 2 (two) times daily., Disp: 60 tablet, Rfl: 1   aspirin 81 MG tablet, Take 81 mg by mouth daily., Disp: , Rfl:    atorvastatin (LIPITOR) 40 MG tablet, Take 40 mg by mouth daily., Disp: , Rfl:    cetirizine (ZYRTEC) 10 MG chewable tablet, Chew 10 mg by mouth daily., Disp: , Rfl:    cholecalciferol (VITAMIN D3) 25 MCG (1000 UNIT) tablet, Take 1,000 Units by mouth daily., Disp: , Rfl:    colchicine 0.6 MG tablet, Take 0.6 mg by mouth 2 (two) times daily as needed., Disp: , Rfl:    glimepiride (AMARYL) 2 MG  tablet, TAKE 1 TABLET BY MOUTH ONCE DAILY WITH BREAKFAST, Disp: , Rfl:    losartan (COZAAR) 100 MG tablet, Take 100 mg by mouth daily., Disp: , Rfl:    Magnesium 400 MG TABS, Take 400 mg by mouth daily., Disp: 90 tablet, Rfl: 0   metFORMIN (GLUCOPHAGE-XR) 500 MG 24 hr tablet, Take 500 mg by mouth daily with breakfast., Disp: , Rfl:    metoprolol succinate (TOPROL-XL) 25 MG 24 hr tablet, Take 1 tablet (25 mg total) by mouth daily., Disp: 30 tablet, Rfl: 1   omeprazole (PRILOSEC) 20 MG capsule, Take 20 mg by mouth daily., Disp: , Rfl:    vitamin B-12 (CYANOCOBALAMIN) 1000 MCG tablet, Take 1,000 mcg by mouth daily., Disp: , Rfl:    Subjective:   PATIENT ID: Bobby Guest [name] GENDER: male DOB: Aug 09, 1941, MRN: [id number]  Chief Complaint  Patient presents with   Follow-up    Cough is a lot better. Occasionally coughs now.     HPI ***  Ancillary information including prior medications, full medical/surgical/family/social histories, and PFTs (when available) are listed below and have been reviewed.   ROS   Objective:   Vitals:   07/16/23 0841  BP: 122/72  Pulse: 65  Temp: (!) 97.3 F (36.3 C)  SpO2: 95%  Weight: 173 lb 6.4 oz (78.7 kg)  Height: 5\' 10"  (1.778 m)  95% on *** LPM *** RA BMI Readings from Last 3 Encounters:  07/16/23 24.88 kg/m  06/21/23 23.68 kg/m  05/21/23 24.97 kg/m   Wt Readings from Last 3 Encounters:  07/16/23 173 lb 6.4 oz (78.7 kg)  06/21/23 165 lb (74.8 kg)  05/21/23 174 lb (78.9 kg)    Physical Exam    Ancillary Information    Past Medical History:  Diagnosis Date   Arthritis    Back pain    Coronary artery disease    Diabetes mellitus without complication (HCC)    GERD (gastroesophageal reflux disease)    History of hiatal hernia    Hyperlipidemia    Hypertension    Sleep apnea      Family History  Problem Relation Age of Onset   Varicose Veins Neg Hx    Vision loss Neg Hx    Stroke Neg Hx      Past Surgical History:   Procedure Laterality Date   APPENDECTOMY     CARDIAC CATHETERIZATION     CHOLECYSTECTOMY     COLONOSCOPY WITH ESOPHAGOGASTRODUODENOSCOPY (EGD)     COLONOSCOPY WITH PROPOFOL N/A 03/16/2016   Procedure: COLONOSCOPY WITH PROPOFOL;  Surgeon: [name], MD;  Location: [facility] ENDOSCOPY;  Service: Endoscopy;  Laterality: N/A;   ESOPHAGOGASTRODUODENOSCOPY (EGD) WITH PROPOFOL N/A 02/28/2020   Procedure: ESOPHAGOGASTRODUODENOSCOPY (EGD) WITH PROPOFOL;  Surgeon: Toledo, Boykin Nearing, MD;  Location: [facility] ENDOSCOPY;  Service: Gastroenterology;  Laterality: N/A;   EYE SURGERY     HERNIA REPAIR     LOWER EXTREMITY ANGIOGRAPHY Left 10/09/2019   Procedure: LOWER EXTREMITY ANGIOGRAPHY;  Surgeon: Annice Needy, MD;  Location: [facility] INVASIVE CV LAB;  Service: Cardiovascular;  Laterality: Left;   LOWER EXTREMITY ANGIOGRAPHY Left 02/10/2021   Procedure: LOWER EXTREMITY ANGIOGRAPHY;  Surgeon: Annice Needy, MD;  Location: [facility] INVASIVE CV LAB;  Service: Cardiovascular;  Laterality: Left;   NISSEN FUNDOPLICATION     testicular vein surgery     TONSILLECTOMY     TOTAL KNEE ARTHROPLASTY      Social History   Socioeconomic History   Marital status: Married    Spouse name: Trenidad Grizzel   Number of children: 1   Years of education: Not on file   Highest education level: Not on file  Occupational History   Not on file  Tobacco Use   Smoking status: Former    Current packs/day: 0.00    Average packs/day: 2.0 packs/day for 20.0 years (40.0 ttl pk-yrs)    Types: Cigarettes    Start date: 21    Quit date: 1979    Years since quitting: 45.7   Smokeless tobacco: Never  Vaping Use   Vaping status: Never Used  Substance and Sexual Activity   Alcohol use: Yes    Alcohol/week: 1.0 standard drink of alcohol    Types: 1 Cans of beer per week    Comment: occassional    Drug use: No   Sexual activity: Not Currently  Other Topics Concern   Not on file  Social History Narrative   Not on file   Social  Determinants of Health   Financial Resource Strain: Low Risk  (06/28/2023)   Received from [facility]   Overall Financial Resource Strain (CARDIA)    Difficulty of Paying Living Expenses: Not hard at all  Food Insecurity: No Food Insecurity (06/28/2023)   [facility] from [facility]   Hunger Vital Sign    Worried About Running Out of Food  in the Last Year: Never true    Ran Out of Food in the Last Year: Never true  Transportation Needs: No Transportation Needs (06/28/2023)   Received from [facility] Transportation    In the past 12 months, has lack of transportation kept you from medical appointments or from getting medications?: No    Lack of Transportation (Non-Medical): No  Physical Activity: Not on file  Stress: Not on file  Social Connections: Not on file  Intimate Partner Violence: Not on file     Allergies  Allergen Reactions   Nsaids    Tolmetin      CBC    Component Value Date/Time   WBC 7.7 06/21/2023 1630   RBC 3.16 (L) 06/21/2023 1630   HGB 11.0 (L) 06/21/2023 1630   HGB 13.4 04/18/2013 0907   HCT 33.1 (L) 06/21/2023 1630   HCT 38.5 (L) 04/18/2013 0907   PLT 214 06/21/2023 1630   PLT 162 04/18/2013 0907   MCV 104.7 (H) 06/21/2023 1630   MCV 100 04/18/2013 0907   MCH 34.8 (H) 06/21/2023 1630   MCHC 33.2 06/21/2023 1630   RDW 13.8 06/21/2023 1630   RDW 14.2 04/18/2013 0907   LYMPHSABS 2.9 05/20/2023 1659   LYMPHSABS 0.5 (L) 06/20/2012 2346   MONOABS 0.6 05/20/2023 1659   MONOABS 0.5 06/20/2012 2346   EOSABS 0.0 05/20/2023 1659   EOSABS 0.0 06/20/2012 2346   BASOSABS 0.0 05/20/2023 1659   BASOSABS 0.1 06/20/2012 2346    Pulmonary Functions Testing Results:     No data to display          Outpatient Medications Prior to Visit  Medication Sig Dispense Refill   acetaminophen (TYLENOL) 500 MG tablet Take 1,000 mg by mouth every 6 (six) hours as needed.     apixaban (ELIQUIS) 2.5 MG  TABS tablet Take 1 tablet (2.5 mg total) by mouth 2 (two) times daily. 60 tablet 1   aspirin 81 MG tablet Take 81 mg by mouth daily.     atorvastatin (LIPITOR) 40 MG tablet Take 40 mg by mouth daily.     cetirizine (ZYRTEC) 10 MG chewable tablet Chew 10 mg by mouth daily.     cholecalciferol (VITAMIN D3) 25 MCG (1000 UNIT) tablet Take 1,000 Units by mouth daily.     colchicine 0.6 MG tablet Take 0.6 mg by mouth 2 (two) times daily as needed.     glimepiride (AMARYL) 2 MG tablet TAKE 1 TABLET BY MOUTH ONCE DAILY WITH BREAKFAST     losartan (COZAAR) 100 MG tablet Take 100 mg by mouth daily.     Magnesium 400 MG TABS Take 400 mg by mouth daily. 90 tablet 0   metFORMIN (GLUCOPHAGE-XR) 500 MG 24 hr tablet Take 500 mg by mouth daily with breakfast.     metoprolol succinate (TOPROL-XL) 25 MG 24 hr tablet Take 1 tablet (25 mg total) by mouth daily. 30 tablet 1   omeprazole (PRILOSEC) 20 MG capsule Take 20 mg by mouth daily.     vitamin B-12 (CYANOCOBALAMIN) 1000 MCG tablet Take 1,000 mcg by mouth daily.     No facility-administered medications prior to visit.

## 2023-08-20 ENCOUNTER — Telehealth: Payer: Self-pay | Admitting: Student in an Organized Health Care Education/Training Program

## 2023-08-20 DIAGNOSIS — R053 Chronic cough: Secondary | ICD-10-CM

## 2023-08-20 NOTE — Telephone Encounter (Signed)
Dr. Aundria Rud is out of the office today. He will return on Monday. I have sent him the message.   Lm x1 for the patient to notify him.

## 2023-08-20 NOTE — Telephone Encounter (Signed)
PT's wife states she get Claritin called in to the pharm and ity is cheaper. Wonders if Dr. Aundria Rud can do the same for him  Pharm: [facility] on [address]

## 2023-08-23 MED ORDER — LORATADINE 10 MG PO TABS: 10.0000 mg | ORAL_TABLET | Freq: Every day | ORAL | 11 refills | Status: AC

## 2023-08-23 NOTE — Telephone Encounter (Signed)
Patient advised that prescription has been sent to [facility] pharmacy. Nothing further needed. sd

## 2023-12-21 ENCOUNTER — Ambulatory Visit (INDEPENDENT_AMBULATORY_CARE_PROVIDER_SITE_OTHER): Payer: Medicare Other

## 2023-12-21 ENCOUNTER — Encounter (INDEPENDENT_AMBULATORY_CARE_PROVIDER_SITE_OTHER): Payer: Self-pay | Admitting: Vascular Surgery

## 2023-12-21 ENCOUNTER — Ambulatory Visit (INDEPENDENT_AMBULATORY_CARE_PROVIDER_SITE_OTHER): Payer: Medicare Other | Admitting: Vascular Surgery

## 2023-12-21 VITALS — BP 145/70 | HR 62 | Resp 18 | Ht 69.0 in | Wt 167.6 lb

## 2023-12-21 DIAGNOSIS — E1122 Type 2 diabetes mellitus with diabetic chronic kidney disease: Secondary | ICD-10-CM

## 2023-12-21 DIAGNOSIS — I739 Peripheral vascular disease, unspecified: Secondary | ICD-10-CM

## 2023-12-21 DIAGNOSIS — I10 Essential (primary) hypertension: Secondary | ICD-10-CM

## 2023-12-21 DIAGNOSIS — N183 Chronic kidney disease, stage 3 unspecified: Secondary | ICD-10-CM

## 2023-12-21 NOTE — Progress Notes (Signed)
MRN : [id number]  Bobby SULLENBERGER [name] is a 83 y.o. (09-17-1941) male who presents with chief complaint of  Chief Complaint  Patient presents with   Follow-up    f/u in 1 year with abi  .  History of Present Illness: Patient returns today in follow up of his PAD.  He reports having more pain in the left foot that he has interpreted as gout attacks recently.  These come and go and are not consistently present.  No open wounds.  No fevers or chills.  No lifestyle limiting claudication.  His noninvasive studies were performed today and he has a history of noncompressible falsely elevated ABIs.  His right ABI is 1.55 with biphasic waveforms and a digit pressure of 77.  His left ABI has dropped to 0.81 which again may be falsely elevated and he has a digit pressure of only 30.  Current Outpatient Medications  Medication Sig Dispense Refill   acetaminophen (TYLENOL) 500 MG tablet Take 1,000 mg by mouth every 6 (six) hours as needed.     apixaban (ELIQUIS) 2.5 MG TABS tablet Take 1 tablet (2.5 mg total) by mouth 2 (two) times daily. 60 tablet 1   aspirin 81 MG tablet Take 81 mg by mouth daily.     atorvastatin (LIPITOR) 40 MG tablet Take 40 mg by mouth daily.     cholecalciferol (VITAMIN D3) 25 MCG (1000 UNIT) tablet Take 1,000 Units by mouth daily.     colchicine 0.6 MG tablet Take 0.6 mg by mouth 2 (two) times daily as needed.     glimepiride (AMARYL) 2 MG tablet TAKE 1 TABLET BY MOUTH ONCE DAILY WITH BREAKFAST     loratadine (CLARITIN) 10 MG tablet Take 1 tablet (10 mg total) by mouth daily. 30 tablet 11   losartan (COZAAR) 100 MG tablet Take 100 mg by mouth daily.     Magnesium 400 MG TABS Take 400 mg by mouth daily. 90 tablet 0   metFORMIN (GLUCOPHAGE-XR) 500 MG 24 hr tablet Take 500 mg by mouth daily with breakfast.     metoprolol succinate (TOPROL-XL) 25 MG 24 hr tablet Take 1 tablet (25 mg total) by mouth daily. 30 tablet 1   omeprazole (PRILOSEC) 20 MG capsule Take 20 mg by mouth  daily.     pantoprazole (PROTONIX) 40 MG tablet Take 1 tablet by mouth daily.     vitamin B-12 (CYANOCOBALAMIN) 1000 MCG tablet Take 1,000 mcg by mouth daily.     No current facility-administered medications for this visit.    Past Medical History:  Diagnosis Date   Arthritis    Back pain    Coronary artery disease    Diabetes mellitus without complication (HCC)    GERD (gastroesophageal reflux disease)    History of hiatal hernia    Hyperlipidemia    Hypertension    Sleep apnea     Past Surgical History:  Procedure Laterality Date   APPENDECTOMY     CARDIAC CATHETERIZATION     CHOLECYSTECTOMY     COLONOSCOPY WITH ESOPHAGOGASTRODUODENOSCOPY (EGD)     COLONOSCOPY WITH PROPOFOL N/A 03/16/2016   Procedure: COLONOSCOPY WITH PROPOFOL;  Surgeon: [name], MD;  Location: [facility] ENDOSCOPY;  Service: Endoscopy;  Laterality: N/A;   ESOPHAGOGASTRODUODENOSCOPY (EGD) WITH PROPOFOL N/A 02/28/2020   Procedure: ESOPHAGOGASTRODUODENOSCOPY (EGD) WITH PROPOFOL;  Surgeon: Toledo, Boykin Nearing, MD;  Location: [facility] ENDOSCOPY;  Service: Gastroenterology;  Laterality: N/A;   EYE SURGERY     HERNIA REPAIR  LOWER EXTREMITY ANGIOGRAPHY Left 10/09/2019   Procedure: LOWER EXTREMITY ANGIOGRAPHY;  Surgeon: Annice Needy, MD;  Location: [facility] INVASIVE CV LAB;  Service: Cardiovascular;  Laterality: Left;   LOWER EXTREMITY ANGIOGRAPHY Left 02/10/2021   Procedure: LOWER EXTREMITY ANGIOGRAPHY;  Surgeon: Annice Needy, MD;  Location: [facility] INVASIVE CV LAB;  Service: Cardiovascular;  Laterality: Left;   NISSEN FUNDOPLICATION     testicular vein surgery     TONSILLECTOMY     TOTAL KNEE ARTHROPLASTY       Social History   Tobacco Use   Smoking status: Former    Current packs/day: 0.00    Average packs/day: 2.0 packs/day for 20.0 years (40.0 ttl pk-yrs)    Types: Cigarettes    Start date: 51    Quit date: 1979    Years since quitting: 46.1   Smokeless tobacco: Never  Vaping Use   Vaping status: Never  Used  Substance Use Topics   Alcohol use: Yes    Alcohol/week: 1.0 standard drink of alcohol    Types: 1 Cans of beer per week    Comment: occassional    Drug use: No      Family History  Problem Relation Age of Onset   Varicose Veins Neg Hx    Vision loss Neg Hx    Stroke Neg Hx      Allergies  Allergen Reactions   Nsaids    Tolmetin      REVIEW OF SYSTEMS (Negative unless checked)   Constitutional: [] Weight loss  [] Fever  [] Chills Cardiac: [] Chest pain   [] Chest pressure   [] Palpitations   [] Shortness of breath when laying flat   [] Shortness of breath at rest   [] Shortness of breath with exertion. Vascular:  [] Pain in legs with walking   [] Pain in legs at rest   [] Pain in legs when laying flat   [] Claudication   [] Pain in feet when walking  [x] Pain in feet at rest  [] Pain in feet when laying flat   [] History of DVT   [] Phlebitis   [] Swelling in legs   [] Varicose veins   [] Non-healing ulcers Pulmonary:   [] Uses home oxygen   [] Productive cough   [] Hemoptysis   [] Wheeze  [] COPD   [] Asthma Neurologic:  [] Dizziness  [] Blackouts   [] Seizures   [] History of stroke   [] History of TIA  [] Aphasia   [] Temporary blindness   [] Dysphagia   [] Weakness or numbness in arms   [] Weakness or numbness in legs Musculoskeletal:  [x] Arthritis   [] Joint swelling   [x] Joint pain   [x] Low back pain Hematologic:  [] Easy bruising  [] Easy bleeding   [] Hypercoagulable state   [] Anemic   Gastrointestinal:  [] Blood in stool   [] Vomiting blood  [x] Gastroesophageal reflux/heartburn   [] Abdominal pain Genitourinary:  [] Chronic kidney disease   [] Difficult urination  [] Frequent urination  [] Burning with urination   [] Hematuria Skin:  [] Rashes   [] Ulcers   [] Wounds Psychological:  [] History of anxiety   []  History of major depression.    Physical Examination  BP (!) 145/70   Pulse 62   Resp 18   Ht 5\' 9"  (1.753 m)   Wt 167 lb 9.6 oz (76 kg)   BMI 24.75 kg/m  Gen:  WD/WN, NAD. Appears younger than  stated age. Head: [place]/AT, No temporalis wasting. Ear/Nose/Throat: Hearing grossly intact, nares w/o erythema or drainage Eyes: Conjunctiva clear. Sclera non-icteric Neck: Supple.  Trachea midline Pulmonary:  Good air movement, no use of accessory muscles.  Cardiac: RRR, no  JVD Vascular:  Vessel Right Left  Radial Palpable Palpable                          PT 1+ Palpable Not Palpable  DP 2+ Palpable 1+ Palpable   Gastrointestinal: soft, non-tender/non-distended. No guarding/reflex.  Musculoskeletal: M/S 5/5 throughout.  No deformity or atrophy. No edema. Neurologic: Sensation grossly intact in extremities.  Symmetrical.  Speech is fluent.  Psychiatric: Judgment intact, Mood & affect appropriate for pt's clinical situation. Dermatologic: No rashes or ulcers noted.  No cellulitis or open wounds.      Labs No results found for this or any previous visit (from the past 2160 hours).  Radiology No results found.  Assessment/Plan  PVD (peripheral vascular disease) (HCC) His noninvasive studies were performed today and he has a history of noncompressible falsely elevated ABIs.  His right ABI is 1.55 with biphasic waveforms and a digit pressure of 77.  His left ABI has dropped to 0.81 which again may be falsely elevated and he has a digit pressure of only 30. I had a long discussion with the patient today.  I believe that these gout flares are very likely to be related to perfusion is much as gout.  I discussed the drop in his studies and why this is concerning.  I discussed he has already had 2 interventions on this left leg and there is evidence of recurrent disease on that leg.  I have discussed proceeding with intervention to further evaluate this versus a shortened interval follow-up.  He really does not think the symptoms are severe enough for him to desire intervention at this time and that is certainly reasonable.  At this point, I will shorten his interval follow-up but he will  contact our office if symptoms become more debilitating or bothersome before his next visit.  HTN (hypertension), benign blood pressure control important in reducing the progression of atherosclerotic disease. On appropriate oral medications.     Diabetes mellitus with stage 3 chronic kidney disease (HCC) blood glucose control important in reducing the progression of atherosclerotic disease. Also, involved in wound healing. On appropriate medications.     Renal failure, chronic, stage 3 (moderate) Limit contrast with procedures requiring contrast and hydrate well if contrast is used.  No need for invasive procedures at this time.  [name], MD  12/21/2023 9:21 AM    This note was created with Dragon medical transcription system.  Any errors from dictation are purely unintentional

## 2023-12-21 NOTE — Assessment & Plan Note (Signed)
His noninvasive studies were performed today and he has a history of noncompressible falsely elevated ABIs.  His right ABI is 1.55 with biphasic waveforms and a digit pressure of 77.  His left ABI has dropped to 0.81 which again may be falsely elevated and he has a digit pressure of only 30. I had a long discussion with the patient today.  I believe that these gout flares are very likely to be related to perfusion is much as gout.  I discussed the drop in his studies and why this is concerning.  I discussed he has already had 2 interventions on this left leg and there is evidence of recurrent disease on that leg.  I have discussed proceeding with intervention to further evaluate this versus a shortened interval follow-up.  He really does not think the symptoms are severe enough for him to desire intervention at this time and that is certainly reasonable.  At this point, I will shorten his interval follow-up but he will contact our office if symptoms become more debilitating or bothersome before his next visit.

## 2023-12-24 LAB — VAS US ABI WITH/WO TBI: Left ABI: 0.81

## 2024-05-25 ENCOUNTER — Ambulatory Visit: Admitting: Sleep Medicine

## 2024-06-01 ENCOUNTER — Ambulatory Visit (INDEPENDENT_AMBULATORY_CARE_PROVIDER_SITE_OTHER): Admitting: Sleep Medicine

## 2024-06-01 ENCOUNTER — Encounter: Payer: Self-pay | Admitting: Sleep Medicine

## 2024-06-01 VITALS — BP 120/80 | HR 71 | Temp 98.1°F | Ht 69.0 in | Wt 170.0 lb

## 2024-06-01 DIAGNOSIS — I10 Essential (primary) hypertension: Secondary | ICD-10-CM | POA: Diagnosis not present

## 2024-06-01 DIAGNOSIS — G4733 Obstructive sleep apnea (adult) (pediatric): Secondary | ICD-10-CM

## 2024-06-01 DIAGNOSIS — Z87891 Personal history of nicotine dependence: Secondary | ICD-10-CM | POA: Diagnosis not present

## 2024-06-01 NOTE — Progress Notes (Signed)
 Name:Kreston L Vassar [name] MRN: [id number] DOB: 1941/04/10   CHIEF COMPLAINT:  CPAP F/U   HISTORY OF PRESENT ILLNESS:  Mr. Schreifels is a 83 y.o. w/ a h/o OSA, hyperlipidemia, atrial fibrillation, HTN and GERD who presents for CPAP F/U visit. Reports wanting to restart CPAP therapy. States that he has not used CPAP therapy since last August due to significant mask discomfort and air leaks. Reports significant daytime sleepiness.   EPWORTH SLEEP SCORE 12    06/01/2024   11:00 AM  Results of the Epworth flowsheet  Sitting and reading 3  Watching TV 3  Sitting, inactive in a public place (e.g. a theatre or a meeting) 1  As a passenger in a car for an hour without a break 1  Lying down to rest in the afternoon when circumstances permit 3  Sitting and talking to someone 1  Sitting quietly after a lunch without alcohol 0  In a car, while stopped for a few minutes in traffic 0  Total score 12     PAST MEDICAL HISTORY :   has a past medical history of Arthritis, Back pain, Coronary artery disease, Diabetes mellitus without complication (HCC), GERD (gastroesophageal reflux disease), History of hiatal hernia, Hyperlipidemia, Hypertension, and Sleep apnea.  has a past surgical history that includes Eye surgery; Total knee arthroplasty; Tonsillectomy; Appendectomy; Cardiac catheterization; Hernia repair; testicular vein surgery; Cholecystectomy; Nissen fundoplication; Colonoscopy with esophagogastroduodenoscopy (egd); Colonoscopy with propofol  (N/A, 03/16/2016); Lower Extremity Angiography (Left, 10/09/2019); Esophagogastroduodenoscopy (egd) with propofol  (N/A, 02/28/2020); and Lower Extremity Angiography (Left, 02/10/2021). Prior to Admission medications   Medication Sig Start Date End Date Taking? Authorizing Provider  ACCU-CHEK GUIDE TEST test strip 1 each as needed.   Yes [provider]  acetaminophen  (TYLENOL ) 500 MG tablet Take 1,000 mg by mouth every 6 (six) hours as needed.    Yes [provider]  apixaban  (ELIQUIS ) 2.5 MG TABS tablet Take 1 tablet (2.5 mg total) by mouth 2 (two) times daily. 05/23/23  Yes Caleen Qualia, MD  aspirin  81 MG tablet Take 81 mg by mouth daily.   Yes [provider]  atorvastatin  (LIPITOR) 40 MG tablet Take 40 mg by mouth daily. 07/03/19  Yes [provider]  cholecalciferol (VITAMIN D3) 25 MCG (1000 UNIT) tablet Take 1,000 Units by mouth daily.   Yes [provider]  colchicine  0.6 MG tablet Take 0.6 mg by mouth 2 (two) times daily as needed. 05/30/19  Yes [provider]  glimepiride (AMARYL) 1 MG tablet Take 1 mg by mouth every morning.   Yes [provider]  loratadine  (CLARITIN ) 10 MG tablet Take 1 tablet (10 mg total) by mouth daily. 08/23/23 08/22/24 Yes Dgayli, Belva, MD  losartan (COZAAR) 100 MG tablet Take 100 mg by mouth daily. 06/18/19  Yes [provider]  Magnesium  400 MG TABS Take 400 mg by mouth daily. 05/23/23  Yes Amin, Sumayya, MD  metFORMIN (GLUCOPHAGE-XR) 500 MG 24 hr tablet Take 500 mg by mouth daily with breakfast.   Yes [provider]  metoprolol  succinate (TOPROL -XL) 25 MG 24 hr tablet Take 1 tablet (25 mg total) by mouth daily. 05/24/23  Yes Amin, Sumayya, MD  omeprazole (PRILOSEC) 20 MG capsule Take 20 mg by mouth daily.   Yes [provider]  pantoprazole  (PROTONIX ) 40 MG tablet Take 1 tablet by mouth daily. 09/28/23  Yes [provider]  predniSONE (DELTASONE) 20 MG tablet Take 20 mg by mouth daily. 04/21/24  Yes [provider]  vitamin B-12 (CYANOCOBALAMIN) 1000 MCG tablet Take 1,000 mcg by mouth daily.   Yes [provider]   Allergies  Allergen Reactions   Pollen Extract Cough   Nsaids    Tolmetin     FAMILY HISTORY:  family history is not on file. SOCIAL HISTORY:  reports that he quit smoking about 46 years ago. His smoking use included cigarettes. He started smoking about 66 years ago. He has a 40  pack-year smoking history. He has never used smokeless tobacco. He reports current alcohol use of about 1.0 standard drink of alcohol per week. He reports that he does not use drugs.   Review of Systems:  Gen:  Denies  fever, sweats, chills weight loss  HEENT: Denies blurred vision, double vision, ear pain, eye pain, hearing loss, nose bleeds, sore throat Cardiac:  No dizziness, chest pain or heaviness, chest tightness,edema, No JVD Resp:   No cough, -sputum production, -shortness of breath,-wheezing, -hemoptysis,  Gi: Denies swallowing difficulty, stomach pain, nausea or vomiting, diarrhea, constipation, bowel incontinence Gu:  Denies bladder incontinence, burning urine Ext:   Denies Joint pain, stiffness or swelling Skin: Denies  skin rash, easy bruising or bleeding or hives Endoc:  Denies polyuria, polydipsia , polyphagia or weight change Psych:   Denies depression, insomnia or hallucinations  Other:  All other systems negative  VITAL SIGNS: BP 120/80 (BP Location: Right Arm, Patient Position: Sitting, Cuff Size: Normal)   Pulse 71   Temp 98.1 F (36.7 C) (Oral)   Ht 5' 9 (1.753 m)   Wt 170 lb (77.1 kg)   SpO2 99%   BMI 25.10 kg/m    Physical Examination:   General Appearance: No distress  EYES PERRLA, EOM intact.   NECK Supple, No JVD Pulmonary: normal breath sounds, No wheezing.  CardiovascularNormal S1,S2.  No m/r/g.   Abdomen: Benign, Soft, non-tender. Skin:   warm, no rashes, no ecchymosis  Extremities: normal, no cyanosis, clubbing. Neuro:without focal findings,  speech normal  PSYCHIATRIC: Mood, affect within normal limits.   ASSESSMENT AND PLAN  OSA Restarting patient on CPAP therapy. To improve comfort, will try patient on the Airfit F40 FFM. Discussed the consequences of untreated sleep apnea. Advised not to drive drowsy for safety of patient and others. Will follow up in 3 months.    HTN Stable, on current management. Following with PCP.    Patient   satisfied with Plan of action and management. All questions answered  I spent a total of 46 minutes reviewing chart data, face-to-face evaluation with the patient, counseling and coordination of care as detailed above.    Jaheim Canino, M.D.  Sleep Medicine [facility]

## 2024-06-01 NOTE — Patient Instructions (Signed)

## 2024-06-05 ENCOUNTER — Telehealth: Payer: Self-pay

## 2024-06-05 NOTE — Telephone Encounter (Signed)
 Spoke with Pts wife and they will try to go to [employer] for trouble shooting of possible air leakage from around the mask.

## 2024-06-05 NOTE — Telephone Encounter (Signed)
 Copied from CRM #[id number]. Topic: Clinical - Medical Advice >> Jun 05, 2024 10:26 AM Bobby Warner wrote: Reason for CRM: Spouse states face mask for CPAP machine wont stop leaking - would like to come in to get it fixed or have someone call her back.   Callback number: [phone]

## 2024-06-06 NOTE — Procedures (Signed)
 Bobby Warner

## 2024-06-20 ENCOUNTER — Encounter (INDEPENDENT_AMBULATORY_CARE_PROVIDER_SITE_OTHER): Payer: Self-pay | Admitting: Vascular Surgery

## 2024-06-20 ENCOUNTER — Other Ambulatory Visit (INDEPENDENT_AMBULATORY_CARE_PROVIDER_SITE_OTHER): Payer: Medicare Other

## 2024-06-20 ENCOUNTER — Ambulatory Visit (INDEPENDENT_AMBULATORY_CARE_PROVIDER_SITE_OTHER): Payer: Medicare Other | Admitting: Vascular Surgery

## 2024-06-20 VITALS — BP 164/73 | HR 63 | Ht 69.0 in | Wt 171.6 lb

## 2024-06-20 DIAGNOSIS — E1122 Type 2 diabetes mellitus with diabetic chronic kidney disease: Secondary | ICD-10-CM

## 2024-06-20 DIAGNOSIS — I739 Peripheral vascular disease, unspecified: Secondary | ICD-10-CM | POA: Diagnosis not present

## 2024-06-20 DIAGNOSIS — N183 Chronic kidney disease, stage 3 unspecified: Secondary | ICD-10-CM | POA: Diagnosis not present

## 2024-06-20 DIAGNOSIS — I10 Essential (primary) hypertension: Secondary | ICD-10-CM

## 2024-06-20 LAB — VAS US ABI WITH/WO TBI: Left ABI: 1.24

## 2024-06-20 NOTE — Assessment & Plan Note (Signed)
 His ABIs are noncompressible on the right and 1.24 on the left which could be falsely elevated, but his waveforms are biphasic bilaterally and his digital pressures are in the 90s bilaterally which is an improvement from his previous studies. No symptoms. Check annually.  No change in medications.

## 2024-06-20 NOTE — Progress Notes (Signed)
 MRN : [id number]  Bobby HORNBACK [name] is a 83 y.o. ([birth year]) male who presents with chief complaint of  Chief Complaint  Patient presents with   ABI    F/u 6 months  ABI     .  History of Present Illness: Patient returns today in follow up of his PAD.  He has had 2 previous left lower extremity interventions the most recent being about 3 years ago.  He is doing well today.  Says his legs are doing fine without any significant claudication, rest pain, or ulceration.  His ABIs are noncompressible on the right and 1.24 on the left which could be falsely elevated, but his waveforms are biphasic bilaterally and his digital pressures are in the 90s bilaterally which is an improvement from his previous studies.  Current Outpatient Medications  Medication Sig Dispense Refill   ACCU-CHEK GUIDE TEST test strip 1 each as needed.     acetaminophen  (TYLENOL ) 500 MG tablet Take 1,000 mg by mouth every 6 (six) hours as needed.     apixaban  (ELIQUIS ) 2.5 MG TABS tablet Take 1 tablet (2.5 mg total) by mouth 2 (two) times daily. 60 tablet 1   aspirin  81 MG tablet Take 81 mg by mouth daily.     atorvastatin  (LIPITOR) 40 MG tablet Take 40 mg by mouth daily.     colchicine  0.6 MG tablet Take 0.6 mg by mouth 2 (two) times daily as needed.     glimepiride (AMARYL) 1 MG tablet Take 1 mg by mouth every morning.     loratadine  (CLARITIN ) 10 MG tablet Take 1 tablet (10 mg total) by mouth daily. 30 tablet 11   losartan (COZAAR) 100 MG tablet Take 100 mg by mouth daily.     Magnesium  400 MG TABS Take 400 mg by mouth daily. 90 tablet 0   metFORMIN (GLUCOPHAGE-XR) 500 MG 24 hr tablet Take 500 mg by mouth daily with breakfast.     metoprolol  succinate (TOPROL -XL) 25 MG 24 hr tablet Take 1 tablet (25 mg total) by mouth daily. 30 tablet 1   omeprazole (PRILOSEC) 20 MG capsule Take 20 mg by mouth daily.     vitamin B-12 (CYANOCOBALAMIN) 1000 MCG tablet Take 1,000 mcg by mouth daily.     cholecalciferol (VITAMIN  D3) 25 MCG (1000 UNIT) tablet Take 1,000 Units by mouth daily. (Patient not taking: Reported on 06/20/2024)     pantoprazole  (PROTONIX ) 40 MG tablet Take 1 tablet by mouth daily.     predniSONE (DELTASONE) 20 MG tablet Take 20 mg by mouth daily. (Patient not taking: Reported on 06/20/2024)     No current facility-administered medications for this visit.    Past Medical History:  Diagnosis Date   Arthritis    Back pain    Coronary artery disease    Diabetes mellitus without complication (HCC)    GERD (gastroesophageal reflux disease)    History of hiatal hernia    Hyperlipidemia    Hypertension    Sleep apnea     Past Surgical History:  Procedure Laterality Date   APPENDECTOMY     CARDIAC CATHETERIZATION     CHOLECYSTECTOMY     COLONOSCOPY WITH ESOPHAGOGASTRODUODENOSCOPY (EGD)     COLONOSCOPY WITH PROPOFOL  N/A 03/16/2016   Procedure: COLONOSCOPY WITH PROPOFOL ;  Surgeon: Gladis RAYMOND Mariner, MD;  Location[facility] ENDOSCOPY;  Service: Endoscopy;  Laterality: N/A;   ESOPHAGOGASTRODUODENOSCOPY (EGD) WITH PROPOFOL  N/A 02/28/2020   Procedure: ESOPHAGOGASTRODUODENOSCOPY (EGD) WITH PROPOFOL ;  Surgeon: Sardis, Teodoro  K, MD;  Location: [facility] ENDOSCOPY;  Service: Gastroenterology;  Laterality: N/A;   EYE SURGERY     HERNIA REPAIR     LOWER EXTREMITY ANGIOGRAPHY Left 10/09/2019   Procedure: LOWER EXTREMITY ANGIOGRAPHY;  Surgeon: Marea Selinda RAMAN, MD;  Location: [facility] INVASIVE CV LAB;  Service: Cardiovascular;  Laterality: Left;   LOWER EXTREMITY ANGIOGRAPHY Left 02/10/2021   Procedure: LOWER EXTREMITY ANGIOGRAPHY;  Surgeon: Marea Selinda RAMAN, MD;  Location: [facility] INVASIVE CV LAB;  Service: Cardiovascular;  Laterality: Left;   NISSEN FUNDOPLICATION     testicular vein surgery     TONSILLECTOMY     TOTAL KNEE ARTHROPLASTY       Social History   Tobacco Use   Smoking status: Former    Current packs/day: 0.00    Average packs/day: 2.0 packs/day for 20.0 years (40.0 ttl pk-yrs)    Types: Cigarettes     Start date: 6    Quit date: 1979    Years since quitting: 46.6   Smokeless tobacco: Never  Vaping Use   Vaping status: Never Used  Substance Use Topics   Alcohol use: Yes    Alcohol/week: 1.0 standard drink of alcohol    Types: 1 Cans of beer per week    Comment: occassional    Drug use: No       Family History  Problem Relation Age of Onset   Varicose Veins Neg Hx    Vision loss Neg Hx    Stroke Neg Hx      Allergies  Allergen Reactions   Pollen Extract Cough   Nsaids    Tolmetin      REVIEW OF SYSTEMS (Negative unless checked)   Constitutional: [] Weight loss  [] Fever  [] Chills Cardiac: [] Chest pain   [] Chest pressure   [] Palpitations   [] Shortness of breath when laying flat   [] Shortness of breath at rest   [] Shortness of breath with exertion. Vascular:  [] Pain in legs with walking   [] Pain in legs at rest   [] Pain in legs when laying flat   [] Claudication   [] Pain in feet when walking  [x] Pain in feet at rest  [] Pain in feet when laying flat   [] History of DVT   [] Phlebitis   [] Swelling in legs   [] Varicose veins   [] Non-healing ulcers Pulmonary:   [] Uses home oxygen   [] Productive cough   [] Hemoptysis   [] Wheeze  [] COPD   [] Asthma Neurologic:  [] Dizziness  [] Blackouts   [] Seizures   [] History of stroke   [] History of TIA  [] Aphasia   [] Temporary blindness   [] Dysphagia   [] Weakness or numbness in arms   [] Weakness or numbness in legs Musculoskeletal:  [x] Arthritis   [] Joint swelling   [x] Joint pain   [x] Low back pain Hematologic:  [] Easy bruising  [] Easy bleeding   [] Hypercoagulable state   [] Anemic   Gastrointestinal:  [] Blood in stool   [] Vomiting blood  [x] Gastroesophageal reflux/heartburn   [] Abdominal pain Genitourinary:  [] Chronic kidney disease   [] Difficult urination  [] Frequent urination  [] Burning with urination   [] Hematuria Skin:  [] Rashes   [] Ulcers   [] Wounds Psychological:  [] History of anxiety   []  History of major depression.  Physical  Examination  BP (!) 164/73   Pulse 63   Ht 5' 9 (1.753 m)   Wt 171 lb 9.6 oz (77.8 kg)   BMI 25.34 kg/m  Gen:  WD/WN, NAD Head: [place]/AT, No temporalis wasting. Ear/Nose/Throat: Hearing grossly intact, nares w/o erythema or drainage Eyes: Conjunctiva clear.  Sclera non-icteric Neck: Supple.  Trachea midline Pulmonary:  Good air movement, no use of accessory muscles.  Cardiac: RRR, no JVD Vascular:  Vessel Right Left  Radial Palpable Palpable                          PT Palpable Palpable  DP Palpable Palpable   Gastrointestinal: soft, non-tender/non-distended. No guarding/reflex.  Musculoskeletal: M/S 5/5 throughout.  No deformity or atrophy. Trace LE edema. Neurologic: Sensation grossly intact in extremities.  Symmetrical.  Speech is fluent.  Psychiatric: Judgment intact, Mood & affect appropriate for pt's clinical situation. Dermatologic: No rashes or ulcers noted.  No cellulitis or open wounds.      Labs No results found for this or any previous visit (from the past 2160 hours).  Radiology No results found.  Assessment/Plan  PVD (peripheral vascular disease) (HCC) His ABIs are noncompressible on the right and 1.24 on the left which could be falsely elevated, but his waveforms are biphasic bilaterally and his digital pressures are in the 90s bilaterally which is an improvement from his previous studies. No symptoms. Check annually.  No change in medications.   HTN (hypertension), benign blood pressure control important in reducing the progression of atherosclerotic disease. On appropriate oral medications.     Diabetes mellitus with stage 3 chronic kidney disease (HCC) blood glucose control important in reducing the progression of atherosclerotic disease. Also, involved in wound healing. On appropriate medications.     Renal failure, chronic, stage 3 (moderate) Limit contrast with procedures requiring contrast and hydrate well if contrast is used.    Selinda Gu, MD  06/20/2024 9:20 AM    This note was created with Dragon medical transcription system.  Any errors from dictation are purely unintentional

## 2024-08-30 ENCOUNTER — Ambulatory Visit: Admitting: Sleep Medicine

## 2024-08-30 ENCOUNTER — Encounter: Payer: Self-pay | Admitting: Sleep Medicine

## 2024-08-30 VITALS — BP 120/80 | HR 64 | Temp 98.0°F | Ht 69.0 in | Wt 167.4 lb

## 2024-08-30 DIAGNOSIS — G4733 Obstructive sleep apnea (adult) (pediatric): Secondary | ICD-10-CM

## 2024-08-30 DIAGNOSIS — I10 Essential (primary) hypertension: Secondary | ICD-10-CM | POA: Diagnosis not present

## 2024-08-30 NOTE — Progress Notes (Signed)
 Name:Bobby L Warren [name] MRN: [id number] DOB: 1941-05-07   CHIEF COMPLAINT:  CPAP F/U   HISTORY OF PRESENT ILLNESS:  Bobby Warner is a 83 y.o. w/ a h/o OSA, hyperlipidemia, atrial fibrillation, HTN and GERD who presents for CPAP F/U visit. Reports wanting to restart CPAP therapy. States that he has not used CPAP therapy since August due to significant mask discomfort and air leaks. Reports significant daytime sleepiness.   EPWORTH SLEEP SCORE 12    06/01/2024   11:00 AM  Results of the Epworth flowsheet  Sitting and reading 3  Watching TV 3  Sitting, inactive in a public place (e.g. a theatre or a meeting) 1  As a passenger in a car for an hour without a break 1  Lying down to rest in the afternoon when circumstances permit 3  Sitting and talking to someone 1  Sitting quietly after a lunch without alcohol 0  In a car, while stopped for a few minutes in traffic 0  Total score 12     PAST MEDICAL HISTORY :   has a past medical history of Arthritis, Back pain, Coronary artery disease, Diabetes mellitus without complication (HCC), GERD (gastroesophageal reflux disease), History of hiatal hernia, Hyperlipidemia, Hypertension, and Sleep apnea.  has a past surgical history that includes Eye surgery; Total knee arthroplasty; Tonsillectomy; Appendectomy; Cardiac catheterization; Hernia repair; testicular vein surgery; Cholecystectomy; Nissen fundoplication; Colonoscopy with esophagogastroduodenoscopy (egd); Colonoscopy with propofol  (N/A, 03/16/2016); Lower Extremity Angiography (Left, 10/09/2019); Esophagogastroduodenoscopy (egd) with propofol  (N/A, 02/28/2020); and Lower Extremity Angiography (Left, 02/10/2021). Prior to Admission medications   Medication Sig Start Date End Date Taking? Authorizing Provider  ACCU-CHEK GUIDE TEST test strip 1 each as needed.   Yes [provider]  acetaminophen  (TYLENOL ) 500 MG tablet Take 1,000 mg by mouth every 6 (six) hours as needed.   Yes  [provider]  apixaban  (ELIQUIS ) 2.5 MG TABS tablet Take 1 tablet (2.5 mg total) by mouth 2 (two) times daily. 05/23/23  Yes Caleen Qualia, MD  aspirin  81 MG tablet Take 81 mg by mouth daily.   Yes [provider]  atorvastatin  (LIPITOR) 40 MG tablet Take 40 mg by mouth daily. 07/03/19  Yes [provider]  cholecalciferol (VITAMIN D3) 25 MCG (1000 UNIT) tablet Take 1,000 Units by mouth daily.   Yes [provider]  colchicine  0.6 MG tablet Take 0.6 mg by mouth 2 (two) times daily as needed. 05/30/19  Yes [provider]  glimepiride (AMARYL) 1 MG tablet Take 1 mg by mouth every morning.   Yes [provider]  loratadine  (CLARITIN ) 10 MG tablet Take 1 tablet (10 mg total) by mouth daily. 08/23/23 08/22/24 Yes Dgayli, Belva, MD  losartan (COZAAR) 100 MG tablet Take 100 mg by mouth daily. 06/18/19  Yes [provider]  Magnesium  400 MG TABS Take 400 mg by mouth daily. 05/23/23  Yes Amin, Sumayya, MD  metFORMIN (GLUCOPHAGE-XR) 500 MG 24 hr tablet Take 500 mg by mouth daily with breakfast.   Yes [provider]  metoprolol  succinate (TOPROL -XL) 25 MG 24 hr tablet Take 1 tablet (25 mg total) by mouth daily. 05/24/23  Yes Amin, Sumayya, MD  omeprazole (PRILOSEC) 20 MG capsule Take 20 mg by mouth daily.   Yes [provider]  pantoprazole  (PROTONIX ) 40 MG tablet Take 1 tablet by mouth daily. 09/28/23  Yes [provider]  predniSONE (DELTASONE) 20 MG tablet Take 20 mg by mouth daily. 04/21/24  Yes  [provider]  vitamin B-12 (CYANOCOBALAMIN) 1000 MCG tablet Take 1,000 mcg by mouth daily.   Yes [provider]   Allergies  Allergen Reactions   Pollen Extract Cough   Nsaids    Tolmetin     FAMILY HISTORY:  family history is not on file. SOCIAL HISTORY:  reports that he quit smoking about 46 years ago. His smoking use included cigarettes. He started smoking about 66 years ago. He has a 40  pack-year smoking history. He has never used smokeless tobacco. He reports current alcohol use of about 1.0 standard drink of alcohol per week. He reports that he does not use drugs.   Review of Systems:  Gen:  Denies  fever, sweats, chills weight loss  HEENT: Denies blurred vision, double vision, ear pain, eye pain, hearing loss, nose bleeds, sore throat Cardiac:  No dizziness, chest pain or heaviness, chest tightness,edema, No JVD Resp:   No cough, -sputum production, -shortness of breath,-wheezing, -hemoptysis,  Gi: Denies swallowing difficulty, stomach pain, nausea or vomiting, diarrhea, constipation, bowel incontinence Gu:  Denies bladder incontinence, burning urine Ext:   Denies Joint pain, stiffness or swelling Skin: Denies  skin rash, easy bruising or bleeding or hives Endoc:  Denies polyuria, polydipsia , polyphagia or weight change Psych:   Denies depression, insomnia or hallucinations  Other:  All other systems negative  VITAL SIGNS: BP 120/80   Pulse 64   Temp 98 F (36.7 C)   Ht 5' 9 (1.753 m)   Wt 167 lb 6.4 oz (75.9 kg)   SpO2 100%   BMI 24.72 kg/m     Physical Examination:   General Appearance: No distress  EYES PERRLA, EOM intact.   NECK Supple, No JVD Pulmonary: normal breath sounds, No wheezing.  CardiovascularNormal S1,S2.  No m/r/g.   Abdomen: Benign, Soft, non-tender. Skin:   warm, no rashes, no ecchymosis  Extremities: normal, no cyanosis, clubbing. Neuro:without focal findings,  speech normal  PSYCHIATRIC: Mood, affect within normal limits.   ASSESSMENT AND PLAN  OSA Restarting patient on CPAP therapy. To improve comfort, will try patient on the Airfit X30i FFM. Discussed the consequences of untreated sleep apnea. Advised not to drive drowsy for safety of patient and others. Will follow up in 3 months.    HTN Stable, on current management. Following with PCP.    Patient  satisfied with Plan of action and management. All questions  answered  I spent a total of 30 minutes reviewing chart data, face-to-face evaluation with the patient, counseling and coordination of care as detailed above.    Nikyah Lackman, M.D.  Sleep Medicine [facility]

## 2024-08-30 NOTE — Patient Instructions (Signed)

## 2024-09-01 ENCOUNTER — Ambulatory Visit: Admitting: Sleep Medicine

## 2024-11-16 ENCOUNTER — Telehealth (INDEPENDENT_AMBULATORY_CARE_PROVIDER_SITE_OTHER): Payer: Self-pay

## 2024-11-16 NOTE — Telephone Encounter (Signed)
 Patient called into nurse line with complaint of feet pain and wanting an appointment. Call back to patient to inquire he reports pain in feet is mainly from callous but there is some discomfort in the legs as well. He reports it keeps him up at night quite often with pain of 8/10 but at rest is pain of 5/10. He denies any redness or warmth in the areas of pain, states he and wife were concerned and wanted to be sure all is ok. Advised him for the calloused area he should reach out to podiatry so they may assist with that and I would forward to NP to see if he should come in sooner than scheduled of 06/2025, please advise

## 2024-11-16 NOTE — Telephone Encounter (Signed)
 We can get him in sooner but with ABIs and Art duplex to see JD/FB

## 2024-11-20 ENCOUNTER — Ambulatory Visit: Payer: PRIVATE HEALTH INSURANCE | Admitting: Student in an Organized Health Care Education/Training Program

## 2024-11-20 ENCOUNTER — Other Ambulatory Visit (INDEPENDENT_AMBULATORY_CARE_PROVIDER_SITE_OTHER): Payer: Self-pay | Admitting: Nurse Practitioner

## 2024-11-20 DIAGNOSIS — M79604 Pain in right leg: Secondary | ICD-10-CM

## 2024-11-21 ENCOUNTER — Ambulatory Visit: Payer: PRIVATE HEALTH INSURANCE | Admitting: Student in an Organized Health Care Education/Training Program

## 2024-11-22 ENCOUNTER — Ambulatory Visit (INDEPENDENT_AMBULATORY_CARE_PROVIDER_SITE_OTHER)

## 2024-11-22 ENCOUNTER — Encounter: Payer: Self-pay | Admitting: Student in an Organized Health Care Education/Training Program

## 2024-11-22 ENCOUNTER — Ambulatory Visit: Admitting: Student in an Organized Health Care Education/Training Program

## 2024-11-22 VITALS — BP 124/78 | HR 75 | Temp 98.0°F | Ht 69.0 in | Wt 164.4 lb

## 2024-11-22 DIAGNOSIS — Z87891 Personal history of nicotine dependence: Secondary | ICD-10-CM | POA: Diagnosis not present

## 2024-11-22 DIAGNOSIS — M79605 Pain in left leg: Secondary | ICD-10-CM

## 2024-11-22 DIAGNOSIS — G4733 Obstructive sleep apnea (adult) (pediatric): Secondary | ICD-10-CM | POA: Diagnosis not present

## 2024-11-22 DIAGNOSIS — R053 Chronic cough: Secondary | ICD-10-CM

## 2024-11-22 DIAGNOSIS — M79604 Pain in right leg: Secondary | ICD-10-CM

## 2024-11-22 NOTE — Patient Instructions (Signed)
" °  VISIT SUMMARY: During your visit, we discussed your chronic cough and obstructive sleep apnea. Your cough has returned mildly, likely due to a cold, and you have been experiencing discomfort with your CPAP mask.  YOUR PLAN: -CHRONIC COUGH: A chronic cough is a cough that persists over a long period of time. Your cough has returned mildly, likely due to a cold, and is mainly for throat clearing. There is no fever or wheezing, and your lungs are clear. No significant intervention is needed at this time, but you should continue taking loratadine  for allergy management.  -OBSTRUCTIVE SLEEP APNEA: Obstructive sleep apnea is a condition where your breathing stops and starts during sleep due to blocked airways. You have been experiencing soreness on your nose from the CPAP mask, leading you to stop using it. It is important to address the mask discomfort so you can continue using the CPAP machine for better sleep quality and overall health. Consider trying a different mask or consulting with your sleep specialist for further assistance.  INSTRUCTIONS: Please continue taking loratadine  for your allergies. For your obstructive sleep apnea, consider trying a different CPAP mask or consult with your sleep specialist to address the discomfort. Follow up with your doctor if your symptoms worsen or if you have any concerns.                      Contains text generated by [employer]bridge.                                 Contains text generated by Abridge.   "
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
## 2024-11-23 LAB — VAS US ABI WITH/WO TBI
Left ABI: 1.21
Right ABI: 1.19

## 2024-11-24 ENCOUNTER — Ambulatory Visit (INDEPENDENT_AMBULATORY_CARE_PROVIDER_SITE_OTHER): Admitting: Vascular Surgery

## 2024-11-24 ENCOUNTER — Encounter (INDEPENDENT_AMBULATORY_CARE_PROVIDER_SITE_OTHER): Payer: Self-pay | Admitting: Vascular Surgery

## 2024-11-24 VITALS — BP 135/79 | HR 75 | Resp 18 | Wt 164.2 lb

## 2024-11-24 DIAGNOSIS — I739 Peripheral vascular disease, unspecified: Secondary | ICD-10-CM | POA: Diagnosis not present

## 2024-11-24 DIAGNOSIS — E785 Hyperlipidemia, unspecified: Secondary | ICD-10-CM

## 2024-11-24 DIAGNOSIS — N183 Chronic kidney disease, stage 3 unspecified: Secondary | ICD-10-CM

## 2024-11-24 DIAGNOSIS — I10 Essential (primary) hypertension: Secondary | ICD-10-CM

## 2024-11-24 DIAGNOSIS — E1122 Type 2 diabetes mellitus with diabetic chronic kidney disease: Secondary | ICD-10-CM

## 2024-11-24 NOTE — Progress Notes (Signed)
 "   MRN : [id number]  Bobby BANCROFT [name] is a 84 y.o. (21-Jul-1941) male who presents with chief complaint of  Chief Complaint  Patient presents with   Follow-up    Abi and arterial duplex results  .  History of Present Illness:   Discussed the use of AI scribe software for clinical note transcription with the patient, who gave verbal consent to proceed.  History of Present Illness Bobby Mcelroy. is an 84 year old male with peripheral vascular disease who presents for follow-up of persistent foot pain associated with a callus.  He has persistent localized pain at the callus on his foot that requires daily analgesics. He denies new ulcers, wounds, or back pain.  He sees podiatry every three months for callus debridement. He has not used padding and continues to have pain at the callus site.  Recent vascular studies from the past few days showed normal perfusion in both feet.    Results Diagnostic   Current Outpatient Medications  Medication Sig Dispense Refill   ACCU-CHEK GUIDE TEST test strip 1 each as needed.     acetaminophen  (TYLENOL ) 500 MG tablet Take 1,000 mg by mouth every 6 (six) hours as needed.     apixaban  (ELIQUIS ) 2.5 MG TABS tablet Take 1 tablet (2.5 mg total) by mouth 2 (two) times daily. 60 tablet 1   aspirin  81 MG tablet Take 81 mg by mouth daily.     atorvastatin  (LIPITOR) 40 MG tablet Take 40 mg by mouth daily.     cholecalciferol (VITAMIN D3) 25 MCG (1000 UNIT) tablet Take 1,000 Units by mouth daily.     clobetasol ointment (TEMOVATE) 0.05 % Apply 1 Application topically 2 (two) times daily.     colchicine  0.6 MG tablet Take 0.6 mg by mouth 2 (two) times daily as needed.     diclofenac Sodium (VOLTAREN) 1 % GEL Apply 2 g topically 4 (four) times daily.     glimepiride (AMARYL) 1 MG tablet Take 1 mg by mouth every morning.     loratadine  (CLARITIN ) 10 MG tablet Take 1 tablet (10 mg total) by mouth daily. 30 tablet 11   Magnesium  400 MG TABS Take 400  mg by mouth daily. 90 tablet 0   metFORMIN (GLUCOPHAGE-XR) 500 MG 24 hr tablet Take 500 mg by mouth daily with breakfast.     metoprolol  succinate (TOPROL -XL) 25 MG 24 hr tablet Take 1 tablet (25 mg total) by mouth daily. 30 tablet 1   pantoprazole  (PROTONIX ) 40 MG tablet Take 1 tablet by mouth daily.     vitamin B-12 (CYANOCOBALAMIN) 1000 MCG tablet Take 1,000 mcg by mouth daily.     losartan (COZAAR) 100 MG tablet Take 100 mg by mouth daily. (Patient not taking: Reported on 11/22/2024)     omeprazole (PRILOSEC) 20 MG capsule Take 20 mg by mouth daily. (Patient not taking: Reported on 11/22/2024)     predniSONE (DELTASONE) 20 MG tablet Take 20 mg by mouth daily. (Patient not taking: Reported on 11/22/2024)     No current facility-administered medications for this visit.    Past Medical History:  Diagnosis Date   Arthritis    Back pain    Coronary artery disease    Diabetes mellitus without complication (HCC)    GERD (gastroesophageal reflux disease)    History of hiatal hernia    Hyperlipidemia    Hypertension    Sleep apnea     Past Surgical History:  Procedure Laterality Date  APPENDECTOMY     CARDIAC CATHETERIZATION     CHOLECYSTECTOMY     COLONOSCOPY WITH ESOPHAGOGASTRODUODENOSCOPY (EGD)     COLONOSCOPY WITH PROPOFOL  N/A 03/16/2016   Procedure: COLONOSCOPY WITH PROPOFOL ;  Surgeon: Gladis RAYMOND Mariner, MD;  Location: [facility] ENDOSCOPY;  Service: Endoscopy;  Laterality: N/A;   ESOPHAGOGASTRODUODENOSCOPY (EGD) WITH PROPOFOL  N/A 02/28/2020   Procedure: ESOPHAGOGASTRODUODENOSCOPY (EGD) WITH PROPOFOL ;  Surgeon: Toledo, Ladell POUR, MD;  Location: [facility] ENDOSCOPY;  Service: Gastroenterology;  Laterality: N/A;   EYE SURGERY     HERNIA REPAIR     LOWER EXTREMITY ANGIOGRAPHY Left 10/09/2019   Procedure: LOWER EXTREMITY ANGIOGRAPHY;  Surgeon: Marea Selinda RAMAN, MD;  Location: [facility] INVASIVE CV LAB;  Service: Cardiovascular;  Laterality: Left;   LOWER EXTREMITY ANGIOGRAPHY Left 02/10/2021   Procedure:  LOWER EXTREMITY ANGIOGRAPHY;  Surgeon: Marea Selinda RAMAN, MD;  Location: [facility] INVASIVE CV LAB;  Service: Cardiovascular;  Laterality: Left;   NISSEN FUNDOPLICATION     testicular vein surgery     TONSILLECTOMY     TOTAL KNEE ARTHROPLASTY       Social History[1]    Family History  Problem Relation Age of Onset   Varicose Veins Neg Hx    Vision loss Neg Hx    Stroke Neg Hx      Allergies[2]   REVIEW OF SYSTEMS (Negative unless checked)   Constitutional: [] Weight loss  [] Fever  [] Chills Cardiac: [] Chest pain   [] Chest pressure   [] Palpitations   [] Shortness of breath when laying flat   [] Shortness of breath at rest   [] Shortness of breath with exertion. Vascular:  [] Pain in legs with walking   [] Pain in legs at rest   [] Pain in legs when laying flat   [] Claudication   [] Pain in feet when walking  [x] Pain in feet at rest  [] Pain in feet when laying flat   [] History of DVT   [] Phlebitis   [] Swelling in legs   [] Varicose veins   [] Non-healing ulcers Pulmonary:   [] Uses home oxygen   [] Productive cough   [] Hemoptysis   [] Wheeze  [] COPD   [] Asthma Neurologic:  [] Dizziness  [] Blackouts   [] Seizures   [] History of stroke   [] History of TIA  [] Aphasia   [] Temporary blindness   [] Dysphagia   [] Weakness or numbness in arms   [] Weakness or numbness in legs Musculoskeletal:  [x] Arthritis   [] Joint swelling   [x] Joint pain   [x] Low back pain Hematologic:  [] Easy bruising  [] Easy bleeding   [] Hypercoagulable state   [] Anemic   Gastrointestinal:  [] Blood in stool   [] Vomiting blood  [x] Gastroesophageal reflux/heartburn   [] Abdominal pain Genitourinary:  [] Chronic kidney disease   [] Difficult urination  [] Frequent urination  [] Burning with urination   [] Hematuria Skin:  [] Rashes   [] Ulcers   [] Wounds Psychological:  [] History of anxiety   []  History of major depression.  Physical Examination  BP 135/79 (BP Location: Right Arm)   Pulse 75   Resp 18   Wt 164 lb 3.2 oz (74.5 kg)   BMI 24.25 kg/m   Gen:  WD/WN, NAD. Appears younger than stated age. Head: [place]/AT, No temporalis wasting. Ear/Nose/Throat: Hearing grossly intact, nares w/o erythema or drainage Eyes: Conjunctiva clear. Sclera non-icteric Neck: Supple.  Trachea midline Pulmonary:  Good air movement, no use of accessory muscles.  Cardiac: RRR, no JVD Vascular:  Vessel Right Left  Radial Palpable Palpable  PT Palpable Palpable  DP Palpable Palpable   Gastrointestinal: soft, non-tender/non-distended. No guarding/reflex.  Musculoskeletal: M/S 5/5 throughout.  No deformity or atrophy. No edema. Neurologic: Sensation grossly intact in extremities.  Symmetrical.  Speech is fluent.  Psychiatric: Judgment intact, Mood & affect appropriate for pt's clinical situation. Dermatologic: No rashes or ulcers noted.  No cellulitis or open wounds.  Physical Exam     Labs Recent Results (from the past 2160 hours)  VAS US  ABI WITH/WO TBI     Status: None   Collection Time: 11/22/24  9:35 AM  Result Value Ref Range   Right ABI 1.19    Left ABI 1.21     Radiology VAS US  ABI WITH/WO TBI Result Date: 11/23/2024  LOWER EXTREMITY DOPPLER STUDY Patient Name:  Bobby GREMILLION [name]  Date of Exam:   11/22/2024 Medical Rec #: [id number]            Accession #:    [id number] Date of Birth: 03/17/1941           Patient Gender: M Patient Age:   54 years Exam Location:  [facility] Procedure:      VAS US  ABI WITH/WO TBI Referring Phys: ORVIN DARING --------------------------------------------------------------------------------  Indications: Ulceration, and peripheral artery disease. High Risk Factors: Hypertension. Other Factors: CKD stage 3.  Vascular Interventions: 10/09/2019 PTA of Lt prox ATA. Lt popliteal A, Lt PTA.                         Lt popliteal A stent.                         02/10/2021 PTA of ATA, Lt proximal popliteal A and dist.                         SFA. Comparison Study: 06/2024 Performing  Technologist: Jerel Croak RVT  Examination Guidelines: A complete evaluation includes at minimum, Doppler waveform signals and systolic blood pressure reading at the level of bilateral brachial, anterior tibial, and posterior tibial arteries, when vessel segments are accessible. Bilateral testing is considered an integral part of a complete examination. Photoelectric Plethysmograph (PPG) waveforms and toe systolic pressure readings are included as required and additional duplex testing as needed. Limited examinations for reoccurring indications may be performed as noted.  ABI Findings: +---------+------------------+-----+---------+--------+ Right    Rt Pressure (mmHg)IndexWaveform Comment  +---------+------------------+-----+---------+--------+ Brachial 151                                      +---------+------------------+-----+---------+--------+ ATA      152               1.01 triphasic         +---------+------------------+-----+---------+--------+ PTA      179               1.19 biphasic          +---------+------------------+-----+---------+--------+ Great Toe126               0.83 Abnormal          +---------+------------------+-----+---------+--------+ +---------+------------------+-----+--------+-------+ Left     Lt Pressure (mmHg)IndexWaveformComment +---------+------------------+-----+--------+-------+ Brachial 149                                    +---------+------------------+-----+--------+-------+  ATA      170               1.13 biphasic        +---------+------------------+-----+--------+-------+ PTA      182               1.21 biphasic        +---------+------------------+-----+--------+-------+ Great Toe87                0.58 Abnormal        +---------+------------------+-----+--------+-------+ +-------+-----------+-----------+------------+------------+ ABI/TBIToday's ABIToday's TBIPrevious ABIPrevious TBI  +-------+-----------+-----------+------------+------------+ Right  1.19       .83        NonComp     .61          +-------+-----------+-----------+------------+------------+ Left   1.21       .58        1.24        .62          +-------+-----------+-----------+------------+------------+ Right TBIs appear increased compared to prior study on 06/2024.  Summary: Right: Resting right ankle-brachial index is within normal range. The right toe-brachial index is normal.  Left: Resting left ankle-brachial index is within normal range. The left toe-brachial index is abnormal.  *See table(s) above for measurements and observations.  Electronically signed by Selinda Gu MD on 11/23/2024 at 7:07:35 AM.    Final    VAS US  LOWER EXTREMITY ARTERIAL DUPLEX Result Date: 11/23/2024 LOWER EXTREMITY ARTERIAL DUPLEX STUDY Patient Name:  Bobby MATTHEWS [name]  Date of Exam:   11/22/2024 Medical Rec #: [id number]            Accession #:    [id number] Date of Birth: April 29, 1941           Patient Gender: M Patient Age:   89 years Exam Location:  [facility] Procedure:      VAS US  LOWER EXTREMITY ARTERIAL DUPLEX Referring Phys: ORVIN DARING --------------------------------------------------------------------------------  Indications: Ulceration, and peripheral artery disease. High Risk Factors: Hypertension. Other Factors: CKD stage 3.  Vascular Interventions: 10/09/2019 PTA of Lt prox ATA. Lt popliteal A, Lt PTA.                         Lt popliteal A stent.                         02/10/2021 PTA of ATA, Lt proximal popliteal A and dist.                         SFA. Current ABI:            R = 1.24 ; l = 1.21 Comparison Study: 2018 Performing Technologist: Jerel Croak RVT  Examination Guidelines: A complete evaluation includes B-mode imaging, spectral Doppler, color Doppler, and power Doppler as needed of all accessible portions of each vessel. Bilateral testing is considered an integral part of a complete examination.  Limited examinations for reoccurring indications may be performed as noted.  +-----------+--------+-----+--------+----------+--------+ RIGHT      PSV cm/sRatioStenosisWaveform  Comments +-----------+--------+-----+--------+----------+--------+ CFA Mid    98                   triphasic          +-----------+--------+-----+--------+----------+--------+ DFA        53  biphasic           +-----------+--------+-----+--------+----------+--------+ SFA Prox   59                   biphasic           +-----------+--------+-----+--------+----------+--------+ SFA Mid    45                   biphasic           +-----------+--------+-----+--------+----------+--------+ SFA Distal 61                   biphasic           +-----------+--------+-----+--------+----------+--------+ POP Distal 47                   biphasic           +-----------+--------+-----+--------+----------+--------+ ATA Distal 21                   monophasic         +-----------+--------+-----+--------+----------+--------+ PTA Distal 87                   biphasic           +-----------+--------+-----+--------+----------+--------+ PERO Distal45                   monophasic         +-----------+--------+-----+--------+----------+--------+  +-----------+--------+-----+--------+----------+--------+ LEFT       PSV cm/sRatioStenosisWaveform  Comments +-----------+--------+-----+--------+----------+--------+ CFA Mid    69                   biphasic           +-----------+--------+-----+--------+----------+--------+ DFA        58                   biphasic           +-----------+--------+-----+--------+----------+--------+ SFA Prox   72                   biphasic           +-----------+--------+-----+--------+----------+--------+ SFA Mid    56                   biphasic           +-----------+--------+-----+--------+----------+--------+ SFA Distal 59                    biphasic           +-----------+--------+-----+--------+----------+--------+ POP Distal 48                   biphasic           +-----------+--------+-----+--------+----------+--------+ ATA Distal 33                   monophasic         +-----------+--------+-----+--------+----------+--------+ PTA Distal 59                   biphasic           +-----------+--------+-----+--------+----------+--------+ PERO Distal43                   biphasic           +-----------+--------+-----+--------+----------+--------+  Summary: Right: Patent LEA vessels throughout. Moderate atherosclerosis. Left: Patent LEA vessels throughout. Moderate atherosclerosis.  See table(s) above for measurements and observations. Electronically signed by Selinda Gu MD on 11/23/2024 at 7:06:53  AM.    Final     Assessment/Plan  Assessment & Plan Peripheral vascular disease Peripheral vascular disease is well-managed with fairly normal perfusion in both feet. Foot pain attributed to a callus. - Reviewed recent vascular studies showing normal blood flow in both feet.  His left digit pressure is very mildly reduced at 87, but duplex does not show any obvious significant stenosis and there is not much we can do to improve upon this. - Recommended use of padding to protect the callus and reduce pressure. - Advised follow-up with podiatry every three months for callus debridement and foot care. - Scheduled follow-up vascular evaluation in one year.  HTN (hypertension), benign blood pressure control important in reducing the progression of atherosclerotic disease. On appropriate oral medications.     Diabetes mellitus with stage 3 chronic kidney disease (HCC) blood glucose control important in reducing the progression of atherosclerotic disease. Also, involved in wound healing. On appropriate medications.     Renal failure, chronic, stage 3 (moderate) Limit contrast with procedures  requiring contrast and hydrate well if contrast is used.      Selinda Gu, MD  11/24/2024 10:40 AM    This note was created with Dragon medical transcription system.  Any errors from dictation are purely unintentional    [1]  Social History Tobacco Use   Smoking status: Former    Current packs/day: 0.00    Average packs/day: 2.0 packs/day for 20.0 years (40.0 ttl pk-yrs)    Types: Cigarettes    Start date: 34    Quit date: 1979    Years since quitting: 47.0   Smokeless tobacco: Never  Vaping Use   Vaping status: Never Used  Substance Use Topics   Alcohol use: Yes    Alcohol/week: 1.0 standard drink of alcohol    Types: 1 Cans of beer per week    Comment: occassional    Drug use: No  [2]  Allergies Allergen Reactions   Bee Pollen Cough   Pollen Extract Cough   Nsaids    Tolmetin    "

## 2024-11-27 ENCOUNTER — Ambulatory Visit: Payer: PRIVATE HEALTH INSURANCE | Admitting: Sleep Medicine

## 2024-11-28 ENCOUNTER — Ambulatory Visit: Admitting: Sleep Medicine

## 2024-11-28 ENCOUNTER — Encounter: Payer: Self-pay | Admitting: Sleep Medicine

## 2024-11-28 VITALS — BP 120/78 | HR 67 | Temp 98.4°F | Ht 69.0 in | Wt 165.0 lb

## 2024-11-28 DIAGNOSIS — Z87891 Personal history of nicotine dependence: Secondary | ICD-10-CM | POA: Diagnosis not present

## 2024-11-28 DIAGNOSIS — I10 Essential (primary) hypertension: Secondary | ICD-10-CM | POA: Diagnosis not present

## 2024-11-28 DIAGNOSIS — G4733 Obstructive sleep apnea (adult) (pediatric): Secondary | ICD-10-CM | POA: Diagnosis not present

## 2024-11-28 NOTE — Progress Notes (Signed)
 "       Name:Bobby L Ozturk [name] MRN: [id number] DOB: 03/01/1941   CHIEF COMPLAINT:  CPAP F/U   HISTORY OF PRESENT ILLNESS:  Bobby Warner is a 84 y.o. w/ a h/o OSA, hyperlipidemia, atrial fibrillation, HTN and GERD who presents for CPAP F/U visit. Reports wanting to restart CPAP therapy. States that he has not used CPAP therapy for several months due to significant mask discomfort and air leaks. Reports significant daytime sleepiness. States that he tried using CPAP therapy for the first time last night, since last year.    EPWORTH SLEEP SCORE 12    06/01/2024   11:00 AM  Results of the Epworth flowsheet  Sitting and reading 3  Watching TV 3  Sitting, inactive in a public place (e.g. a theatre or a meeting) 1  As a passenger in a car for an hour without a break 1  Lying down to rest in the afternoon when circumstances permit 3  Sitting and talking to someone 1  Sitting quietly after a lunch without alcohol 0  In a car, while stopped for a few minutes in traffic 0  Total score 12    PAST MEDICAL HISTORY :   has a past medical history of Arthritis, Back pain, Coronary artery disease, Diabetes mellitus without complication (HCC), GERD (gastroesophageal reflux disease), History of hiatal hernia, Hyperlipidemia, Hypertension, and Sleep apnea.  has a past surgical history that includes Eye surgery; Total knee arthroplasty; Tonsillectomy; Appendectomy; Cardiac catheterization; Hernia repair; testicular vein surgery; Cholecystectomy; Nissen fundoplication; Colonoscopy with esophagogastroduodenoscopy (egd); Colonoscopy with propofol  (N/A, 03/16/2016); Lower Extremity Angiography (Left, 10/09/2019); Esophagogastroduodenoscopy (egd) with propofol  (N/A, 02/28/2020); and Lower Extremity Angiography (Left, 02/10/2021). Prior to Admission medications   Medication Sig Start Date End Date Taking? Authorizing Provider  ACCU-CHEK GUIDE TEST test strip 1 each as needed.   Yes [provider]   acetaminophen  (TYLENOL ) 500 MG tablet Take 1,000 mg by mouth every 6 (six) hours as needed.   Yes [provider]  apixaban  (ELIQUIS ) 2.5 MG TABS tablet Take 1 tablet (2.5 mg total) by mouth 2 (two) times daily. 05/23/23  Yes Caleen Qualia, MD  aspirin  81 MG tablet Take 81 mg by mouth daily.   Yes [provider]  atorvastatin  (LIPITOR) 40 MG tablet Take 40 mg by mouth daily. 07/03/19  Yes [provider]  cholecalciferol (VITAMIN D3) 25 MCG (1000 UNIT) tablet Take 1,000 Units by mouth daily.   Yes [provider]  colchicine  0.6 MG tablet Take 0.6 mg by mouth 2 (two) times daily as needed. 05/30/19  Yes [provider]  glimepiride (AMARYL) 1 MG tablet Take 1 mg by mouth every morning.   Yes [provider]  loratadine  (CLARITIN ) 10 MG tablet Take 1 tablet (10 mg total) by mouth daily. 08/23/23 08/22/24 Yes Dgayli, Belva, MD  losartan (COZAAR) 100 MG tablet Take 100 mg by mouth daily. 06/18/19  Yes [provider]  Magnesium  400 MG TABS Take 400 mg by mouth daily. 05/23/23  Yes Amin, Sumayya, MD  metFORMIN (GLUCOPHAGE-XR) 500 MG 24 hr tablet Take 500 mg by mouth daily with breakfast.   Yes [provider]  metoprolol  succinate (TOPROL -XL) 25 MG 24 hr tablet Take 1 tablet (25 mg total) by mouth daily. 05/24/23  Yes Amin, Sumayya, MD  omeprazole (PRILOSEC) 20 MG capsule Take 20 mg by mouth daily.   Yes [provider]  pantoprazole  (PROTONIX ) 40 MG tablet Take 1 tablet by mouth daily. 09/28/23  Yes [provider]  predniSONE (DELTASONE) 20 MG tablet Take 20 mg by mouth daily. 04/21/24  Yes [provider]  vitamin B-12 (CYANOCOBALAMIN) 1000 MCG tablet Take 1,000 mcg by mouth daily.   Yes [provider]   Allergies  Allergen Reactions   Bee Pollen Cough   Pollen Extract Cough   Nsaids    Tolmetin     FAMILY HISTORY:  family history is not on file. SOCIAL HISTORY:  reports that he quit  smoking about 47 years ago. His smoking use included cigarettes. He started smoking about 67 years ago. He has a 40 pack-year smoking history. He has never used smokeless tobacco. He reports current alcohol use of about 1.0 standard drink of alcohol per week. He reports that he does not use drugs.   Review of Systems:  Gen:  Denies  fever, sweats, chills weight loss  HEENT: Denies blurred vision, double vision, ear pain, eye pain, hearing loss, nose bleeds, sore throat Cardiac:  No dizziness, chest pain or heaviness, chest tightness,edema, No JVD Resp:   No cough, -sputum production, -shortness of breath,-wheezing, -hemoptysis,  Gi: Denies swallowing difficulty, stomach pain, nausea or vomiting, diarrhea, constipation, bowel incontinence Gu:  Denies bladder incontinence, burning urine Ext:   Denies Joint pain, stiffness or swelling Skin: Denies  skin rash, easy bruising or bleeding or hives Endoc:  Denies polyuria, polydipsia , polyphagia or weight change Psych:   Denies depression, insomnia or hallucinations  Other:  All other systems negative  VITAL SIGNS: BP 120/78   Pulse 67   Temp 98.4 F (36.9 C)   Ht 5' 9 (1.753 m)   Wt 165 lb (74.8 kg)   SpO2 98%   BMI 24.37 kg/m    Physical Examination:   General Appearance: No distress  EYES PERRLA, EOM intact.   NECK Supple, No JVD Pulmonary: normal breath sounds, No wheezing.  CardiovascularNormal S1,S2.  No m/r/g.   Abdomen: Benign, Soft, non-tender. Skin:   warm, no rashes, no ecchymosis  Extremities: normal, no cyanosis, clubbing. Neuro:without focal findings,  speech normal  PSYCHIATRIC: Mood, affect within normal limits.   ASSESSMENT AND PLAN  OSA Restarting patient on CPAP therapy. To improve comfort, will try patient on the Airfit F30i FFM. Discussed the consequences of untreated sleep apnea. Advised not to drive drowsy for safety of patient and others. Will follow up in 3 months.    HTN Stable, on current  management. Following with PCP.    Patient  satisfied with Plan of action and management. All questions answered  I spent a total of 34 minutes reviewing chart data, face-to-face evaluation with the patient, counseling and coordination of care as detailed above.    Pranay Hilbun, M.D.  Sleep Medicine [facility]        "

## 2024-11-28 NOTE — Patient Instructions (Signed)

## 2024-11-30 NOTE — Addendum Note (Signed)
 Addended by: Medha Pippen on: 11/30/2024 01:00 PM   Modules accepted: Level of Service

## 2025-03-07 ENCOUNTER — Ambulatory Visit: Admitting: Sleep Medicine

## 2025-06-19 ENCOUNTER — Encounter (INDEPENDENT_AMBULATORY_CARE_PROVIDER_SITE_OTHER)

## 2025-06-19 ENCOUNTER — Ambulatory Visit (INDEPENDENT_AMBULATORY_CARE_PROVIDER_SITE_OTHER): Admitting: Vascular Surgery

## 2025-11-23 ENCOUNTER — Ambulatory Visit (INDEPENDENT_AMBULATORY_CARE_PROVIDER_SITE_OTHER): Admitting: Vascular Surgery

## 2025-11-23 ENCOUNTER — Encounter (INDEPENDENT_AMBULATORY_CARE_PROVIDER_SITE_OTHER)
